# Patient Record
Sex: Male | Born: 1955 | Race: White | Hispanic: No | Smoking: Current every day smoker
Health system: Southern US, Community
[De-identification: ages and names within clinical notes are randomized; demographics above are authoritative.]

## PROBLEM LIST (undated history)

## (undated) DIAGNOSIS — R918 Other nonspecific abnormal finding of lung field: Secondary | ICD-10-CM

## (undated) DIAGNOSIS — Z720 Tobacco use: Secondary | ICD-10-CM

## (undated) HISTORY — PX: TONSILLECTOMY: SUR1361

---

## 2006-06-07 ENCOUNTER — Emergency Department (HOSPITAL_COMMUNITY): Admission: EM | Admit: 2006-06-07 | Discharge: 2006-06-07 | Payer: Self-pay | Admitting: Emergency Medicine

## 2018-03-19 ENCOUNTER — Encounter (HOSPITAL_COMMUNITY): Payer: Self-pay

## 2018-03-19 ENCOUNTER — Inpatient Hospital Stay (HOSPITAL_COMMUNITY)
Admission: EM | Admit: 2018-03-19 | Discharge: 2018-03-20 | DRG: 640 | Disposition: A | Discharge status: Home / Self Care | Payer: Self-pay | Attending: Family Medicine | Admitting: Family Medicine

## 2018-03-19 ENCOUNTER — Emergency Department (HOSPITAL_COMMUNITY): Payer: Self-pay

## 2018-03-19 DIAGNOSIS — R64 Cachexia: Secondary | ICD-10-CM | POA: Diagnosis present

## 2018-03-19 DIAGNOSIS — E872 Acidosis, unspecified: Secondary | ICD-10-CM | POA: Diagnosis present

## 2018-03-19 DIAGNOSIS — N179 Acute kidney failure, unspecified: Secondary | ICD-10-CM | POA: Diagnosis present

## 2018-03-19 DIAGNOSIS — F17210 Nicotine dependence, cigarettes, uncomplicated: Secondary | ICD-10-CM | POA: Diagnosis present

## 2018-03-19 DIAGNOSIS — R002 Palpitations: Secondary | ICD-10-CM | POA: Diagnosis present

## 2018-03-19 DIAGNOSIS — Z681 Body mass index (BMI) 19 or less, adult: Secondary | ICD-10-CM

## 2018-03-19 DIAGNOSIS — E860 Dehydration: Principal | ICD-10-CM | POA: Diagnosis present

## 2018-03-19 DIAGNOSIS — E43 Unspecified severe protein-calorie malnutrition: Secondary | ICD-10-CM | POA: Diagnosis present

## 2018-03-19 DIAGNOSIS — R627 Adult failure to thrive: Secondary | ICD-10-CM | POA: Diagnosis present

## 2018-03-19 DIAGNOSIS — D539 Nutritional anemia, unspecified: Secondary | ICD-10-CM | POA: Diagnosis present

## 2018-03-19 DIAGNOSIS — E875 Hyperkalemia: Secondary | ICD-10-CM | POA: Diagnosis present

## 2018-03-19 LAB — ETHANOL

## 2018-03-19 LAB — I-STAT CG4 LACTIC ACID, ED: LACTIC ACID, VENOUS: 3.72 mmol/L — AB (ref 0.5–1.9)

## 2018-03-19 LAB — CBC WITH DIFFERENTIAL/PLATELET
Basophils Absolute: 0 10*3/uL (ref 0.0–0.1)
Basophils Relative: 0 %
EOS PCT: 0 %
Eosinophils Absolute: 0 10*3/uL (ref 0.0–0.7)
HCT: 35.1 % — ABNORMAL LOW (ref 39.0–52.0)
Hemoglobin: 11 g/dL — ABNORMAL LOW (ref 13.0–17.0)
LYMPHS ABS: 0.7 10*3/uL (ref 0.7–4.0)
LYMPHS PCT: 12 %
MCH: 26.4 pg (ref 26.0–34.0)
MCHC: 31.3 g/dL (ref 30.0–36.0)
MCV: 84.2 fL (ref 78.0–100.0)
MONO ABS: 0.7 10*3/uL (ref 0.1–1.0)
Monocytes Relative: 10 %
Neutro Abs: 5 10*3/uL (ref 1.7–7.7)
Neutrophils Relative %: 78 %
PLATELETS: 288 10*3/uL (ref 150–400)
RBC: 4.17 MIL/uL — ABNORMAL LOW (ref 4.22–5.81)
RDW: 19.4 % — AB (ref 11.5–15.5)
WBC: 6.4 10*3/uL (ref 4.0–10.5)

## 2018-03-19 LAB — COMPREHENSIVE METABOLIC PANEL
ALT: 9 U/L (ref 0–44)
ANION GAP: 25 — AB (ref 5–15)
AST: 18 U/L (ref 15–41)
Albumin: 2.4 g/dL — ABNORMAL LOW (ref 3.5–5.0)
Alkaline Phosphatase: 91 U/L (ref 38–126)
BUN: 5 mg/dL — ABNORMAL LOW (ref 8–23)
CHLORIDE: 92 mmol/L — AB (ref 98–111)
CO2: 18 mmol/L — ABNORMAL LOW (ref 22–32)
Calcium: 9.3 mg/dL (ref 8.9–10.3)
Creatinine, Ser: 1.43 mg/dL — ABNORMAL HIGH (ref 0.61–1.24)
GFR, EST AFRICAN AMERICAN: 58 mL/min — AB (ref >60)
GFR, EST NON AFRICAN AMERICAN: 50 mL/min — AB (ref >60)
Glucose, Bld: 111 mg/dL — ABNORMAL HIGH (ref 70–99)
POTASSIUM: 5.8 mmol/L — AB (ref 3.5–5.1)
Sodium: 135 mmol/L (ref 135–145)
Total Bilirubin: 3.3 mg/dL — ABNORMAL HIGH (ref 0.3–1.2)
Total Protein: 7.9 g/dL (ref 6.5–8.1)

## 2018-03-19 LAB — TROPONIN I: Troponin I: <0.03 ng/mL (ref <0.03)

## 2018-03-19 MED ORDER — ONDANSETRON HCL 4 MG PO TABS: 4.0000 mg | ORAL_TABLET | Freq: Four times a day (QID) | ORAL | Status: DC | PRN

## 2018-03-19 MED ORDER — SODIUM CHLORIDE 0.9 % IV BOLUS
500.0000 mL | Freq: Once | INTRAVENOUS | Status: AC
  Administered 2018-03-19: 500 mL via INTRAVENOUS

## 2018-03-19 MED ORDER — DOCUSATE SODIUM 100 MG PO CAPS
100.0000 mg | ORAL_CAPSULE | Freq: Two times a day (BID) | ORAL | Status: DC
  Filled 2018-03-19: qty 1

## 2018-03-19 MED ORDER — SODIUM CHLORIDE 0.9 % IV SOLN
INTRAVENOUS | Status: DC
  Administered 2018-03-20 (×2): via INTRAVENOUS

## 2018-03-19 MED ORDER — THIAMINE HCL 100 MG/ML IJ SOLN
Freq: Once | INTRAVENOUS | Status: AC
  Administered 2018-03-20: via INTRAVENOUS
  Filled 2018-03-19: qty 1000

## 2018-03-19 MED ORDER — ONDANSETRON HCL 4 MG/2ML IJ SOLN: 4.0000 mg | Freq: Four times a day (QID) | INTRAMUSCULAR | Status: DC | PRN

## 2018-03-19 MED ORDER — SODIUM CHLORIDE 0.9 % IV BOLUS
1000.0000 mL | Freq: Once | INTRAVENOUS | Status: AC
  Administered 2018-03-19: 1000 mL via INTRAVENOUS

## 2018-03-19 MED ORDER — ENOXAPARIN SODIUM 30 MG/0.3ML ~~LOC~~ SOLN
30.0000 mg | SUBCUTANEOUS | Status: DC
  Administered 2018-03-20: 30 mg via SUBCUTANEOUS
  Filled 2018-03-19: qty 0.3

## 2018-03-19 NOTE — ED Triage Notes (Signed)
Pt arrived by EMS from home. Pt reports that for the past couple months he has lost a lot of weight and has been having weakness. Pt also reports loss of appetite. Pt alert and oriented on arrival. Pt denies having any pain at this time.

## 2018-03-19 NOTE — ED Provider Notes (Signed)
[facility] DEPT Provider Note   CSN: [id number] Arrival date & time: 03/19/18  1933     History   Chief Complaint Chief Complaint  Patient presents with  . Failure To Thrive    HPI Warren Blankenship is a 62 y.o. male.  HPI Patient presents with generalized weakness and 20 pound weight loss over the past few months.  States he has a cough which occasionally produces blood.  Denies any chest pain.  No definite shortness of breath.  Patient lives alone and does not have prior medical history.  Does report 1 pack/day smoking history. History reviewed. No pertinent past medical history.  Patient Active Problem List   Diagnosis Date Noted  . ARF (acute renal failure) ([facility]) 03/19/2018  . FTT (failure to thrive) in adult 03/19/2018  . Hyperkalemia 03/19/2018  . Cachexia ([facility]) 03/19/2018  . Lactic acidosis 03/19/2018  . Palpitation 03/19/2018    History reviewed. No pertinent surgical history.      Home Medications    Prior to Admission medications   Medication Sig Start Date End Date Taking? Authorizing Provider  naproxen sodium (ALEVE) 220 MG tablet Take 440 mg by mouth daily as needed (pain).   Yes [provider]    Family History History reviewed. No pertinent family history.  Social History Social History   Tobacco Use  . Smoking status: Current Every Day Smoker    Packs/day: 1.00  . Smokeless tobacco: Never Used  Substance Use Topics  . Alcohol use: Not Currently  . Drug use: Not Currently     Allergies   Patient has no known allergies.   Review of Systems Review of Systems  Constitutional: Positive for activity change, appetite change and fatigue. Negative for chills and fever.  HENT: Negative for sore throat and trouble swallowing.   Eyes: Negative for photophobia and visual disturbance.  Respiratory: Positive for cough. Negative for shortness of breath.   Cardiovascular: Negative for chest pain, palpitations  and leg swelling.  Gastrointestinal: Negative for abdominal pain, constipation, diarrhea, nausea and vomiting.  Genitourinary: Negative for dysuria, flank pain and frequency.  Musculoskeletal: Negative for back pain, myalgias, neck pain and neck stiffness.  Skin: Negative for rash and wound.  Neurological: Positive for weakness. Negative for dizziness, light-headedness, numbness and headaches.  All other systems reviewed and are negative.    Physical Exam Updated Vital Signs BP 92/73 (BP Location: Right Arm)   Pulse (!) 117   Temp 97.9 F (36.6 C) (Oral)   Resp 16   Ht 5\' 6"  (1.676 m)   Wt 45.4 kg   SpO2 100%   BMI 16.14 kg/m   Physical Exam  Constitutional: He is oriented to person, place, and time. He appears well-developed. No distress.  Cachectic and pale  HENT:  Head: Normocephalic and atraumatic.  Mouth/Throat: Oropharynx is clear and moist. No oropharyngeal exudate.  Eyes: Pupils are equal, round, and reactive to light. EOM are normal.  Neck: Normal range of motion. Neck supple. No JVD present.  Cardiovascular: Regular rhythm.  Tachycardia  Pulmonary/Chest: Effort normal.  Diminished breath sounds in the lung field with scattered rhonchi.  Abdominal: Soft. Bowel sounds are normal. There is no tenderness. There is no rebound and no guarding.  Musculoskeletal: Normal range of motion. He exhibits no edema or tenderness.  Lymphadenopathy:    He has no cervical adenopathy.  Neurological: He is alert and oriented to person, place, and time.  Moves all extremities without focal deficit.  Sensation intact.  Skin: Skin is warm and dry. Capillary refill takes less than 2 seconds. No rash noted. He is not diaphoretic. No erythema.  Psychiatric: He has a normal mood and affect. His behavior is normal.  Nursing note and vitals reviewed.    ED Treatments / Results  Labs (all labs ordered are listed, but only abnormal results are displayed) Labs Reviewed  CBC WITH  DIFFERENTIAL/PLATELET - Abnormal; Notable for the following components:      Result Value   RBC 4.17 (*)    Hemoglobin 11.0 (*)    HCT 35.1 (*)    RDW 19.4 (*)    All other components within normal limits  COMPREHENSIVE METABOLIC PANEL - Abnormal; Notable for the following components:   Potassium 5.8 (*)    Chloride 92 (*)    CO2 18 (*)    Glucose, Bld 111 (*)    BUN 5 (*)    Creatinine, Ser 1.43 (*)    Albumin 2.4 (*)    Total Bilirubin 3.3 (*)    GFR calc non Af Amer 50 (*)    GFR calc Af Amer 58 (*)    Anion gap 25 (*)    All other components within normal limits  I-STAT CG4 LACTIC ACID, ED - Abnormal; Notable for the following components:   Lactic Acid, Venous 3.72 (*)    All other components within normal limits  CULTURE, BLOOD (ROUTINE X 2)  CULTURE, BLOOD (ROUTINE X 2)  TROPONIN I  ETHANOL  RAPID URINE DRUG SCREEN, HOSP PERFORMED    EKG None  Radiology Dg Chest 2 View  Result Date: 03/19/2018 CLINICAL DATA:  Cough EXAM: CHEST - 2 VIEW COMPARISON:  None. FINDINGS: Hyperinflation with emphysematous disease. No focal airspace disease or effusion. Normal heart size. Aortic atherosclerosis. No pneumothorax IMPRESSION: Hyperinflation with emphysema.  No focal pulmonary infiltrate. Electronically Signed   By: Donavan Foil M.D.   On: 03/19/2018 20:55    Procedures Procedures (including critical care time)  Medications Ordered in ED Medications  sodium chloride 0.9 % bolus 1,000 mL (0 mLs Intravenous Stopped 03/19/18 2111)  sodium chloride 0.9 % bolus 500 mL (0 mLs Intravenous Stopped 03/19/18 2147)   CRITICAL CARE Performed by: Julianne Rice Total critical care time: 25 minutes Critical care time was exclusive of separately billable procedures and treating other patients. Critical care was necessary to treat or prevent imminent or life-threatening deterioration. Critical care was time spent personally by me on the following activities: development of treatment plan  with patient and/or surrogate as well as nursing, discussions with consultants, evaluation of patient's response to treatment, examination of patient, obtaining history from patient or surrogate, ordering and performing treatments and interventions, ordering and review of laboratory studies, ordering and review of radiographic studies, pulse oximetry and re-evaluation of patient's condition.  Initial Impression / Assessment and Plan / ED Course  I have reviewed the triage vital signs and the nursing notes.  Pertinent labs & imaging results that were available during my care of the patient were reviewed by me and considered in my medical decision making (see chart for details).     Patient appears severely malnourished.  Wasted appearing.  Elevated lactic acid.  Given 30 cc/kg of IV fluids.  Improvement of tachycardia and blood pressure.  Has normal white blood cell count.  No evidence of infectious process.  Think lactic acidosis most likely due to dehydration.  Suspicion for underlying cancer.  Discussed with hospitalist will see patient in  the emergency department and admit.  Final Clinical Impressions(s) / ED Diagnoses   Final diagnoses:  Dehydration  Lactic acidosis  Failure to thrive in adult    ED Discharge Orders    None       Julianne Rice, MD 03/19/18 2214

## 2018-03-19 NOTE — ED Notes (Signed)
ED Provider at bedside. 

## 2018-03-19 NOTE — ED Notes (Signed)
Bed: [facility] Expected date:  Expected time:  Means of arrival:  Comments: 

## 2018-03-19 NOTE — H&P (Signed)
History and Physical    Warren Blankenship ZDG:[id number] DOB: [birth year] DOA: 03/19/2018  Referring MD/NP/PA: Dr Lita Mains  PCP: Patient, No Pcp Per   Outpatient Specialists: None   Patient coming from: Home  Chief Complaint: Weakness and weight loss  HPI: Warren Blankenship is a 62 y.o. male with medical history significant of no known medical problems who presented with a 20 pound weight loss, significant weakness, progressive cough with shortness of breath, hemoptysis in the setting of chronic tobacco abuse.  Patient also has had poor oral intake and appears extremely dehydrated, malnourished and has lactic acidosis probably due to dehydration.  He has acute kidney injury as well as hyperkalemia with potassium of 5.8.  Patient is a poor historian and denied any past medical history.  Patient is dischargeable and appears to be chronically ill.  Patient's chest x-ray showed no evidence of any mass.  He is not hypoxic.  ED Course: Temperature is 98 with blood pressure 92/73 pulse 117 respirate of 16 oxygen sat 100% room air.  Chest x-ray is showing emphysema with no obvious infiltrate or mass.  White count is 6.4 hemoglobin 11.0 and platelet 288.  Sodium 135 potassium 5.8 chloride 92 CO2 of 18 with a BUN of 5 and creatinine 1.43 calcium is 9.3 and lactic acid of 3.72.  Patient has glucose of 111.  Review of Systems: As per HPI otherwise 10 point review of systems negative.   History reviewed. No pertinent past medical history.  History reviewed. No pertinent surgical history.   reports that he has been smoking. He has been smoking about 1.00 pack per day. He has never used smokeless tobacco. He reports that he drank alcohol. He reports that he has current or past drug history.  No Known Allergies  History reviewed. No pertinent family history.  Prior to Admission medications   Medication Sig Start Date End Date Taking? Authorizing Provider  naproxen sodium (ALEVE) 220 MG tablet Take 440 mg  by mouth daily as needed (pain).   Yes [provider]    Physical Exam: Vitals:   03/19/18 1957  BP: 92/73  Pulse: (!) 117  Resp: 16  Temp: 97.9 F (36.6 C)  TempSrc: Oral  SpO2: 100%  Weight: 45.4 kg  Height: 5\' 6"  (1.676 m)      Constitutional: NAD, calm, comfortable Vitals:   03/19/18 1957  BP: 92/73  Pulse: (!) 117  Resp: 16  Temp: 97.9 F (36.6 C)  TempSrc: Oral  SpO2: 100%  Weight: 45.4 kg  Height: 5\' 6"  (1.676 m)    Chronically ill looking, cachectic, very dry looking Eyes: PERRL, lids and conjunctivae normal, dry mucous membrane ENMT: Mucous membranes are moist. Posterior pharynx clear of any exudate or lesions.Normal dentition.  Neck: normal, supple, no masses, no thyromegaly Respiratory: clear to auscultation bilaterally, no wheezing, no crackles. Normal respiratory effort. No accessory muscle use.  Cardiovascular: Regular rate and rhythm, no murmurs / rubs / gallops. No extremity edema. 2+ pedal pulses. No carotid bruits.  Abdomen: no tenderness, no masses palpated. No hepatosplenomegaly. Bowel sounds positive.  Musculoskeletal: no clubbing / cyanosis. No joint deformity upper and lower extremities. Good ROM, no contractures. Normal muscle tone.  Skin: no rashes, lesions, ulcers. No induration Neurologic: CN 2-12 grossly intact. Sensation intact, DTR normal. Strength 5/5 in all 4.  Psychiatric: Normal judgment and insight. Alert and oriented x 3. Normal mood.   Labs on Admission: I have personally reviewed following labs and imaging studies  CBC:  Recent Labs  Lab 03/19/18 2000  WBC 6.4  NEUTROABS 5.0  HGB 11.0*  HCT 35.1*  MCV 84.2  PLT 762   Basic Metabolic Panel: Recent Labs  Lab 03/19/18 2000  NA 135  K 5.8*  CL 92*  CO2 18*  GLUCOSE 111*  BUN 5*  CREATININE 1.43*  CALCIUM 9.3   GFR: Estimated Creatinine Clearance: 33.5 mL/min (A) (by C-G formula based on SCr of 1.43 mg/dL (H)). Liver Function Tests: Recent Labs  Lab  03/19/18 2000  AST 18  ALT 9  ALKPHOS 91  BILITOT 3.3*  PROT 7.9  ALBUMIN 2.4*   No results for input(s): LIPASE, AMYLASE in the last 168 hours. No results for input(s): AMMONIA in the last 168 hours. Coagulation Profile: No results for input(s): INR, PROTIME in the last 168 hours. Cardiac Enzymes: Recent Labs  Lab 03/19/18 2000  TROPONINI <0.03   BNP (last 3 results) No results for input(s): PROBNP in the last 8760 hours. HbA1C: No results for input(s): HGBA1C in the last 72 hours. CBG: No results for input(s): GLUCAP in the last 168 hours. Lipid Profile: No results for input(s): CHOL, HDL, LDLCALC, TRIG, CHOLHDL, LDLDIRECT in the last 72 hours. Thyroid Function Tests: No results for input(s): TSH, T4TOTAL, FREET4, T3FREE, THYROIDAB in the last 72 hours. Anemia Panel: No results for input(s): VITAMINB12, FOLATE, FERRITIN, TIBC, IRON, RETICCTPCT in the last 72 hours. Urine analysis: No results found for: COLORURINE, APPEARANCEUR, LABSPEC, PHURINE, GLUCOSEU, HGBUR, BILIRUBINUR, KETONESUR, PROTEINUR, UROBILINOGEN, NITRITE, LEUKOCYTESUR Sepsis Labs: @LABRCNTIP (procalcitonin:4,lacticidven:4) )No results found for this or any previous visit (from the past 240 hour(s)).   Radiological Exams on Admission: Dg Chest 2 View  Result Date: 03/19/2018 CLINICAL DATA:  Cough EXAM: CHEST - 2 VIEW COMPARISON:  None. FINDINGS: Hyperinflation with emphysematous disease. No focal airspace disease or effusion. Normal heart size. Aortic atherosclerosis. No pneumothorax IMPRESSION: Hyperinflation with emphysema.  No focal pulmonary infiltrate. Electronically Signed   By: Donavan Foil M.D.   On: 03/19/2018 20:55    EKG: Independently reviewed.  It shows normal sinus rhythm with lateral ST flattening and some mild depressions but no old EKG to compare.  Possible LVH and RVH  Assessment/Plan Active Problems:   ARF (acute renal failure) ([facility])   FTT (failure to thrive) in adult   Hyperkalemia    Cachexia ([facility])   Lactic acidosis   Palpitation    #1 acute kidney injury: Most likely due to dehydration.  Patient appears very dry and so prerenal as a team is suspected.  We will aggressively hydrate the patient.  Monitor closely.  Patient has been taking nonsteroidal anti-inflammatory agents in the form of naproxen.  This could have contributed as well.  We will discontinue any nephrotoxic medications.  #2 hyperkalemia: potassium is 5.8 probably secondary to acute kidney injury.  Hydrate the patient recheck potassium level especially in the morning.  If is still elevated will give Kayexalate.  #3 profound cachexia: Patient reported a 20 pound weight loss in the last few months.  He could have some malignancy somewhere or poor oral intake could be contributing.  We will consider CT chest and abdomen to evaluate for this weight loss.  #4 lactic acidosis: Probably due to severe dehydration.  Continue to monitor  #5 normocytic anemia: Probably nutritional in nature.  Patient could still have malignancy somewhere.  We will monitor H&H especially after hydration.  #6 palpitations: Most likely due to dehydration.  Monitor heart rate was aggressive hydration.   DVT  prophylaxis: Lovenox Code Status: Full Code  Family Communication: No family available  Disposition Plan: To be determined  Consults called: None  Admission status: inpatient  Severity of Illness: The appropriate patient status for this patient is INPATIENT. Inpatient status is judged to be reasonable and necessary in order to provide the required intensity of service to ensure the patient's safety. The patient's presenting symptoms, physical exam findings, and initial radiographic and laboratory data in the context of their chronic comorbidities is felt to place them at high risk for further clinical deterioration. Furthermore, it is not anticipated that the patient will be medically stable for discharge from the hospital within 2  midnights of admission. The following factors support the patient status of inpatient.   " The patient's presenting symptoms include weakness and SOB. " The worrisome physical exam findings include cachexia, . " The initial radiographic and laboratory data are worrisome because of Lactic acid of 3.72. " The chronic co-morbidities include No known medical probl;ems.   * I certify that at the point of admission it is my clinical judgment that the patient will require inpatient hospital care spanning beyond 2 midnights from the point of admission due to high intensity of service, high risk for further deterioration and high frequency of surveillance required.Barbette Merino MD [facility] Pager [phone]  If 7PM-7AM, please contact night-coverage [email or web address] Password [email or web address]  03/19/2018, 10:09 PM

## 2018-03-20 DIAGNOSIS — E875 Hyperkalemia: Secondary | ICD-10-CM

## 2018-03-20 DIAGNOSIS — E872 Acidosis: Secondary | ICD-10-CM

## 2018-03-20 DIAGNOSIS — N179 Acute kidney failure, unspecified: Secondary | ICD-10-CM

## 2018-03-20 DIAGNOSIS — R634 Abnormal weight loss: Secondary | ICD-10-CM

## 2018-03-20 LAB — HIV ANTIBODY (ROUTINE TESTING W REFLEX): HIV Screen 4th Generation wRfx: NONREACTIVE

## 2018-03-20 LAB — TSH: TSH: 1.573 u[IU]/mL (ref 0.350–4.500)

## 2018-03-20 LAB — CBC
HEMATOCRIT: 29.1 % — AB (ref 39.0–52.0)
Hemoglobin: 9 g/dL — ABNORMAL LOW (ref 13.0–17.0)
MCH: 25.9 pg — ABNORMAL LOW (ref 26.0–34.0)
MCHC: 30.9 g/dL (ref 30.0–36.0)
MCV: 83.6 fL (ref 78.0–100.0)
Platelets: 259 10*3/uL (ref 150–400)
RBC: 3.48 MIL/uL — ABNORMAL LOW (ref 4.22–5.81)
RDW: 19.2 % — AB (ref 11.5–15.5)
WBC: 4.5 10*3/uL (ref 4.0–10.5)

## 2018-03-20 LAB — COMPREHENSIVE METABOLIC PANEL
ALBUMIN: 2 g/dL — AB (ref 3.5–5.0)
ALK PHOS: 77 U/L (ref 38–126)
ALT: 6 U/L (ref 0–44)
AST: 13 U/L — AB (ref 15–41)
Anion gap: 17 — ABNORMAL HIGH (ref 5–15)
BILIRUBIN TOTAL: 1.3 mg/dL — AB (ref 0.3–1.2)
BUN: 5 mg/dL — AB (ref 8–23)
CO2: 21 mmol/L — ABNORMAL LOW (ref 22–32)
Calcium: 8.5 mg/dL — ABNORMAL LOW (ref 8.9–10.3)
Chloride: 97 mmol/L — ABNORMAL LOW (ref 98–111)
Creatinine, Ser: 0.94 mg/dL (ref 0.61–1.24)
GFR calc Af Amer: >60 mL/min (ref >60)
GFR calc non Af Amer: >60 mL/min (ref >60)
GLUCOSE: 71 mg/dL (ref 70–99)
POTASSIUM: 2.9 mmol/L — AB (ref 3.5–5.1)
Sodium: 135 mmol/L (ref 135–145)
TOTAL PROTEIN: 7.1 g/dL (ref 6.5–8.1)

## 2018-03-20 MED ORDER — POTASSIUM CHLORIDE CRYS ER 20 MEQ PO TBCR
40.0000 meq | EXTENDED_RELEASE_TABLET | ORAL | Status: AC
  Administered 2018-03-20 (×2): 40 meq via ORAL
  Filled 2018-03-20 (×2): qty 2

## 2018-03-20 NOTE — Discharge Summary (Signed)
Physician Discharge Summary  Warren Blankenship  TWS:[id number]  DOB: 25-Apr-1956  DOA: 03/19/2018 PCP: Patient, No Pcp Per  Admit date: 03/19/2018 Discharge date: 03/20/2018  Admitted From: Home  Disposition:  Home   Recommendations for Outpatient Follow-up:  1. Follow up with PCP in 1 week to establish care and weight loss work up  2. Please obtain BMP/CBC in one week to monitor renal function and Hgb  3. Please follow up on the following pending results: Final blood cultures  4. Please arrange for outpatient colonoscopy and low-dose chest CT.  Discharge Condition: Stable   CODE STATUS: Full Code  Diet recommendation: Regular   Brief/Interim Summary: For full details see H&P/Progress note, but in brief, Warren Blankenship is a 62 year old male with no significant medical history presented to the emergency department complaining of weakness and weight loss.  Patient reported having 20 pound weight loss over the past 3-1/2 months and progressive shortness of breath with cough.  Patient report for oral intake and not hydrating himself properly.  Upon ED evaluation he appears to be extremely dehydrated, malnourished, lab work-up revealed lactic acidosis, hyperkalemia, elevated creatinine at 1.43, checks x-ray shows emphysema with no acute changes.  She was admitted with working diagnosis of AKI due to dehydration.  Subjective: Patient seen and examined, has no complaints, feeling much better. No acute events overnight. Cr normalized. K down and repleted.   Discharge Diagnoses/Hospital Course:  AKI with hyperkalemia In setting of poor oral intake Creatinine improved after IV hydration, potassium was low today after IV fluids, repleted with p.o. K Patient clinically improving. Avoid nephrotoxic agent, encourage oral hydration. Check BMP in 1 week to monitor renal function  Weight loss Unclear etiology at this time.  Patient has not seen a physician in many years.  Never had had an colonoscopy.   Patient is a smoker, however chest x-ray with no masses or visible nodules.  Patient will need outpatient work-up for weight loss including colonoscopy, low-dose chest CT.  Normocytic anemia Felt to be nutritional in nature, however will need further work-up as outpatient.  No signs of overt bleeding. Hemoglobin 9 upon discharge, hemodilution from aggressive IV fluid  Lactic acidosis Felt to be due to severe dehydration, resolved after IV fluids.  All other chronic medical condition were stable during the hospitalization.  On the day of the discharge the patient's vitals were stable, and no other acute medical condition were reported by patient. the patient was felt safe to be discharge to home.   Discharge Instructions  You were cared for by a hospitalist during your hospital stay. If you have any questions about your discharge medications or the care you received while you were in the hospital after you are discharged, you can call the unit and asked to speak with the hospitalist on call if the hospitalist that took care of you is not available. Once you are discharged, your primary care physician will handle any further medical issues. Please note that NO REFILLS for any discharge medications will be authorized once you are discharged, as it is imperative that you return to your primary care physician (or establish a relationship with a primary care physician if you do not have one) for your aftercare needs so that they can reassess your need for medications and monitor your lab values.  Discharge Instructions    Call MD for:  difficulty breathing, headache or visual disturbances   Complete by:  As directed    Call MD for:  extreme fatigue   Complete by:  As directed    Call MD for:  hives   Complete by:  As directed    Call MD for:  persistant dizziness or light-headedness   Complete by:  As directed    Call MD for:  persistant nausea and vomiting   Complete by:  As directed    Call MD  for:  redness, tenderness, or signs of infection (pain, swelling, redness, odor or green/yellow discharge around incision site)   Complete by:  As directed    Call MD for:  severe uncontrolled pain   Complete by:  As directed    Call MD for:  temperature >100.4   Complete by:  As directed    Diet - low sodium heart healthy   Complete by:  As directed    Increase activity slowly   Complete by:  As directed      Allergies as of 03/20/2018   No Known Allergies     Medication List    STOP taking these medications   naproxen sodium 220 MG tablet Commonly known as:  [place]. Schedule an appointment as soon as possible for a visit in 1 week(s).   Why:  Call to make an appointment as soon as possible  Contact information: [address] [place] [address] [phone]         No Known Allergies  Consultations:   Procedures/Studies: Dg Chest 2 View  Result Date: 03/19/2018 CLINICAL DATA:  Cough EXAM: CHEST - 2 VIEW COMPARISON:  None. FINDINGS: Hyperinflation with emphysematous disease. No focal airspace disease or effusion. Normal heart size. Aortic atherosclerosis. No pneumothorax IMPRESSION: Hyperinflation with emphysema.  No focal pulmonary infiltrate. Electronically Signed   By: Donavan Foil M.D.   On: 03/19/2018 20:55     Discharge Exam: Vitals:   03/19/18 2320 03/20/18 0509  BP: 110/75 90/60  Pulse: 82 82  Resp: 17 16  Temp: 98 F (36.7 C) 98.1 F (36.7 C)  SpO2: 100% 100%   Vitals:   03/19/18 2145 03/19/18 2215 03/19/18 2320 03/20/18 0509  BP: 121/82 115/76 110/75 90/60  Pulse:   82 82  Resp: 18 18 17 16   Temp:   98 F (36.7 C) 98.1 F (36.7 C)  TempSrc:   Oral Oral  SpO2:   100% 100%  Weight:      Height:        General: Cachetic, NAD   Cardiovascular: RRR, S1/S2 +, no rubs, no gallops Respiratory: CTA bilaterally, no wheezing, no rhonchi Abdominal:  Soft, NT, ND, + bowel sounds Extremities: no edema  The results of significant diagnostics from this hospitalization (including imaging, microbiology, ancillary and laboratory) are listed below for reference.     Microbiology: Recent Results (from the past 240 hour(s))  Culture, blood (Routine X 2) w Reflex to ID Panel     Status: None (Preliminary result)   Collection Time: 03/19/18  8:10 PM  Result Value Ref Range Status   Specimen Description   Final    BLOOD LEFT WRIST Performed at [facility] [address], [place], [place] [address]    Special Requests   Final    BOTTLES DRAWN AEROBIC AND ANAEROBIC Blood Culture results may not be optimal due to an inadequate volume of blood received in culture bottles Performed at [facility] [address] [place], [place]  [address]    Culture   Final    NO GROWTH < 24 HOURS Performed at [facility], [place] [address], [place], [place] [address]    Report Status PENDING  Incomplete  Culture, blood (Routine X 2) w Reflex to ID Panel     Status: None (Preliminary result)   Collection Time: 03/19/18  8:15 PM  Result Value Ref Range Status   Specimen Description   Final    BLOOD LEFT ANTECUBITAL Performed at [facility] [address] [place], [place] [address]    Special Requests   Final    BOTTLES DRAWN AEROBIC AND ANAEROBIC Blood Culture results may not be optimal due to an inadequate volume of blood received in culture bottles Performed at [facility] [address], [place], [place] [address]    Culture   Final    NO GROWTH < 24 HOURS Performed at [facility] [address], [place], [place] [address]    Report Status PENDING  Incomplete     Labs: BNP (last 3 results) No results for input(s): BNP in the last 8760 hours. Basic Metabolic Panel: Recent Labs  Lab 03/19/18 2000 03/20/18 0610  NA 135 135  K 5.8* 2.9*  CL 92* 97*   CO2 18* 21*  GLUCOSE 111* 71  BUN 5* 5*  CREATININE 1.43* 0.94  CALCIUM 9.3 8.5*   Liver Function Tests: Recent Labs  Lab 03/19/18 2000 03/20/18 0610  AST 18 13*  ALT 9 6  ALKPHOS 91 77  BILITOT 3.3* 1.3*  PROT 7.9 7.1  ALBUMIN 2.4* 2.0*   No results for input(s): LIPASE, AMYLASE in the last 168 hours. No results for input(s): AMMONIA in the last 168 hours. CBC: Recent Labs  Lab 03/19/18 2000 03/20/18 0610  WBC 6.4 4.5  NEUTROABS 5.0  --   HGB 11.0* 9.0*  HCT 35.1* 29.1*  MCV 84.2 83.6  PLT 288 259   Cardiac Enzymes: Recent Labs  Lab 03/19/18 2000  TROPONINI <0.03   BNP: Invalid input(s): POCBNP CBG: No results for input(s): GLUCAP in the last 168 hours. D-Dimer No results for input(s): DDIMER in the last 72 hours. Hgb A1c No results for input(s): HGBA1C in the last 72 hours. Lipid Profile No results for input(s): CHOL, HDL, LDLCALC, TRIG, CHOLHDL, LDLDIRECT in the last 72 hours. Thyroid function studies Recent Labs    03/20/18 0610  TSH 1.573   Anemia work up No results for input(s): VITAMINB12, FOLATE, FERRITIN, TIBC, IRON, RETICCTPCT in the last 72 hours. Urinalysis No results found for: COLORURINE, APPEARANCEUR, [place], [place], [place], [place], [place], [place], PROTEINUR, UROBILINOGEN, NITRITE, LEUKOCYTESUR Sepsis Labs Invalid input(s): PROCALCITONIN,  WBC,  LACTICIDVEN Microbiology Recent Results (from the past 240 hour(s))  Culture, blood (Routine X 2) w Reflex to ID Panel     Status: None (Preliminary result)   Collection Time: 03/19/18  8:10 PM  Result Value Ref Range Status   Specimen Description   Final    BLOOD LEFT WRIST Performed at [facility] [address] [place], [place] [address]    Special Requests   Final    BOTTLES DRAWN AEROBIC AND ANAEROBIC Blood Culture results may not be optimal due to an inadequate volume of blood received in culture bottles Performed at [facility] [address] [place], [place] [address]    Culture   Final    NO GROWTH < 24 HOURS Performed at [facility], [place] [address] [place], [place] [address]    Report Status PENDING  Incomplete  Culture, blood (Routine X 2) w Reflex to ID Panel     Status: None (Preliminary result)   Collection Time: 03/19/18  8:15 PM  Result Value Ref Range Status   Specimen Description   Final    BLOOD LEFT ANTECUBITAL Performed at [facility] [address] [place], [place] [address]    Special Requests   Final    BOTTLES DRAWN AEROBIC AND ANAEROBIC Blood Culture results may not be optimal due to an inadequate volume of blood received in culture bottles Performed at [facility] [address], [place], [place] [address]    Culture   Final    NO GROWTH < 24 HOURS Performed at [facility] [address], [place], [place] [address]    Report Status PENDING  Incomplete    Time coordinating discharge: 32 minutes  SIGNED:  Chipper Oman, MD  [facility] 03/20/2018, 2:01 PM  Pager please text page via  [email or web address]  Note - This record has been created using [employer]. Chart creation errors have been sought, but may not always have been located. Such creation errors do not reflect on the standard of medical care.

## 2018-03-20 NOTE — Care Management Note (Signed)
Case Management Note  Patient Details  Name: DEREL MCGLASSON MRN: [id number] Date of Birth: 29-Aug-1955  Subjective/Objective: [place] [facility] appt set-provided patient w/pcp listing-patient agreed to [facility]-provided w/community resources-[facility] med list. Reassured that he will get all medical attention @ [facility].Patient has own transp.                  Action/Plan:d/c home.   Expected Discharge Date:  03/20/18               Expected Discharge Plan:  Home/Self Care  In-House Referral:     Discharge planning Services  CM Consult, [facility], Medication Assistance  Post Acute Care Choice:    Choice offered to:     DME Arranged:    DME Agency:     HH Arranged:    HH Agency:     Status of Service:  Completed, signed off  If discussed at [employer], dates discussed:    Additional Comments:  Dessa Phi, RN 03/20/2018, 2:43 PM

## 2018-03-24 LAB — CULTURE, BLOOD (ROUTINE X 2)
CULTURE: NO GROWTH
CULTURE: NO GROWTH

## 2018-04-10 ENCOUNTER — Inpatient Hospital Stay: Payer: Self-pay | Admitting: Family Medicine

## 2018-04-11 ENCOUNTER — Other Ambulatory Visit: Payer: Self-pay

## 2018-04-11 ENCOUNTER — Emergency Department (HOSPITAL_COMMUNITY): Payer: Self-pay

## 2018-04-11 ENCOUNTER — Observation Stay (HOSPITAL_COMMUNITY)
Admission: EM | Admit: 2018-04-11 | Discharge: 2018-04-13 | Disposition: A | Discharge status: Home / Self Care | Payer: Self-pay | Attending: Internal Medicine | Admitting: Internal Medicine

## 2018-04-11 ENCOUNTER — Encounter (HOSPITAL_COMMUNITY): Payer: Self-pay | Admitting: Emergency Medicine

## 2018-04-11 DIAGNOSIS — R05 Cough: Secondary | ICD-10-CM

## 2018-04-11 DIAGNOSIS — F17210 Nicotine dependence, cigarettes, uncomplicated: Secondary | ICD-10-CM | POA: Insufficient documentation

## 2018-04-11 DIAGNOSIS — R634 Abnormal weight loss: Secondary | ICD-10-CM

## 2018-04-11 DIAGNOSIS — I959 Hypotension, unspecified: Secondary | ICD-10-CM | POA: Diagnosis present

## 2018-04-11 DIAGNOSIS — E876 Hypokalemia: Secondary | ICD-10-CM | POA: Insufficient documentation

## 2018-04-11 DIAGNOSIS — E871 Hypo-osmolality and hyponatremia: Secondary | ICD-10-CM | POA: Insufficient documentation

## 2018-04-11 DIAGNOSIS — R918 Other nonspecific abnormal finding of lung field: Principal | ICD-10-CM | POA: Insufficient documentation

## 2018-04-11 DIAGNOSIS — F17200 Nicotine dependence, unspecified, uncomplicated: Secondary | ICD-10-CM | POA: Diagnosis present

## 2018-04-11 DIAGNOSIS — D649 Anemia, unspecified: Secondary | ICD-10-CM | POA: Diagnosis present

## 2018-04-11 DIAGNOSIS — C342 Malignant neoplasm of middle lobe, bronchus or lung: Secondary | ICD-10-CM | POA: Diagnosis present

## 2018-04-11 DIAGNOSIS — J439 Emphysema, unspecified: Secondary | ICD-10-CM | POA: Insufficient documentation

## 2018-04-11 DIAGNOSIS — D75839 Thrombocytosis, unspecified: Secondary | ICD-10-CM | POA: Diagnosis present

## 2018-04-11 DIAGNOSIS — R630 Anorexia: Secondary | ICD-10-CM | POA: Insufficient documentation

## 2018-04-11 DIAGNOSIS — R059 Cough, unspecified: Secondary | ICD-10-CM

## 2018-04-11 DIAGNOSIS — D473 Essential (hemorrhagic) thrombocythemia: Secondary | ICD-10-CM | POA: Diagnosis present

## 2018-04-11 HISTORY — DX: Tobacco use: Z72.0

## 2018-04-11 LAB — BASIC METABOLIC PANEL
ANION GAP: 14 (ref 5–15)
BUN: 10 mg/dL (ref 8–23)
CALCIUM: 8.6 mg/dL — AB (ref 8.9–10.3)
CO2: 23 mmol/L (ref 22–32)
Chloride: 96 mmol/L — ABNORMAL LOW (ref 98–111)
Creatinine, Ser: 1.05 mg/dL (ref 0.61–1.24)
GFR calc Af Amer: >60 mL/min (ref >60)
GLUCOSE: 156 mg/dL — AB (ref 70–99)
POTASSIUM: 4.9 mmol/L (ref 3.5–5.1)
Sodium: 133 mmol/L — ABNORMAL LOW (ref 135–145)

## 2018-04-11 MED ORDER — SODIUM CHLORIDE 0.9 % IV BOLUS
1000.0000 mL | Freq: Once | INTRAVENOUS | Status: AC
  Administered 2018-04-11: 1000 mL via INTRAVENOUS

## 2018-04-11 NOTE — ED Triage Notes (Signed)
Per EMS[place] [facility] where staff reports near syncopal episode. Recent admission for dehydration. Hypoglycemic and hypotensive upon EMS arrival. Denies pain, N/V.  CBG 106 BP 85/51 95% RA

## 2018-04-11 NOTE — ED Notes (Signed)
Bed: FI43 Expected date:  Expected time:  Means of arrival:  Comments: EMS male hypoglycemic,hypotensive,tachycardia-no previous hx

## 2018-04-11 NOTE — ED Notes (Signed)
ED Provider at bedside. 

## 2018-04-11 NOTE — ED Provider Notes (Signed)
[facility] DEPT Provider Note   CSN: [id number] Arrival date & time: 04/11/18  2143     History   Chief Complaint Chief Complaint  Patient presents with  . Hypotension    HPI Warren Blankenship is a 62 y.o. male.  HPI   He states he was walking to a store, from his apartment when his legs felt weak so he sat down.  Able to get up.  A bystander called EMS who brought him here.  He was discharged from the hospital about 3 weeks ago after admission for dehydration.  He has not followed up with a doctor since that discharge.  He was not prescribed any medications.  He states he is not currently drinking alcohol but continues to smoke cigarettes.  He denies use of illegal drugs.  There is been no fever, chills, chest pain, nausea or vomiting.  He occasionally coughs and produces sputum which is clear.  He denies focal weakness or paresthesia.  There are no other known modifying factors.  History reviewed. No pertinent past medical history.  Patient Active Problem List   Diagnosis Date Noted  . ARF (acute renal failure) ([place]) 03/19/2018  . FTT (failure to thrive) in adult 03/19/2018  . Hyperkalemia 03/19/2018  . 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 Lactic acidosis 03/19/2018  . Palpitation 03/19/2018    History reviewed. No pertinent surgical history.      Home Medications    Prior to Admission medications   Not on File    Family History No family history on file.  Social History Social History   Tobacco Use  . Smoking status: Current Every Day Smoker    Packs/day: 1.00  . Smokeless tobacco: Never Used  Substance Use Topics  . Alcohol use: Not Currently  . Drug use: Not Currently     Allergies   Patient has no known allergies.   Review of Systems Review of Systems  All other systems reviewed and are negative.    Physical Exam Updated Vital Signs BP 101/63   Pulse 95   Temp 97.9 F (36.6 C) (Oral)   Resp (!) 27   SpO2 100%    Physical Exam  Constitutional: He is oriented to person, place, and time. He appears well-developed.  He appears under nourished.  HENT:  Head: Normocephalic and atraumatic.  Right Ear: External ear normal.  Left Ear: External ear normal.  Eyes: Pupils are equal, round, and reactive to light. Conjunctivae and EOM are normal.  Neck: Normal range of motion and phonation normal. Neck supple.  Cardiovascular: Normal rate, regular rhythm and normal heart sounds.  Pulmonary/Chest: Effort normal and breath sounds normal. He exhibits no bony tenderness.  Abdominal: Soft. There is no tenderness.  Musculoskeletal: Normal range of motion.  Neurological: He is alert and oriented to person, place, and time. No cranial nerve deficit or sensory deficit. He exhibits normal muscle tone. Coordination normal.  No dysarthria or aphasia.  Skin: Skin is warm, dry and intact.  Psychiatric: He has a normal mood and affect. His behavior is normal. Judgment and thought content normal.  Nursing note and vitals reviewed.    ED Treatments / Results  Labs (all labs ordered are listed, but only abnormal results are displayed) Labs Reviewed  BASIC METABOLIC PANEL - Abnormal; Notable for the following components:      Result Value   Sodium 133 (*)    Chloride 96 (*)    Glucose, Bld 156 (*)  Calcium 8.6 (*)    All other components within normal limits  CBC WITH DIFFERENTIAL/PLATELET    EKG None  Radiology Dg Chest 2 View  Result Date: 04/11/2018 CLINICAL DATA:  Cough EXAM: CHEST - 2 VIEW COMPARISON:  03/19/2018 FINDINGS: Hyperinflated lungs. No focal airspace disease or effusion. Stable cardiomediastinal silhouette with aortic atherosclerosis. Enlarged right hilar vessels. No pneumothorax. IMPRESSION: Hyperinflation. Asymmetric right hilar fullness could be due to enlarged hilar vessels. Chest CT could obtain to exclude hilar mass. No acute pulmonary infiltrate. Electronically Signed   By: Donavan Foil  M.D.   On: 04/11/2018 23:10    Procedures Procedures (including critical care time)  Medications Ordered in ED Medications  sodium chloride 0.9 % bolus 1,000 mL (1,000 mLs Intravenous New Bag/Given 04/11/18 2302)     Initial Impression / Assessment and Plan / ED Course  I have reviewed the triage vital signs and the nursing notes.  Pertinent labs & imaging results that were available during my care of the patient were reviewed by me and considered in my medical decision making (see chart for details).  Clinical Course as of Apr 11 2358  Thu Apr 11, 2018  2352 No infiltrate or CHF, nonspecific right hilar enlargement  DG Chest 2 View [EW]    Clinical Course User Index [EW] Daleen Bo, MD     Patient Vitals for the past 24 hrs:  BP Temp Temp src Pulse Resp SpO2  04/11/18 2329 101/63 - - 95 (!) 27 100 %  04/11/18 2230 (!) 100/57 - - 99 (!) 27 97 %  04/11/18 2202 (!) 77/56 97.9 F (36.6 C) Oral (!) 106 15 98 %    11:57 PM Reevaluation with update and discussion. After initial assessment and treatment, an updated evaluation reveals he has been able to tolerate some oral fluids and now wants to try some crackers.  He was updated on findings and plan. Daleen Bo   Medical Decision Making: Weakness with hypotension, nonspecific likely related to gait decreased oral intake.  Labs ordered to evaluate for recurrent hypokalemia.  Chest x-ray did not show pneumonia, but did show right hilar mass requiring CT imaging to evaluate for cancer.  CRITICAL CARE-no Performed by: Daleen Bo   Nursing Notes Reviewed/ Care Coordinated Applicable Imaging Reviewed Interpretation of Laboratory Data incorporated into ED treatment   Plan-as per oncoming provider team, to evaluate after return of imaging and labs    Final Clinical Impressions(s) / ED Diagnoses   Final diagnoses:  Hypotension, unspecified hypotension type  Anorexia  Cough    ED Discharge Orders    None         Daleen Bo, MD 04/11/18 2359

## 2018-04-12 ENCOUNTER — Observation Stay (HOSPITAL_COMMUNITY): Payer: Self-pay

## 2018-04-12 ENCOUNTER — Emergency Department (HOSPITAL_COMMUNITY): Payer: Self-pay

## 2018-04-12 ENCOUNTER — Other Ambulatory Visit: Payer: Self-pay

## 2018-04-12 ENCOUNTER — Encounter (HOSPITAL_COMMUNITY): Payer: Self-pay | Admitting: Internal Medicine

## 2018-04-12 DIAGNOSIS — D473 Essential (hemorrhagic) thrombocythemia: Secondary | ICD-10-CM

## 2018-04-12 DIAGNOSIS — Z720 Tobacco use: Secondary | ICD-10-CM

## 2018-04-12 DIAGNOSIS — R918 Other nonspecific abnormal finding of lung field: Secondary | ICD-10-CM

## 2018-04-12 DIAGNOSIS — F17200 Nicotine dependence, unspecified, uncomplicated: Secondary | ICD-10-CM | POA: Diagnosis present

## 2018-04-12 DIAGNOSIS — D75839 Thrombocytosis, unspecified: Secondary | ICD-10-CM | POA: Diagnosis present

## 2018-04-12 DIAGNOSIS — I959 Hypotension, unspecified: Secondary | ICD-10-CM | POA: Diagnosis present

## 2018-04-12 DIAGNOSIS — C342 Malignant neoplasm of middle lobe, bronchus or lung: Secondary | ICD-10-CM | POA: Diagnosis present

## 2018-04-12 DIAGNOSIS — D649 Anemia, unspecified: Secondary | ICD-10-CM | POA: Diagnosis present

## 2018-04-12 LAB — CBC WITH DIFFERENTIAL/PLATELET
BASOS ABS: 0 10*3/uL (ref 0.0–0.1)
BASOS PCT: 0 %
EOS ABS: 0 10*3/uL (ref 0.0–0.7)
Eosinophils Relative: 0 %
HCT: 27.6 % — ABNORMAL LOW (ref 39.0–52.0)
HEMOGLOBIN: 8.4 g/dL — AB (ref 13.0–17.0)
Lymphocytes Relative: 12 %
Lymphs Abs: 0.8 10*3/uL (ref 0.7–4.0)
MCH: 26.2 pg (ref 26.0–34.0)
MCHC: 30.4 g/dL (ref 30.0–36.0)
MCV: 86 fL (ref 78.0–100.0)
MONOS PCT: 8 %
Monocytes Absolute: 0.5 10*3/uL (ref 0.1–1.0)
NEUTROS PCT: 80 %
Neutro Abs: 5.7 10*3/uL (ref 1.7–7.7)
Platelets: 475 10*3/uL — ABNORMAL HIGH (ref 150–400)
RBC: 3.21 MIL/uL — AB (ref 4.22–5.81)
RDW: 17.5 % — ABNORMAL HIGH (ref 11.5–15.5)
WBC: 7.1 10*3/uL (ref 4.0–10.5)

## 2018-04-12 LAB — PHOSPHORUS: PHOSPHORUS: 3 mg/dL (ref 2.5–4.6)

## 2018-04-12 LAB — CBC
HEMATOCRIT: 26 % — AB (ref 39.0–52.0)
Hemoglobin: 8.3 g/dL — ABNORMAL LOW (ref 13.0–17.0)
MCH: 26.6 pg (ref 26.0–34.0)
MCHC: 31.9 g/dL (ref 30.0–36.0)
MCV: 83.3 fL (ref 78.0–100.0)
Platelets: 422 10*3/uL — ABNORMAL HIGH (ref 150–400)
RBC: 3.12 MIL/uL — ABNORMAL LOW (ref 4.22–5.81)
RDW: 17.1 % — AB (ref 11.5–15.5)
WBC: 6 10*3/uL (ref 4.0–10.5)

## 2018-04-12 LAB — BLOOD GAS, ARTERIAL
Acid-Base Excess: 3.2 mmol/L — ABNORMAL HIGH (ref 0.0–2.0)
Bicarbonate: 26.3 mmol/L (ref 20.0–28.0)
Drawn by: 441261
FIO2: 21
O2 Saturation: 95.1 %
PH ART: 7.478 — AB (ref 7.350–7.450)
Patient temperature: 98.6
pCO2 arterial: 36 mmHg (ref 32.0–48.0)
pO2, Arterial: 76.8 mmHg — ABNORMAL LOW (ref 83.0–108.0)

## 2018-04-12 LAB — TROPONIN I

## 2018-04-12 LAB — LACTIC ACID, PLASMA: Lactic Acid, Venous: 0.8 mmol/L (ref 0.5–1.9)

## 2018-04-12 LAB — MAGNESIUM: Magnesium: 1.6 mg/dL — ABNORMAL LOW (ref 1.7–2.4)

## 2018-04-12 LAB — BASIC METABOLIC PANEL
ANION GAP: 11 (ref 5–15)
BUN: 8 mg/dL (ref 8–23)
CALCIUM: 8.2 mg/dL — AB (ref 8.9–10.3)
CO2: 25 mmol/L (ref 22–32)
Chloride: 96 mmol/L — ABNORMAL LOW (ref 98–111)
Creatinine, Ser: 0.74 mg/dL (ref 0.61–1.24)
Glucose, Bld: 98 mg/dL (ref 70–99)
Potassium: 3.8 mmol/L (ref 3.5–5.1)
SODIUM: 132 mmol/L — AB (ref 135–145)

## 2018-04-12 LAB — PROTIME-INR
INR: 1.24
PROTHROMBIN TIME: 15.5 s — AB (ref 11.4–15.2)

## 2018-04-12 MED ORDER — IOPAMIDOL (ISOVUE-300) INJECTION 61%
100.0000 mL | Freq: Once | INTRAVENOUS | Status: AC | PRN
  Administered 2018-04-12: 100 mL via INTRAVENOUS

## 2018-04-12 MED ORDER — SODIUM CHLORIDE 0.9 % IV SOLN
INTRAVENOUS | Status: DC
  Administered 2018-04-12 (×3): via INTRAVENOUS

## 2018-04-12 MED ORDER — ENOXAPARIN SODIUM 30 MG/0.3ML ~~LOC~~ SOLN
30.0000 mg | SUBCUTANEOUS | Status: DC
  Administered 2018-04-13: 30 mg via SUBCUTANEOUS
  Filled 2018-04-12: qty 0.3

## 2018-04-12 MED ORDER — SODIUM CHLORIDE 0.9 % IV BOLUS
1000.0000 mL | INTRAVENOUS | Status: AC
  Administered 2018-04-12: 1000 mL via INTRAVENOUS

## 2018-04-12 MED ORDER — MAGNESIUM SULFATE 2 GM/50ML IV SOLN
2.0000 g | Freq: Once | INTRAVENOUS | Status: AC
  Administered 2018-04-12: 2 g via INTRAVENOUS
  Filled 2018-04-12: qty 50

## 2018-04-12 MED ORDER — ONDANSETRON HCL 4 MG/2ML IJ SOLN: 4.0000 mg | Freq: Four times a day (QID) | INTRAMUSCULAR | Status: DC | PRN

## 2018-04-12 MED ORDER — GUAIFENESIN ER 600 MG PO TB12
600.0000 mg | ORAL_TABLET | Freq: Two times a day (BID) | ORAL | Status: DC
  Administered 2018-04-12 – 2018-04-13 (×2): 600 mg via ORAL
  Filled 2018-04-12 (×3): qty 1

## 2018-04-12 MED ORDER — ACETAMINOPHEN 650 MG RE SUPP: 650.0000 mg | Freq: Four times a day (QID) | RECTAL | Status: DC | PRN

## 2018-04-12 MED ORDER — NICOTINE 21 MG/24HR TD PT24
21.0000 mg | MEDICATED_PATCH | Freq: Every day | TRANSDERMAL | Status: DC
  Administered 2018-04-13: 21 mg via TRANSDERMAL
  Filled 2018-04-12 (×2): qty 1

## 2018-04-12 MED ORDER — ACETAMINOPHEN 325 MG PO TABS: 650.0000 mg | ORAL_TABLET | Freq: Four times a day (QID) | ORAL | Status: DC | PRN

## 2018-04-12 MED ORDER — SODIUM CHLORIDE 0.9% FLUSH
3.0000 mL | Freq: Two times a day (BID) | INTRAVENOUS | Status: DC
  Administered 2018-04-12 (×2): 3 mL via INTRAVENOUS

## 2018-04-12 MED ORDER — ENOXAPARIN SODIUM 40 MG/0.4ML ~~LOC~~ SOLN
40.0000 mg | SUBCUTANEOUS | Status: DC
  Filled 2018-04-12: qty 0.4

## 2018-04-12 MED ORDER — ADULT MULTIVITAMIN W/MINERALS CH
1.0000 | ORAL_TABLET | Freq: Every day | ORAL | Status: DC
  Administered 2018-04-13: 1 via ORAL
  Filled 2018-04-12: qty 1

## 2018-04-12 MED ORDER — IPRATROPIUM-ALBUTEROL 0.5-2.5 (3) MG/3ML IN SOLN: 3.0000 mL | RESPIRATORY_TRACT | Status: DC | PRN

## 2018-04-12 MED ORDER — ONDANSETRON HCL 4 MG PO TABS: 4.0000 mg | ORAL_TABLET | Freq: Four times a day (QID) | ORAL | Status: DC | PRN

## 2018-04-12 MED ORDER — POTASSIUM CHLORIDE CRYS ER 20 MEQ PO TBCR
40.0000 meq | EXTENDED_RELEASE_TABLET | Freq: Once | ORAL | Status: AC
  Administered 2018-04-12: 40 meq via ORAL
  Filled 2018-04-12: qty 2

## 2018-04-12 MED ORDER — ENSURE ENLIVE PO LIQD
237.0000 mL | Freq: Two times a day (BID) | ORAL | Status: DC
  Administered 2018-04-13: 237 mL via ORAL

## 2018-04-12 MED ORDER — GADOBUTROL 1 MMOL/ML IV SOLN
7.5000 mL | Freq: Once | INTRAVENOUS | Status: AC | PRN
  Administered 2018-04-12: 4.5 mL via INTRAVENOUS

## 2018-04-12 NOTE — Progress Notes (Signed)
Initial Nutrition Assessment  DOCUMENTATION CODES:   Underweight(Will assess for malnutrition at follow-up. )  INTERVENTION:  - Will order Ensure Enlive po BID, each supplement provides 350 kcal and 20 grams of protein - Will order daily multivitamin with minerals.  - Continue to encourage PO intakes.   NUTRITION DIAGNOSIS:   Inadequate oral intake related to acute illness as evidenced by per patient/family report, other (comment)(RN report.).  GOAL:   Patient will meet greater than or equal to 90% of their needs  MONITOR:   PO intake, Supplement acceptance, Weight trends, Labs, I & O's  REASON FOR ASSESSMENT:   Other (Comment)(underweight BMI)  ASSESSMENT:   62 y.o. male with medical history significant of tobacco and alcohol abuse; who presents with complaints of weakness. He had just recently been hospitalized from 8/20-8/21 for dehydration with reports of at least a 20 pound weight loss.  During hospitalization patient was found to have acute kidney injury, hyperkalemia, and lactic acidosis. Since being home patient reports still having poor appetite because food has a bad taste.  Reports eating very small amount every other day. Associated symptoms include productive cough.  He reports smoking 1 pack of cigarettes on average per day since about the age of 62.  He previously reported history of alcohol abuse, but has not drank in several months.  No intakes documented since admission. Patient sleeping and did not arouse to name call x5. No family/visitors present. Spoke with RN who reports that patient has refused food each time offered and has not had anything to eat today. Unable to perform NFPE, but patient is very cachectic in appearance. Per chart review, current weight is 98 lb and patient weighed 100 lb on 8/20. This indicates 2 lb weight loss (2% body weight) in 1 month. No weight hx available prior to 8/20.   Medications reviewed. Labs reviewed; Na: 132 mmol/L, Cl: 96  mmol/L, Ca: 8.2 mg/dL. IVF; NS @ 75 mL/hr.     NUTRITION - FOCUSED PHYSICAL EXAM:  Unable to perform d/t patient did not arouse and no family/visitors present.  Diet Order:   Diet Order            Diet regular Room service appropriate? Yes; Fluid consistency: Thin  Diet effective now              EDUCATION NEEDS:   Not appropriate for education at this time  Skin:  Skin Assessment: Reviewed RN Assessment  Last BM:  9/9  Height:   Ht Readings from Last 1 Encounters:  04/12/18 5\' 7"  (1.702 m)    Weight:   Wt Readings from Last 1 Encounters:  04/12/18 44.6 kg    Ideal Body Weight:  67.27 kg  BMI:  Body mass index is 15.4 kg/m.  Estimated Nutritional Needs:   Kcal:  1560-1785 (35-40 kcal/kg)  Protein:  70-80 grams  Fluid:  >/= 1.6 L/day     Jarome Matin, MS, RD, LDN, [facility] Inpatient Clinical Dietitian Pager # [phone] After hours/weekend pager # [phone]

## 2018-04-12 NOTE — H&P (Addendum)
History and Physical    Warren Blankenship:[id number] DOB: [birth year] DOA: 04/11/2018  Referring MD/NP/PA: Shanon Rosser PCP: Patient, No Pcp Per  Patient coming from: Via EMS  Chief Complaint: Weakness  I have personally briefly reviewed patient's old medical records in [place]   HPI: Warren Blankenship is a 62 y.o. male with medical history significant of tobacco and alcohol abuse; who presents with complaints of weakness.  He had just recently been hospitalized from 8/20-8/21 for dehydration with reports of at least a 20 pound weight loss.  During hospitalization patient was found to have acute kidney injury, hyperkalemia, and lactic acidosis which she was given IV fluids with improvement of symptoms.  A CT scan of the chest was considered, but not performed given clear chest x-ray.  Since being home patient reports still having poor appetite because food has a bad taste.  Reports eating very small amount every other day. Associated symptoms include productive cough.  Cough worsens when he lays down.  He reports smoking 1 pack of cigarettes on average per day since about the age of 66.  Denies having any shortness of breath, chest pain, hemoptysis, abdominal pain, nausea, vomiting, diarrhea, blood in stool/urine.  While he was walking to the store tyesterday he lost his balance, but was caught by someone.  Denies hitting his head or loss of consciousness.  He previously reported history of alcohol abuse, but has not drank in several months.  EMS was called by the bystander due to his condition.  ED Course: Upon admission to the emergency department patient was noted to be afebrile, pulse 88-106, respiration 1531, blood pressure 77/56 improving to 112/71 on after 1 L of fluid.  Chest x-ray revealed hyperinflation with asymmetric right hilar fullness.  CT scan of the chest revealed large centralized mass concerning for malignancy.  TRH called to admit.  Review of Systems  Constitutional:  Positive for malaise/fatigue and weight loss. Negative for diaphoresis and fever.  HENT: Negative for ear discharge and nosebleeds.   Eyes: Negative for photophobia and pain.  Respiratory: Positive for cough. Negative for hemoptysis, shortness of breath and wheezing.   Cardiovascular: Negative for chest pain and leg swelling.  Gastrointestinal: Negative for abdominal pain, blood in stool, diarrhea, nausea and vomiting.  Genitourinary: Negative for dysuria and hematuria.  Musculoskeletal: Negative for joint pain and myalgias.  Neurological: Positive for weakness. Negative for focal weakness and loss of consciousness.  Psychiatric/Behavioral: Negative for memory loss. The patient is not nervous/anxious.     History reviewed. No pertinent past medical history.  History reviewed. No pertinent surgical history.   reports that he has been smoking. He has been smoking about 1.00 pack per day. He has never used smokeless tobacco. He reports that he drank alcohol. He reports that he has current or past drug history.  No Known Allergies  History reviewed. No pertinent family history.  Prior to Admission medications   Not on File    Physical Exam:  Constitutional: Cachectic elderly male in no acute distress at this time. Vitals:   04/12/18 0315 04/12/18 0330 04/12/18 0400 04/12/18 0430  BP: 101/69 94/67 100/70 100/64  Pulse: 90 89 93 91  Resp: (!) 31 (!) 27 (!) 24 (!) 23  Temp:      TempSrc:      SpO2: 95% 95% 94% 97%   Eyes: PERRL, lids and conjunctivae normal ENMT: Mucous membranes are dry. Posterior pharynx clear of any exudate or lesions. Poor dentition.  Neck: normal, supple, no masses, no thyromegaly Respiratory:  Decreased overall aeration with no significant wheezes appreciated at this time.  Patient able to talk in complete sentences.  O2 saturation maintained on room air.   Cardiovascular: Regular rate and rhythm, no murmurs / rubs / gallops. No extremity edema. 2+ pedal  pulses. No carotid bruits.  Abdomen: no tenderness, no masses palpated. No hepatosplenomegaly. Bowel sounds positive.  Musculoskeletal: no clubbing / cyanosis. No joint deformity upper and lower extremities. Good ROM, no contractures. Normal muscle tone.  Skin: no rashes, lesions, ulcers. No induration Neurologic: CN 2-12 grossly intact. Sensation intact, DTR normal. Strength 5/5 in all 4.  Psychiatric: Normal judgment and insight. Alert and oriented x 3. Normal mood.     Labs on Admission: I have personally reviewed following labs and imaging studies  CBC: Recent Labs  Lab 04/11/18 2300  WBC 7.1  NEUTROABS 5.7  HGB 8.4*  HCT 27.6*  MCV 86.0  PLT 580*   Basic Metabolic Panel: Recent Labs  Lab 04/11/18 2300  NA 133*  K 4.9  CL 96*  CO2 23  GLUCOSE 156*  BUN 10  CREATININE 1.05  CALCIUM 8.6*   GFR: CrCl cannot be calculated (Unknown ideal weight.). Liver Function Tests: No results for input(s): AST, ALT, ALKPHOS, BILITOT, PROT, ALBUMIN in the last 168 hours. No results for input(s): LIPASE, AMYLASE in the last 168 hours. No results for input(s): AMMONIA in the last 168 hours. Coagulation Profile: No results for input(s): INR, PROTIME in the last 168 hours. Cardiac Enzymes: No results for input(s): CKTOTAL, CKMB, CKMBINDEX, TROPONINI in the last 168 hours. BNP (last 3 results) No results for input(s): PROBNP in the last 8760 hours. HbA1C: No results for input(s): HGBA1C in the last 72 hours. CBG: No results for input(s): GLUCAP in the last 168 hours. Lipid Profile: No results for input(s): CHOL, HDL, LDLCALC, TRIG, CHOLHDL, LDLDIRECT in the last 72 hours. Thyroid Function Tests: No results for input(s): TSH, T4TOTAL, FREET4, T3FREE, THYROIDAB in the last 72 hours. Anemia Panel: No results for input(s): VITAMINB12, FOLATE, FERRITIN, TIBC, IRON, RETICCTPCT in the last 72 hours. Urine analysis: No results found for: COLORURINE, APPEARANCEUR, LABSPEC, PHURINE,  GLUCOSEU, HGBUR, BILIRUBINUR, KETONESUR, PROTEINUR, UROBILINOGEN, NITRITE, LEUKOCYTESUR Sepsis Labs: No results found for this or any previous visit (from the past 240 hour(s)).   Radiological Exams on Admission: Dg Chest 2 View  Result Date: 04/11/2018 CLINICAL DATA:  Cough EXAM: CHEST - 2 VIEW COMPARISON:  03/19/2018 FINDINGS: Hyperinflated lungs. No focal airspace disease or effusion. Stable cardiomediastinal silhouette with aortic atherosclerosis. Enlarged right hilar vessels. No pneumothorax. IMPRESSION: Hyperinflation. Asymmetric right hilar fullness could be due to enlarged hilar vessels. Chest CT could obtain to exclude hilar mass. No acute pulmonary infiltrate. Electronically Signed   By: Donavan Foil M.D.   On: 04/11/2018 23:10   Ct Chest W Contrast  Result Date: 04/12/2018 CLINICAL DATA:  Right hilar fullness on x-ray. Cough. Current smoker. EXAM: CT CHEST WITH CONTRAST TECHNIQUE: Multidetector CT imaging of the chest was performed during intravenous contrast administration. CONTRAST:  165mL ISOVUE-300 IOPAMIDOL (ISOVUE-300) INJECTION 61% COMPARISON:  Radiograph earlier this day. FINDINGS: Cardiovascular: Aortic atherosclerosis without aneurysm. The heart is normal in size. There are coronary artery calcifications. 14 mm soft tissue density anterior to the right ventricle may be loculated pericardial fluid versus adenopathy. Mediastinum/Nodes: Contiguous right hilar soft tissue density likely combination of central mass and hilar adenopathy is contiguous with anterior paratracheal adenopathy, with soft tissue conglomerate measuring  8.1 x 4.0 x 6.6 cm. This causes mass effect on the right mainstem and right middle lobe bronchus. Additional right inferior hilar adenopathy with node measuring 15 mm in the infrahilar region. 14 mm soft tissue density anterior to the right ventricle may be epicardial adenopathy or loculated pericardial fluid. No left hilar adenopathy. No supraclavicular adenopathy.  Thyroid gland is diminutive. The esophagus is nondistended. Lungs/Pleura: Right hilar soft tissue density likely combination of central pulmonary lesion and enlarged lymph nodes. Adjacent micronodularity extends into the right upper and right middle lobes with associated septal thickening. There tree in bud opacities extending into the right upper lobe. 4 mm right apical nodule image 39 series 7. Moderate emphysema and central bronchial thickening. Dependent opacity in the right lower lobe likely atelectasis. No pleural fluid. Upper Abdomen: No adrenal mass. 12 mm soft tissue nodule in the left upper quadrant adjacent to the splenic flexure of the colon has similar density to the adjacent spleen and is likely a splenule but nonspecific. No focal hepatic mass. Musculoskeletal: No blastic or destructive lytic osseous lesions. Remote left lateral rib fractures. Prominent Schmorl's node in the superior endplate of T7. paucity of subcutaneous fat suggesting cachexia. IMPRESSION: 1. Abnormal right hilar soft tissue density likely combination of central lung mass and adenopathy, appears contiguous with anterior paratracheal adenopathy. This conglomerate soft tissue measures 8.1 x 4.0 x 6.6 cm, unable to differentiate primary malignancy from adenopathy. There is adjacent septal thickening and micronodularity which may be lymphangitic spread of tumor. This causes mass effect on the right mainstem and right middle lobe bronchi. 2. Soft tissue density anterior to the right ventricle may be loculated pericardial fluid or epicardial adenopathy. 3. Tree in bud opacity in the right upper and to lesser extent right middle lobe may be infectious, inflammatory, postobstructive or malignant. Nonspecific 4 mm right apical pulmonary nodule. 4. Moderate emphysema and central bronchial thickening. Aortic Atherosclerosis (ICD10-I70.0) and Emphysema (ICD10-J43.9). Electronically Signed   By: Keith Rake M.D.   On: 04/12/2018 01:18       Assessment/Plan Lung mass: Acute.  Patient reports having a chronic cough with clear sputum production. - Admit to a telemetry bed - Consider consults to interventional radiology/pulmonology to obtain possible biopsy - Social work consult  Transient hypotension: Resolved.  Initial blood pressure noted to be as low as 77/56, but improved after 1 L of normal saline IV fluids.  - Normal saline IV fluids at 75 mL/h  Normocytic normochromic anemia: Hemoglobin 8.4 on admission.  Previously hemoglobin 9 at discharge on 8/20.  Patient reports no complaints of bleeding or dark stools. - Recheck CBC  Thrombocytosis: Acute.  Initial platelet count 475.   Tobacco abuse: Patient admits to 50+ smoking pack-year history. - Counseled on need of cessation of tobacco use - Nicotine patch offered  DVT prophylaxis: Lovenox Code Status: Full Family Communication: No family present bedside Disposition Plan: TBD Consults called: none  Admission status: observation  Norval Morton MD [facility] Pager [phone]   If 7PM-7AM, please contact night-coverage [email or web address] Password [facility]  04/12/2018, 4:47 AM

## 2018-04-12 NOTE — Plan of Care (Signed)
62 year old male mid to this morning with generalized weakness found to have a lung mass.  He was hypotensive on admission which has been resolved with IV fluids.  Patient seen by PCCM.  Patient to be seen by Dr. Sharma Covert today.  Endobronchial ultrasound has been scheduled for next Tuesday.  His appetite remains poor with decreased p.o. intake continue IV fluids recheck labs tomorrow.  His magnesium level was low potassium low repleted.  We will plan discharge tomorrow to follow-up with [facility] next Tuesday.

## 2018-04-12 NOTE — Assessment & Plan Note (Signed)
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 However, PET can only be done in opd and there are delays getting it - recommend EBUS under anesthesia - he is on schedule for tuesday9/17/19 at approx 13.30h byt Dr Carney Corners  - who will independently assess patient today  - MEanwile, check ABG, LFT, lactate, trop, mag, phos and INR - continue copd nebs - informed PFT lab that this procedure will likely be outpateint  PCCM will sign off

## 2018-04-12 NOTE — H&P (View-Only) (Signed)
Warren Blankenship  ZGY:[id number] DOB: [birth year] DOA: 04/11/2018 PCP: Patient, No Pcp Per    LOS: 0 days   Reason for Consult / Chief Complaint:  Lung mass  Consulting MD and date:  Dr Narda Bonds 04/12/2018   HPI/Summary of hospital stay:  Warren Blankenship , 62 y.o. , with dob 1956/07/01 and male ,Not Hispanic or Latino from [address] [place] [address] - presents to hosoptal for issues of weakness, dyspnea, cough, and occ/rare scanty hemop[tysis and 20 poind weight loss. All ongoig for few to sevral months and progressive. Severe in itnensity. CT revealed Rt large lung mass with extensive RB 4 and Station 7 subcarinal enlargement. His BMI is 16. Pulmonary called for consult     has a past medical history of Tobacco abuse.   reports that he has been smoking. He has been smoking about 1.00 pack per day. He has never used smokeless tobacco.  History reviewed. No pertinent surgical history.  No Known Allergies   There is no immunization history on file for this patient.  History reviewed. No pertinent family history.   Current Facility-Administered Medications:  .  0.9 %  sodium chloride infusion, , Intravenous, Continuous, Smith, Rondell A, MD, Last Rate: 75 mL/hr at 04/12/18 1000 .  acetaminophen (TYLENOL) tablet 650 mg, 650 mg, Oral, Q6H PRN **OR** acetaminophen (TYLENOL) suppository 650 mg, 650 mg, Rectal, Q6H PRN, Norval Morton, MD .  Derrill Memo ON 04/13/2018] enoxaparin (LOVENOX) injection 30 mg, 30 mg, Subcutaneous, Q24H, Georgette Shell, MD .  feeding supplement (ENSURE ENLIVE) (ENSURE ENLIVE) liquid 237 mL, 237 mL, Oral, BID BM, Georgette Shell, MD .  guaiFENesin (MUCINEX) 12 hr tablet 600 mg, 600 mg, Oral, BID, Smith, Rondell A, MD .  ipratropium-albuterol (DUONEB) 0.5-2.5 (3) MG/3ML nebulizer solution 3 mL, 3 mL, Nebulization, Q4H PRN, Tamala Julian, Rondell A, MD .  multivitamin with minerals tablet 1 tablet, 1 tablet, Oral, Daily, Georgette Shell, MD .  nicotine (NICODERM CQ - dosed in mg/24 hours) patch 21 mg, 21 mg, Transdermal, Daily, Smith, Rondell A, MD .  ondansetron (ZOFRAN) tablet 4 mg, 4 mg, Oral, Q6H PRN **OR** ondansetron (ZOFRAN) injection 4 mg, 4 mg, Intravenous, Q6H PRN, Smith, Rondell A, MD .  sodium chloride flush (NS) 0.9 % injection 3 mL, 3 mL, Intravenous, Q12H, Smith, Rondell A, MD, 3 mL at 04/12/18 1051      Subjective:  04/12/2018 - resting  Objective   Blood pressure 104/77, pulse 90, temperature 99 F (37.2 C), temperature source Oral, resp. rate 16, height 5\' 7"  (1.702 m), weight 44.6 kg, SpO2 99 %.        Intake/Output Summary (Last 24 hours) at 04/12/2018 1314 Last data filed at 04/12/2018 1000 Gross per 24 hour  Intake 1276.45 ml  Output -  Net 1276.45 ml   Filed Weights   04/12/18 0627  Weight: 44.6 kg    Examination: General Appearance:  Frail, disheveled, cachectic, low BMI Head:  Normocephalic, without obvious abnormality, atraumatic Eyes:  PERRL - yes, conjunctiva/corneas - clear     Ears:  Normal external ear canals, both ears Nose:  G tube - no Throat:  ETT TUBE - no , OG tube - no Neck:  Supple,  No enlargement/tenderness/nodules. Unable to feel supraclav nodes Lungs: Clear to auscultation bilaterally,  Heart:  S1 and S2 normal, no murmur, CVP - no.  Pressors - no Abdomen:  Soft, no masses, no organomegaly Genitalia /  Rectal:  Not done Extremities:  Extremities- intact Skin:  ntact in exposed areas . Sacral area - intact Neurologic:  Sedation - none -> RASS - +1 . Moves all 4s - yes. CAM-ICU - neg . Orientation - x3 +     Labs    PULMONARY No results for input(s): PHART, PCO2ART, PO2ART, HCO3, TCO2, O2SAT in the last 168 hours.  Invalid input(s): PCO2, PO2  CBC Recent Labs  Lab 04/11/18 2300 04/12/18 0636  HGB 8.4* 8.3*  HCT 27.6* 26.0*  WBC 7.1 6.0  PLT 475* 422*    COAGULATION No results for input(s): INR in the last 168 hours.  CARDIAC  No results for  input(s): TROPONINI in the last 168 hours. No results for input(s): PROBNP in the last 168 hours.   CHEMISTRY Recent Labs  Lab 04/11/18 2300 04/12/18 0636  NA 133* 132*  K 4.9 3.8  CL 96* 96*  CO2 23 25  GLUCOSE 156* 98  BUN 10 8  CREATININE 1.05 0.74  CALCIUM 8.6* 8.2*   Estimated Creatinine Clearance: 58.8 mL/min (by C-G formula based on SCr of 0.74 mg/dL).   LIVER No results for input(s): AST, ALT, ALKPHOS, BILITOT, PROT, ALBUMIN, INR in the last 168 hours.   INFECTIOUS No results for input(s): LATICACIDVEN, PROCALCITON in the last 168 hours.   ENDOCRINE CBG (last 3)  No results for input(s): GLUCAP in the last 72 hours.       IMAGING x48h  - image(s) personally visualized  -   highlighted in bold Dg Chest 2 View  Result Date: 04/11/2018 CLINICAL DATA:  Cough EXAM: CHEST - 2 VIEW COMPARISON:  03/19/2018 FINDINGS: Hyperinflated lungs. No focal airspace disease or effusion. Stable cardiomediastinal silhouette with aortic atherosclerosis. Enlarged right hilar vessels. No pneumothorax. IMPRESSION: Hyperinflation. Asymmetric right hilar fullness could be due to enlarged hilar vessels. Chest CT could obtain to exclude hilar mass. No acute pulmonary infiltrate. Electronically Signed   By: Donavan Foil M.D.   On: 04/11/2018 23:10   Ct Chest W Contrast  Result Date: 04/12/2018 CLINICAL DATA:  Right hilar fullness on x-ray. Cough. Current smoker. EXAM: CT CHEST WITH CONTRAST TECHNIQUE: Multidetector CT imaging of the chest was performed during intravenous contrast administration. CONTRAST:  169mL ISOVUE-300 IOPAMIDOL (ISOVUE-300) INJECTION 61% COMPARISON:  Radiograph earlier this day. FINDINGS: Cardiovascular: Aortic atherosclerosis without aneurysm. The heart is normal in size. There are coronary artery calcifications. 14 mm soft tissue density anterior to the right ventricle may be loculated pericardial fluid versus adenopathy. Mediastinum/Nodes: Contiguous right hilar soft  tissue density likely combination of central mass and hilar adenopathy is contiguous with anterior paratracheal adenopathy, with soft tissue conglomerate measuring 8.1 x 4.0 x 6.6 cm. This causes mass effect on the right mainstem and right middle lobe bronchus. Additional right inferior hilar adenopathy with node measuring 15 mm in the infrahilar region. 14 mm soft tissue density anterior to the right ventricle may be epicardial adenopathy or loculated pericardial fluid. No left hilar adenopathy. No supraclavicular adenopathy. Thyroid gland is diminutive. The esophagus is nondistended. Lungs/Pleura: Right hilar soft tissue density likely combination of central pulmonary lesion and enlarged lymph nodes. Adjacent micronodularity extends into the right upper and right middle lobes with associated septal thickening. There tree in bud opacities extending into the right upper lobe. 4 mm right apical nodule image 39 series 7. Moderate emphysema and central bronchial thickening. Dependent opacity in the right lower lobe likely atelectasis. No pleural fluid. Upper Abdomen: No adrenal mass. 12 mm  soft tissue nodule in the left upper quadrant adjacent to the splenic flexure of the colon has similar density to the adjacent spleen and is likely a splenule but nonspecific. No focal hepatic mass. Musculoskeletal: No blastic or destructive lytic osseous lesions. Remote left lateral rib fractures. Prominent Schmorl's node in the superior endplate of T7. paucity of subcutaneous fat suggesting cachexia. IMPRESSION: 1. Abnormal right hilar soft tissue density likely combination of central lung mass and adenopathy, appears contiguous with anterior paratracheal adenopathy. This conglomerate soft tissue measures 8.1 x 4.0 x 6.6 cm, unable to differentiate primary malignancy from adenopathy. There is adjacent septal thickening and micronodularity which may be lymphangitic spread of tumor. This causes mass effect on the right mainstem and  right middle lobe bronchi. 2. Soft tissue density anterior to the right ventricle may be loculated pericardial fluid or epicardial adenopathy. 3. Tree in bud opacity in the right upper and to lesser extent right middle lobe may be infectious, inflammatory, postobstructive or malignant. Nonspecific 4 mm right apical pulmonary nodule. 4. Moderate emphysema and central bronchial thickening. Aortic Atherosclerosis (ICD10-I70.0) and Emphysema (ICD10-J43.9). Electronically Signed   By: Keith Rake M.D.   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 However, PET can only be done in opd and there are delays getting it - recommend EBUS under anesthesia - he is on schedule for tuesday9/17/19 at approx 13.30h byt Dr Carney Corners  - who will independently assess patient today  - MEanwile, check ABG, LFT, lactate, trop, mag, phos and INR - continue copd nebs - informed PFT lab that this procedure will likely be outpateint  PCCM will sign off    Disposition / Summary of Today's Plan 04/12/18   Per triad.   BEST PRACTICE  DVT prophylaxis: per triad GI prophylaxis: per triad Diet: per triad Mobility:per triad Code Status: full Family Communication: patient updatd. He lives alone . Rest of famly in [place]. He has transport to come back    SIGNATURE    Dr. Brand Males, M.D., F.C.C.P,  Pulmonary and Critical Care Medicine Staff Physician, [place] Director - Interstitial Lung Disease  Program  P[facility] [place] [place], [place], [address]  Pager: [phone], If no answer or between  15:00h - 7:00h: call [phone] Telephone: [phone]  1:15 PM 04/12/2018

## 2018-04-12 NOTE — Progress Notes (Addendum)
   Subjective: This is a 62 y.o., male admitted on 04/11/2018 for weightloss, weakness, CT imaging found to have large central lung mass with mild compression of SVC with associated lymphadenopathy.   Objective: BP (!) 94/58 (BP Location: Right Arm)   Pulse 91   Temp 99.1 F (37.3 C) (Oral)   Resp 18   Ht 5\' 7"  (1.702 m)   Wt 44.6 kg   SpO2 96%   BMI 15.40 kg/m    Intake/Output Summary (Last 24 hours) at 04/12/2018 1816 Last data filed at 04/12/2018 1717 Gross per 24 hour  Intake 1276.45 ml  Output 300 ml  Net 976.45 ml     Physical Examination: Gen: Thin, cachetic male, NAD  Neck: distended neck veins Ext: Distend, right cephalic vein and veins of the BL UE Chest: Diminished bilaterally, no wheeze Abd: soft, NT, flat  Neuro: alert, oriented, following commands   Recent Labs  Lab 04/11/18 2300 04/12/18 0636  NA 133* 132*  K 4.9 3.8  CL 96* 96*  CO2 23 25  BUN 10 8  CREATININE 1.05 0.74  GLUCOSE 156* 98   Recent Labs  Lab 04/11/18 2300 04/12/18 0636  WBC 7.1 6.0  HGB 8.4* 8.3*  HCT 27.6* 26.0*  PLT 475* 422*   Recent Labs  Lab 04/12/18 1345  INR 1.24   Recent Labs  Lab 04/12/18 1345  LATICACIDVEN 0.8   Recent Labs  Lab 04/12/18 1402  PHART 7.478*  PCO2ART 36.0  PO2ART 76.8*   Recent Labs  Lab 04/12/18 1345  TROPONINI <0.03    Chest Imaging:  CT Imaging:  Large obstructing central mass. 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  Would prefer to do this as soon as possible however is scheduled on Tuesday at 1:30pm which is our first available endoscopy slot.  He will need PET imaging (outpatient) as well as MRI Head w/wo contrast (inpatient?) Fluid status remains important due to SVC compression (euvolemia preferred)  He will need to be seen by radiation oncology If possible could obtain MRI while in hospital to facilitate staging.  It might be nice if he could meet them while in the hospital.  I will rounding here at [facility] on Monday.    Please page on-call pulmonary for any questions this weekend.   Will discuss with Dr. Zigmund Daniel.   Garner Nash, DO [facility] 04/12/2018 6:16 PM  Personal pager: [phone] If unanswered, please page CCM On-call: [phone]   I spoke with Dr. Zigmund Daniel. I placed the order for the MRI.  If there is evidence of brain involvement would definitely keep in-hospital until biopsy. Would discuss with radiation oncology to review the images prior to discharge   Garner Nash, [facility] 04/12/2018 6:57 PM

## 2018-04-12 NOTE — Plan of Care (Signed)
All of pt's questions being answered

## 2018-04-12 NOTE — ED Provider Notes (Signed)
Nursing notes and vitals signs, including pulse oximetry, reviewed.  Summary of this visit's results, reviewed by myself:  EKG:  EKG Interpretation  Date/Time:    Ventricular Rate:    PR Interval:    QRS Duration:   QT Interval:    QTC Calculation:   R Axis:     Text Interpretation:         Labs:  Results for orders placed or performed during the hospital encounter of 04/11/18 (from the past 24 hour(s))  Basic metabolic panel     Status: Abnormal   Collection Time: 04/11/18 11:00 PM  Result Value Ref Range   Sodium 133 (L) 135 - 145 mmol/L   Potassium 4.9 3.5 - 5.1 mmol/L   Chloride 96 (L) 98 - 111 mmol/L   CO2 23 22 - 32 mmol/L   Glucose, Bld 156 (H) 70 - 99 mg/dL   BUN 10 8 - 23 mg/dL   Creatinine, Ser 1.05 0.61 - 1.24 mg/dL   Calcium 8.6 (L) 8.9 - 10.3 mg/dL   GFR calc non Af Amer >60 >60 mL/min   GFR calc Af Amer >60 >60 mL/min   Anion gap 14 5 - 15  CBC with Differential     Status: Abnormal   Collection Time: 04/11/18 11:00 PM  Result Value Ref Range   WBC 7.1 4.0 - 10.5 K/uL   RBC 3.21 (L) 4.22 - 5.81 MIL/uL   Hemoglobin 8.4 (L) 13.0 - 17.0 g/dL   HCT 27.6 (L) 39.0 - 52.0 %   MCV 86.0 78.0 - 100.0 fL   MCH 26.2 26.0 - 34.0 pg   MCHC 30.4 30.0 - 36.0 g/dL   RDW 17.5 (H) 11.5 - 15.5 %   Platelets 475 (H) 150 - 400 K/uL   Neutrophils Relative % 80 %   Neutro Abs 5.7 1.7 - 7.7 K/uL   Lymphocytes Relative 12 %   Lymphs Abs 0.8 0.7 - 4.0 K/uL   Monocytes Relative 8 %   Monocytes Absolute 0.5 0.1 - 1.0 K/uL   Eosinophils Relative 0 %   Eosinophils Absolute 0.0 0.0 - 0.7 K/uL   Basophils Relative 0 %   Basophils Absolute 0.0 0.0 - 0.1 K/uL    Imaging Studies: Dg Chest 2 View  Result Date: 04/11/2018 CLINICAL DATA:  Cough EXAM: CHEST - 2 VIEW COMPARISON:  03/19/2018 FINDINGS: Hyperinflated lungs. No focal airspace disease or effusion. Stable cardiomediastinal silhouette with aortic atherosclerosis. Enlarged right hilar vessels. No pneumothorax. IMPRESSION:  Hyperinflation. Asymmetric right hilar fullness could be due to enlarged hilar vessels. Chest CT could obtain to exclude hilar mass. No acute pulmonary infiltrate. Electronically Signed   By: Donavan Foil M.D.   On: 04/11/2018 23:10   Ct Chest W Contrast  Result Date: 04/12/2018 CLINICAL DATA:  Right hilar fullness on x-ray. Cough. Current smoker. EXAM: CT CHEST WITH CONTRAST TECHNIQUE: Multidetector CT imaging of the chest was performed during intravenous contrast administration. CONTRAST:  144mL ISOVUE-300 IOPAMIDOL (ISOVUE-300) INJECTION 61% COMPARISON:  Radiograph earlier this day. FINDINGS: Cardiovascular: Aortic atherosclerosis without aneurysm. The heart is normal in size. There are coronary artery calcifications. 14 mm soft tissue density anterior to the right ventricle may be loculated pericardial fluid versus adenopathy. Mediastinum/Nodes: Contiguous right hilar soft tissue density likely combination of central mass and hilar adenopathy is contiguous with anterior paratracheal adenopathy, with soft tissue conglomerate measuring 8.1 x 4.0 x 6.6 cm. This causes mass effect on the right mainstem and right middle lobe bronchus. Additional right  inferior hilar adenopathy with node measuring 15 mm in the infrahilar region. 14 mm soft tissue density anterior to the right ventricle may be epicardial adenopathy or loculated pericardial fluid. No left hilar adenopathy. No supraclavicular adenopathy. Thyroid gland is diminutive. The esophagus is nondistended. Lungs/Pleura: Right hilar soft tissue density likely combination of central pulmonary lesion and enlarged lymph nodes. Adjacent micronodularity extends into the right upper and right middle lobes with associated septal thickening. There tree in bud opacities extending into the right upper lobe. 4 mm right apical nodule image 39 series 7. Moderate emphysema and central bronchial thickening. Dependent opacity in the right lower lobe likely atelectasis. No  pleural fluid. Upper Abdomen: No adrenal mass. 12 mm soft tissue nodule in the left upper quadrant adjacent to the splenic flexure of the colon has similar density to the adjacent spleen and is likely a splenule but nonspecific. No focal hepatic mass. Musculoskeletal: No blastic or destructive lytic osseous lesions. Remote left lateral rib fractures. Prominent Schmorl's node in the superior endplate of T7. paucity of subcutaneous fat suggesting cachexia. IMPRESSION: 1. Abnormal right hilar soft tissue density likely combination of central lung mass and adenopathy, appears contiguous with anterior paratracheal adenopathy. This conglomerate soft tissue measures 8.1 x 4.0 x 6.6 cm, unable to differentiate primary malignancy from adenopathy. There is adjacent septal thickening and micronodularity which may be lymphangitic spread of tumor. This causes mass effect on the right mainstem and right middle lobe bronchi. 2. Soft tissue density anterior to the right ventricle may be loculated pericardial fluid or epicardial adenopathy. 3. Tree in bud opacity in the right upper and to lesser extent right middle lobe may be infectious, inflammatory, postobstructive or malignant. Nonspecific 4 mm right apical pulmonary nodule. 4. Moderate emphysema and central bronchial thickening. Aortic Atherosclerosis (ICD10-I70.0) and Emphysema (ICD10-J43.9). Electronically Signed   By: Keith Rake M.D.   On: 04/12/2018 01:18   2:28 AM Patient advised of CT findings suspicious for malignancy.  We will have him admitted to the hospitalist service.    Shanon Rosser, MD 04/12/18 [id number]

## 2018-04-12 NOTE — Consult Note (Signed)
Warren Blankenship  [name] DOB: 1956/07/04 DOA: 04/11/2018 PCP: Patient, No Pcp Per    LOS: 0 days   Reason for Consult / Chief Complaint:  Lung mass  Consulting MD and date:  Dr Narda Bonds 04/12/2018   HPI/Summary of hospital stay:  Warren Blankenship , 62 y.o. , with dob 1955/11/19 and male ,Not Hispanic or Latino from [address] [place] [address] - presents to hosoptal for issues of weakness, dyspnea, cough, and occ/rare scanty hemop[tysis and 20 poind weight loss. All ongoig for few to sevral months and progressive. Severe in itnensity. CT revealed Rt large lung mass with extensive RB 4 and Station 7 subcarinal enlargement. His BMI is 16. Pulmonary called for consult     has a past medical history of Tobacco abuse.   reports that he has been smoking. He has been smoking about 1.00 pack per day. He has never used smokeless tobacco.  History reviewed. No pertinent surgical history.  No Known Allergies   There is no immunization history on file for this patient.  History reviewed. No pertinent family history.   Current Facility-Administered Medications:  .  0.9 %  sodium chloride infusion, , Intravenous, Continuous, Smith, Rondell A, MD, Last Rate: 75 mL/hr at 04/12/18 1000 .  acetaminophen (TYLENOL) tablet 650 mg, 650 mg, Oral, Q6H PRN **OR** acetaminophen (TYLENOL) suppository 650 mg, 650 mg, Rectal, Q6H PRN, Norval Morton, MD .  Derrill Memo ON 04/13/2018] enoxaparin (LOVENOX) injection 30 mg, 30 mg, Subcutaneous, Q24H, Georgette Shell, MD .  feeding supplement (ENSURE ENLIVE) (ENSURE ENLIVE) liquid 237 mL, 237 mL, Oral, BID BM, Georgette Shell, MD .  guaiFENesin (MUCINEX) 12 hr tablet 600 mg, 600 mg, Oral, BID, Smith, Rondell A, MD .  ipratropium-albuterol (DUONEB) 0.5-2.5 (3) MG/3ML nebulizer solution 3 mL, 3 mL, Nebulization, Q4H PRN, Tamala Julian, Rondell A, MD .  multivitamin with minerals tablet 1 tablet, 1 tablet, Oral, Daily, Georgette Shell, MD .  nicotine (NICODERM CQ - dosed in mg/24 hours) patch 21 mg, 21 mg, Transdermal, Daily, Smith, Rondell A, MD .  ondansetron (ZOFRAN) tablet 4 mg, 4 mg, Oral, Q6H PRN **OR** ondansetron (ZOFRAN) injection 4 mg, 4 mg, Intravenous, Q6H PRN, Smith, Rondell A, MD .  sodium chloride flush (NS) 0.9 % injection 3 mL, 3 mL, Intravenous, Q12H, Smith, Rondell A, MD, 3 mL at 04/12/18 1051      Subjective:  04/12/2018 - resting  Objective   Blood pressure 104/77, pulse 90, temperature 99 F (37.2 C), temperature source Oral, resp. rate 16, height 5\' 7"  (1.702 m), weight 44.6 kg, SpO2 99 %.        Intake/Output Summary (Last 24 hours) at 04/12/2018 1314 Last data filed at 04/12/2018 1000 Gross per 24 hour  Intake 1276.45 ml  Output -  Net 1276.45 ml   Filed Weights   04/12/18 0627  Weight: 44.6 kg    Examination: General Appearance:  Frail, disheveled, cachectic, low BMI Head:  Normocephalic, without obvious abnormality, atraumatic Eyes:  PERRL - yes, conjunctiva/corneas - clear     Ears:  Normal external ear canals, both ears Nose:  G tube - no Throat:  ETT TUBE - no , OG tube - no Neck:  Supple,  No enlargement/tenderness/nodules. Unable to feel supraclav nodes Lungs: Clear to auscultation bilaterally,  Heart:  S1 and S2 normal, no murmur, CVP - no.  Pressors - no Abdomen:  Soft, no masses, no organomegaly Genitalia /  Rectal:  Not done Extremities:  Extremities- intact Skin:  ntact in exposed areas . Sacral area - intact Neurologic:  Sedation - none -> RASS - +1 . Moves all 4s - yes. CAM-ICU - neg . Orientation - x3 +     Labs    PULMONARY No results for input(s): PHART, PCO2ART, PO2ART, HCO3, TCO2, O2SAT in the last 168 hours.  Invalid input(s): PCO2, PO2  CBC Recent Labs  Lab 04/11/18 2300 04/12/18 0636  HGB 8.4* 8.3*  HCT 27.6* 26.0*  WBC 7.1 6.0  PLT 475* 422*    COAGULATION No results for input(s): INR in the last 168 hours.  CARDIAC  No results for  input(s): TROPONINI in the last 168 hours. No results for input(s): PROBNP in the last 168 hours.   CHEMISTRY Recent Labs  Lab 04/11/18 2300 04/12/18 0636  NA 133* 132*  K 4.9 3.8  CL 96* 96*  CO2 23 25  GLUCOSE 156* 98  BUN 10 8  CREATININE 1.05 0.74  CALCIUM 8.6* 8.2*   Estimated Creatinine Clearance: 58.8 mL/min (by C-G formula based on SCr of 0.74 mg/dL).   LIVER No results for input(s): AST, ALT, ALKPHOS, BILITOT, PROT, ALBUMIN, INR in the last 168 hours.   INFECTIOUS No results for input(s): LATICACIDVEN, PROCALCITON in the last 168 hours.   ENDOCRINE CBG (last 3)  No results for input(s): GLUCAP in the last 72 hours.       IMAGING x48h  - image(s) personally visualized  -   highlighted in bold Dg Chest 2 View  Result Date: 04/11/2018 CLINICAL DATA:  Cough EXAM: CHEST - 2 VIEW COMPARISON:  03/19/2018 FINDINGS: Hyperinflated lungs. No focal airspace disease or effusion. Stable cardiomediastinal silhouette with aortic atherosclerosis. Enlarged right hilar vessels. No pneumothorax. IMPRESSION: Hyperinflation. Asymmetric right hilar fullness could be due to enlarged hilar vessels. Chest CT could obtain to exclude hilar mass. No acute pulmonary infiltrate. Electronically Signed   By: Donavan Foil M.D.   On: 04/11/2018 23:10   Ct Chest W Contrast  Result Date: 04/12/2018 CLINICAL DATA:  Right hilar fullness on x-ray. Cough. Current smoker. EXAM: CT CHEST WITH CONTRAST TECHNIQUE: Multidetector CT imaging of the chest was performed during intravenous contrast administration. CONTRAST:  150mL ISOVUE-300 IOPAMIDOL (ISOVUE-300) INJECTION 61% COMPARISON:  Radiograph earlier this day. FINDINGS: Cardiovascular: Aortic atherosclerosis without aneurysm. The heart is normal in size. There are coronary artery calcifications. 14 mm soft tissue density anterior to the right ventricle may be loculated pericardial fluid versus adenopathy. Mediastinum/Nodes: Contiguous right hilar soft  tissue density likely combination of central mass and hilar adenopathy is contiguous with anterior paratracheal adenopathy, with soft tissue conglomerate measuring 8.1 x 4.0 x 6.6 cm. This causes mass effect on the right mainstem and right middle lobe bronchus. Additional right inferior hilar adenopathy with node measuring 15 mm in the infrahilar region. 14 mm soft tissue density anterior to the right ventricle may be epicardial adenopathy or loculated pericardial fluid. No left hilar adenopathy. No supraclavicular adenopathy. Thyroid gland is diminutive. The esophagus is nondistended. Lungs/Pleura: Right hilar soft tissue density likely combination of central pulmonary lesion and enlarged lymph nodes. Adjacent micronodularity extends into the right upper and right middle lobes with associated septal thickening. There tree in bud opacities extending into the right upper lobe. 4 mm right apical nodule image 39 series 7. Moderate emphysema and central bronchial thickening. Dependent opacity in the right lower lobe likely atelectasis. No pleural fluid. Upper Abdomen: No adrenal mass. 12 mm  soft tissue nodule in the left upper quadrant adjacent to the splenic flexure of the colon has similar density to the adjacent spleen and is likely a splenule but nonspecific. No focal hepatic mass. Musculoskeletal: No blastic or destructive lytic osseous lesions. Remote left lateral rib fractures. Prominent Schmorl's node in the superior endplate of T7. paucity of subcutaneous fat suggesting cachexia. IMPRESSION: 1. Abnormal right hilar soft tissue density likely combination of central lung mass and adenopathy, appears contiguous with anterior paratracheal adenopathy. This conglomerate soft tissue measures 8.1 x 4.0 x 6.6 cm, unable to differentiate primary malignancy from adenopathy. There is adjacent septal thickening and micronodularity which may be lymphangitic spread of tumor. This causes mass effect on the right mainstem and  right middle lobe bronchi. 2. Soft tissue density anterior to the right ventricle may be loculated pericardial fluid or epicardial adenopathy. 3. Tree in bud opacity in the right upper and to lesser extent right middle lobe may be infectious, inflammatory, postobstructive or malignant. Nonspecific 4 mm right apical pulmonary nodule. 4. Moderate emphysema and central bronchial thickening. Aortic Atherosclerosis (ICD10-I70.0) and Emphysema (ICD10-J43.9). Electronically Signed   By: Keith Rake M.D.   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 However, PET can only be done in opd and there are delays getting it - recommend EBUS under anesthesia - he is on schedule for tuesday9/17/19 at approx 13.30h byt Dr Carney Corners  - who will independently assess patient today  - MEanwile, check ABG, LFT, lactate, trop, mag, phos and INR - continue copd nebs - informed PFT lab that this procedure will likely be outpateint  PCCM will sign off    Disposition / Summary of Today's Plan 04/12/18   Per triad.   BEST PRACTICE  DVT prophylaxis: per triad GI prophylaxis: per triad Diet: per triad Mobility:per triad Code Status: full Family Communication: patient updatd. He lives alone . Rest of famly in [place]. He has transport to come back    SIGNATURE    Dr. Brand Males, M.D., F.C.C.P,  Pulmonary and Critical Care Medicine Staff Physician, [facility] Director - Interstitial Lung Disease  Program  Pulmonary [place] [facility] [place], [place], [address]  Pager: [phone], If no answer or between  15:00h - 7:00h: call [phone] Telephone: [phone]  1:15 PM 04/12/2018

## 2018-04-12 NOTE — Progress Notes (Signed)
   04/12/18 1400  Clinical Encounter Type  Visited With Patient  Visit Type Initial;Psychological support;Spiritual support  Referral From Nurse  Consult/Referral To Chaplain  Spiritual Encounters  Spiritual Needs Brochure;Other (Comment)  Stress Factors  Patient Stress Factors Other (Comment) (Advance Directive )  Advance Directives (For Healthcare)  Does Patient Have a Medical Advance Directive? No  Would patient like information on creating a medical advance directive? Yes (Inpatient - patient requests chaplain consult to create a medical advance directive) (Patient given information )   I visited with the patient per spiritual care consult. I provided the patient with a copy of the Advance Directive paperwork. He was not ready to discuss or complete at this time.  Please, contact Spiritual Care for further assistance.   Chaplain Shanon Ace M.Div., [facility]

## 2018-04-12 NOTE — Progress Notes (Signed)
ED TO INPATIENT HANDOFF REPORT  Name/Age/Gender Warren Blankenship 62 y.o. male  Code Status    Code Status Orders  (From admission, onward)         Start     Ordered   04/12/18 0457  Full code  Continuous     04/12/18 0458        Code Status History    Date Active Date Inactive Code Status Order ID Comments User Context   03/19/2018 2344 03/20/2018 2050 Full Code [id number]  Elwyn Reach, MD Inpatient      Home/SNF/Other Home  Chief Complaint Hypoytension  Level of Care/Admitting Diagnosis ED Disposition    ED Disposition Condition [place]: [facility] [[id number]]  Level of Care: Telemetry [5]  Admit to tele based on following criteria: Complex arrhythmia (Bradycardia/Tachycardia)  Diagnosis: Lung mass [208903]  Admitting Physician: Norval Morton [[id number]]  Attending Physician: Norval Morton [[id number]]  PT Class (Do Not Modify): Observation [104]  PT Acc Code (Do Not Modify): Observation [10022]       Medical History History reviewed. No pertinent past medical history.  Allergies No Known Allergies  IV Location/Drains/Wounds Patient Lines/Drains/Airways Status   Active Line/Drains/Airways    Name:   Placement date:   Placement time:   Site:   Days:   Peripheral IV 04/11/18 Left Antecubital   04/11/18    2232    Antecubital   1          Labs/Imaging Results for orders placed or performed during the hospital encounter of 04/11/18 (from the past 48 hour(s))  Basic metabolic panel     Status: Abnormal   Collection Time: 04/11/18 11:00 PM  Result Value Ref Range   Sodium 133 (L) 135 - 145 mmol/L   Potassium 4.9 3.5 - 5.1 mmol/L    Comment: MODERATE HEMOLYSIS   Chloride 96 (L) 98 - 111 mmol/L   CO2 23 22 - 32 mmol/L   Glucose, Bld 156 (H) 70 - 99 mg/dL   BUN 10 8 - 23 mg/dL   Creatinine, Ser 1.05 0.61 - 1.24 mg/dL   Calcium 8.6 (L) 8.9 - 10.3 mg/dL   GFR calc non Af Amer >60 >60 mL/min   GFR calc Af  Amer >60 >60 mL/min    Comment: (NOTE) The eGFR has been calculated using the CKD EPI equation. This calculation has not been validated in all clinical situations. eGFR's persistently <60 mL/min signify possible Chronic Kidney Disease.    Anion gap 14 5 - 15    Comment: Performed at [facility], [place] [address] [place], [place] [address]  CBC with Differential     Status: Abnormal   Collection Time: 04/11/18 11:00 PM  Result Value Ref Range   WBC 7.1 4.0 - 10.5 K/uL   RBC 3.21 (L) 4.22 - 5.81 MIL/uL   Hemoglobin 8.4 (L) 13.0 - 17.0 g/dL   HCT 27.6 (L) 39.0 - 52.0 %   MCV 86.0 78.0 - 100.0 fL   MCH 26.2 26.0 - 34.0 pg   MCHC 30.4 30.0 - 36.0 g/dL   RDW 17.5 (H) 11.5 - 15.5 %   Platelets 475 (H) 150 - 400 K/uL   Neutrophils Relative % 80 %   Neutro Abs 5.7 1.7 - 7.7 K/uL   Lymphocytes Relative 12 %   Lymphs Abs 0.8 0.7 - 4.0 K/uL   Monocytes Relative 8 %   Monocytes Absolute 0.5 0.1 -  1.0 K/uL   Eosinophils Relative 0 %   Eosinophils Absolute 0.0 0.0 - 0.7 K/uL   Basophils Relative 0 %   Basophils Absolute 0.0 0.0 - 0.1 K/uL    Comment: Performed at [facility], [place] [address], [place], [place] [address]   Dg Chest 2 View  Result Date: 04/11/2018 CLINICAL DATA:  Cough EXAM: CHEST - 2 VIEW COMPARISON:  03/19/2018 FINDINGS: Hyperinflated lungs. No focal airspace disease or effusion. Stable cardiomediastinal silhouette with aortic atherosclerosis. Enlarged right hilar vessels. No pneumothorax. IMPRESSION: Hyperinflation. Asymmetric right hilar fullness could be due to enlarged hilar vessels. Chest CT could obtain to exclude hilar mass. No acute pulmonary infiltrate. Electronically Signed   By: Donavan Foil M.D.   On: 04/11/2018 23:10   Ct Chest W Contrast  Result Date: 04/12/2018 CLINICAL DATA:  Right hilar fullness on x-ray. Cough. Current smoker. EXAM: CT CHEST WITH CONTRAST TECHNIQUE: Multidetector CT imaging of the chest was performed  during intravenous contrast administration. CONTRAST:  186m ISOVUE-300 IOPAMIDOL (ISOVUE-300) INJECTION 61% COMPARISON:  Radiograph earlier this day. FINDINGS: Cardiovascular: Aortic atherosclerosis without aneurysm. The heart is normal in size. There are coronary artery calcifications. 14 mm soft tissue density anterior to the right ventricle may be loculated pericardial fluid versus adenopathy. Mediastinum/Nodes: Contiguous right hilar soft tissue density likely combination of central mass and hilar adenopathy is contiguous with anterior paratracheal adenopathy, with soft tissue conglomerate measuring 8.1 x 4.0 x 6.6 cm. This causes mass effect on the right mainstem and right middle lobe bronchus. Additional right inferior hilar adenopathy with node measuring 15 mm in the infrahilar region. 14 mm soft tissue density anterior to the right ventricle may be epicardial adenopathy or loculated pericardial fluid. No left hilar adenopathy. No supraclavicular adenopathy. Thyroid gland is diminutive. The esophagus is nondistended. Lungs/Pleura: Right hilar soft tissue density likely combination of central pulmonary lesion and enlarged lymph nodes. Adjacent micronodularity extends into the right upper and right middle lobes with associated septal thickening. There tree in bud opacities extending into the right upper lobe. 4 mm right apical nodule image 39 series 7. Moderate emphysema and central bronchial thickening. Dependent opacity in the right lower lobe likely atelectasis. No pleural fluid. Upper Abdomen: No adrenal mass. 12 mm soft tissue nodule in the left upper quadrant adjacent to the splenic flexure of the colon has similar density to the adjacent spleen and is likely a splenule but nonspecific. No focal hepatic mass. Musculoskeletal: No blastic or destructive lytic osseous lesions. Remote left lateral rib fractures. Prominent Schmorl's node in the superior endplate of T7. paucity of subcutaneous fat suggesting  cachexia. IMPRESSION: 1. Abnormal right hilar soft tissue density likely combination of central lung mass and adenopathy, appears contiguous with anterior paratracheal adenopathy. This conglomerate soft tissue measures 8.1 x 4.0 x 6.6 cm, unable to differentiate primary malignancy from adenopathy. There is adjacent septal thickening and micronodularity which may be lymphangitic spread of tumor. This causes mass effect on the right mainstem and right middle lobe bronchi. 2. Soft tissue density anterior to the right ventricle may be loculated pericardial fluid or epicardial adenopathy. 3. Tree in bud opacity in the right upper and to lesser extent right middle lobe may be infectious, inflammatory, postobstructive or malignant. Nonspecific 4 mm right apical pulmonary nodule. 4. Moderate emphysema and central bronchial thickening. Aortic Atherosclerosis (ICD10-I70.0) and Emphysema (ICD10-J43.9). Electronically Signed   By: MKeith RakeM.D.   On: 04/12/2018 01:18    Pending Labs Unresulted Labs (From admission,  onward)    Start     Ordered   04/12/18 0500  CBC  Tomorrow morning,   R     04/12/18 0458   04/12/18 0340  Basic metabolic panel  Tomorrow morning,   R     04/12/18 0458          Vitals/Pain Today's Vitals   04/12/18 0315 04/12/18 0330 04/12/18 0400 04/12/18 0430  BP: 101/69 94/67 100/70 100/64  Pulse: 90 89 93 91  Resp: (!) 31 (!) 27 (!) 24 (!) 23  Temp:      TempSrc:      SpO2: 95% 95% 94% 97%  PainSc:        Isolation Precautions No active isolations  Medications Medications  enoxaparin (LOVENOX) injection 40 mg (has no administration in time range)  sodium chloride flush (NS) 0.9 % injection 3 mL (has no administration in time range)  0.9 %  sodium chloride infusion (has no administration in time range)  acetaminophen (TYLENOL) tablet 650 mg (has no administration in time range)    Or  acetaminophen (TYLENOL) suppository 650 mg (has no administration in time range)   ondansetron (ZOFRAN) tablet 4 mg (has no administration in time range)    Or  ondansetron (ZOFRAN) injection 4 mg (has no administration in time range)  ipratropium-albuterol (DUONEB) 0.5-2.5 (3) MG/3ML nebulizer solution 3 mL (has no administration in time range)  guaiFENesin (MUCINEX) 12 hr tablet 600 mg (has no administration in time range)  sodium chloride 0.9 % bolus 1,000 mL (1,000 mLs Intravenous New Bag/Given 04/11/18 2302)  iopamidol (ISOVUE-300) 61 % injection 100 mL (100 mLs Intravenous Contrast Given 04/12/18 0018)    Mobility walks with device

## 2018-04-12 NOTE — Clinical Social Work Note (Signed)
Clinical Social Work Assessment  Patient Details  Name: Warren Blankenship MRN: [id number] Date of Birth: December 30, 1955  Date of referral:  04/12/18               Reason for consult:  Housing Concerns/Homelessness, Financial Concerns                Permission sought to share information with:  (None at this time.) Permission granted to share information::     Name::        Agency::     Relationship::     Contact Information:     Housing/Transportation Living arrangements for the past 2 months:  Apartment Source of Information:  Patient Patient Interpreter Needed:  None Criminal Activity/Legal Involvement Pertinent to Current Situation/Hospitalization:  No - Comment as needed Significant Relationships:  Friend, Significant Other Lives with:  Self Do you feel safe going back to the place where you live?  Yes Need for family participation in patient care:  No (Coment)  Care giving concerns:  Per consult, "failure to thrive". Patient lives alone and has previous admission. Patient does not have strong support system besides a few friends and a girlfriend. History of ETOH abuse but not current.    Social Worker assessment / plan:  CSW met with patient at bedside to discuss role and potential homeless issues as noted in consult. Patient reports he lives in by himself in apartment. He does not have family nearby or much social support. He does have friends nearby but they do not offer him support or help him much. Patient reports he has a girlfriend who lives nearby and he plans for her to take him home at discharge. Patient has no current housing concerns.  CSW asked patient about history with ETOH abuse. Patient reports he has long history of ETOH abuse but quit drinking cold [place] a few months ago and has been doing well. He reports he had no help but just "knew I had to stop." He did not want any substance abuse resources.  When asked if patient has any concerns after leaving hospital, he  only stated he was concerned about the bill. CSW told patient someone from financial services would speak with him due to his lack of insurance. Patient was already aware and is waiting to speak with them.   No other CSW needs identified. Signing off at this time.   Employment status:  Retired Forensic scientist:  Self Pay (Medicaid Pending) PT Recommendations:  Not assessed at this time Information / Referral to community resources:     Patient/Family's Response to care:  Patient did not want any resources from [place]. Patient reports he is able to care for himself at home, lives in an apartment, and is doing well with his ETOH sobriety. He was thankful for CSW's offers.  Patient/Family's Understanding of and Emotional Response to Diagnosis, Current Treatment, and Prognosis:  Patient understands current admission and discharge plan. He plans to discharge home with his girlfriend picking him up at discharge.   Emotional Assessment Appearance:  Appears stated age Attitude/Demeanor/Rapport:    Affect (typically observed):  Appropriate, Calm, Pleasant Orientation:  Oriented to Self, Oriented to Place, Oriented to  Time, Oriented to Situation Alcohol / Substance use:  Alcohol Use(Long history of ETOH abuse but has not drank for multiple months now.) Psych involvement (Current and /or in the community):  No (Comment)  Discharge Needs  Concerns to be addressed:  No discharge needs identified Readmission within the last  30 days:  Yes Current discharge risk:  Lives alone, Inadequate Financial Supports Barriers to Discharge:  No Barriers Identified, Continued Medical Work up   [employer], New Site 04/12/2018, 11:56 AM

## 2018-04-13 LAB — BASIC METABOLIC PANEL
Anion gap: 7 (ref 5–15)
BUN: <5 mg/dL — ABNORMAL LOW (ref 8–23)
CALCIUM: 7.8 mg/dL — AB (ref 8.9–10.3)
CO2: 27 mmol/L (ref 22–32)
CREATININE: 0.53 mg/dL — AB (ref 0.61–1.24)
Chloride: 96 mmol/L — ABNORMAL LOW (ref 98–111)
GFR calc non Af Amer: >60 mL/min (ref >60)
GLUCOSE: 94 mg/dL (ref 70–99)
Potassium: 3 mmol/L — ABNORMAL LOW (ref 3.5–5.1)
Sodium: 130 mmol/L — ABNORMAL LOW (ref 135–145)

## 2018-04-13 LAB — HEPATIC FUNCTION PANEL
ALK PHOS: 77 U/L (ref 38–126)
ALT: 7 U/L (ref 0–44)
AST: 14 U/L — AB (ref 15–41)
Albumin: 1.7 g/dL — ABNORMAL LOW (ref 3.5–5.0)
BILIRUBIN DIRECT: 0.1 mg/dL (ref 0.0–0.2)
BILIRUBIN TOTAL: 0.5 mg/dL (ref 0.3–1.2)
Indirect Bilirubin: 0.4 mg/dL (ref 0.3–0.9)
Total Protein: 6.1 g/dL — ABNORMAL LOW (ref 6.5–8.1)

## 2018-04-13 LAB — CBC
HCT: 23.9 % — ABNORMAL LOW (ref 39.0–52.0)
Hemoglobin: 7.6 g/dL — ABNORMAL LOW (ref 13.0–17.0)
MCH: 26.3 pg (ref 26.0–34.0)
MCHC: 31.8 g/dL (ref 30.0–36.0)
MCV: 82.7 fL (ref 78.0–100.0)
PLATELETS: 412 10*3/uL — AB (ref 150–400)
RBC: 2.89 MIL/uL — ABNORMAL LOW (ref 4.22–5.81)
RDW: 16.8 % — ABNORMAL HIGH (ref 11.5–15.5)
WBC: 5 10*3/uL (ref 4.0–10.5)

## 2018-04-13 LAB — MAGNESIUM: MAGNESIUM: 1.9 mg/dL (ref 1.7–2.4)

## 2018-04-13 LAB — ABO/RH: ABO/RH(D): B POS

## 2018-04-13 LAB — PREPARE RBC (CROSSMATCH)

## 2018-04-13 MED ORDER — MAGNESIUM SULFATE 2 GM/50ML IV SOLN
2.0000 g | Freq: Once | INTRAVENOUS | Status: AC
  Administered 2018-04-13: 2 g via INTRAVENOUS
  Filled 2018-04-13: qty 50

## 2018-04-13 MED ORDER — POTASSIUM CHLORIDE CRYS ER 20 MEQ PO TBCR
40.0000 meq | EXTENDED_RELEASE_TABLET | ORAL | Status: AC
  Administered 2018-04-13 (×2): 40 meq via ORAL
  Filled 2018-04-13 (×2): qty 2

## 2018-04-13 MED ORDER — POTASSIUM CHLORIDE CRYS ER 20 MEQ PO TBCR: 40.0000 meq | EXTENDED_RELEASE_TABLET | Freq: Once | ORAL | Status: DC

## 2018-04-13 MED ORDER — SODIUM CHLORIDE 0.9% IV SOLUTION
Freq: Once | INTRAVENOUS | Status: AC
  Administered 2018-04-13: 09:00:00 via INTRAVENOUS

## 2018-04-13 MED ORDER — POTASSIUM CHLORIDE 10 MEQ/50ML IV SOLN
10.0000 meq | INTRAVENOUS | Status: AC
  Administered 2018-04-13 (×3): 10 meq via INTRAVENOUS
  Filled 2018-04-13 (×3): qty 50

## 2018-04-13 MED ORDER — NICOTINE 21 MG/24HR TD PT24: 21.0000 mg | MEDICATED_PATCH | Freq: Every day | TRANSDERMAL | 0 refills | Status: DC

## 2018-04-13 MED ORDER — POTASSIUM CHLORIDE CRYS ER 20 MEQ PO TBCR
40.0000 meq | EXTENDED_RELEASE_TABLET | Freq: Once | ORAL | Status: AC
  Administered 2018-04-13: 40 meq via ORAL
  Filled 2018-04-13: qty 2

## 2018-04-13 MED ORDER — ADULT MULTIVITAMIN W/MINERALS CH: 1.0000 | ORAL_TABLET | Freq: Every day | ORAL | Status: AC

## 2018-04-13 NOTE — Progress Notes (Signed)
Contacted [facility] for RW for home. Jonnie Finner RN CCM Case Mgmt phone [phone]

## 2018-04-13 NOTE — Care Management Note (Signed)
Case Management Note  Patient Details  Name: Warren Blankenship MRN: [id number] Date of Birth: 1955/11/30  Subjective/Objective:   Lung mass             Action/Plan: NCM spoke to pt and girlfriend at bedside. Pt states RW needed. [facility] contacted for RW for home. Able to afford meds. Pt will contact [facility] on Monday to arrange appt. Provided pt with [facility] brochure.    Expected Discharge Date:  04/13/18               Expected Discharge Plan:  Home/Self Care  In-House Referral:  Clinical Social Work  Discharge planning Services  CM Consult  Post Acute Care Choice:  NA Choice offered to:  NA  DME Arranged:  [place] rolling DME Agency:  [place]:  NA [employer] Agency:  NA  Status of Service:  Completed, signed off  If discussed at [place] of Stay Meetings, dates discussed:    Additional Comments:  Erenest Rasher, RN 04/13/2018, 4:34 PM

## 2018-04-13 NOTE — Discharge Summary (Addendum)
Physician Discharge Summary  Warren Blankenship ZOX:[id number] DOB: [birth year] DOA: 04/11/2018  PCP: Patient, No Pcp Per  Admit date: 04/11/2018 Discharge date: 04/13/2018  Admitted From: home Disposition home Recommendations for Outpatient Follow-up:  1. Follow up with PCP in 1-2 weeks 2. Please obtain BMP/CBC in one week 3. Follow-up with Dr. Valeta Harms on Tuesday, 04/16/2018  PCCM and follow-up with Dr. Lisbeth Renshaw 04/15/2018 Monday early next week.  Home Health none Equipment/Devices none  Discharge Condition:stable CODE STATUS full Diet recommendation: cardiac  Brief/Interim Summary:62 y.o. male with medical history significant of tobacco and alcohol abuse; who presents with complaints of weakness.  He had just recently been hospitalized from 8/20-8/21 for dehydration with reports of at least a 20 pound weight loss.  During hospitalization patient was found to have acute kidney injury, hyperkalemia, and lactic acidosis which she was given IV fluids with improvement of symptoms.  A CT scan of the chest was considered, but not performed given clear chest x-ray.  Since being home patient reports still having poor appetite because food has a bad taste.  Reports eating very small amount every other day. Associated symptoms include productive cough.  Cough worsens when he lays down.  He reports smoking 1 pack of cigarettes on average per day since about the age of 35.  Denies having any shortness of breath, chest pain, hemoptysis, abdominal pain, nausea, vomiting, diarrhea, blood in stool/urine.  While he was walking to the store tyesterday he lost his balance, but was caught by someone.  Denies hitting his head or loss of consciousness.  He previously reported history of alcohol abuse, but has not drank in several months.  EMS was called by the bystander due to his condition.  ED Course: Upon admission to the emergency department patient was noted to be afebrile, pulse 88-106, respiration 1531, blood pressure  77/56 improving to 112/71 on after 1 L of fluid.  Chest x-ray revealed hyperinflation with asymmetric right hilar fullness.  CT scan of the chest revealed large centralized mass concerning for malignancy.  TRH called to admit.    Discharge Diagnoses:  Principal Problem:   Lung mass Active Problems:   Transient hypotension   Normocytic normochromic anemia   Thrombocytosis (HCC)   Tobacco abuse  1] New Lung mass: CT chest with 8 x 4 x 6.6 cm mass with mass-effect on the right mainstem and right middle lobe bronchi.  Patient is on room air saturating above 95% denies any shortness of breath chest pain facial pain nausea vomiting.  Patient was seen by PCCM and has scheduled an appointment to do a endobronchial ultrasound biopsy on Tuesday, 04/16/2018.  This will be done by Dr. Valeta Harms.  Discussed with him.  Patient given instructions to come to endoscopy unit at [facility] on Tuesday morning at 11:00.  He will also follow-up with radiation oncology Dr. Lisbeth Renshaw on Monday, 04/15/2018.  Patient anxious to go home.  Transient hypotension: Resolved.  Initial blood pressure noted to be as low as 77/56, but improved after 1 L of normal saline IV fluids.    Normocytic normochromic anemia: Hemoglobin 8.4 on admission.  Previously hemoglobin 9 at discharge on 8/20.  Hemoglobin 7.4 today with no evidence of active bleeding some of it is secondary to hemodilution.  He was given 1 unit of blood transfusion.  Thrombocytosis:  Initial platelet count 475.   Tobacco abuse: Patient admits to 50+ smoking pack-year history. - Counseled on need of cessation of tobacco use - Nicotine patch  Hypokalemia  K of 3.0 was repleted with 80 of p.o. potassium and 30 of IV potassium.  Discharge Instructions  Discharge Instructions    Call MD for:  difficulty breathing, headache or visual disturbances   Complete by:  As directed    Call MD for:  persistant dizziness or light-headedness   Complete by:  As directed     Call MD for:  persistant nausea and vomiting   Complete by:  As directed    Call MD for:  severe uncontrolled pain   Complete by:  As directed    Diet - low sodium heart healthy   Complete by:  As directed    Increase activity slowly   Complete by:  As directed      Allergies as of 04/13/2018   No Known Allergies     Medication List    TAKE these medications   multivitamin with minerals Tabs tablet Take 1 tablet by mouth daily. Start taking on:  04/14/2018   nicotine 21 mg/24hr patch Commonly known as:  NICODERM CQ - dosed in mg/24 hours Place 1 patch (21 mg total) onto the skin daily. Start taking on:  04/14/2018      Follow-up Information    Icard, Bradley L, DO Follow up.   Specialty:  Pulmonary Disease Why:  COME TO [facility] TUESDAY AT 11 AM. Contact information: [place] [address] [phone]        Kyung Rudd, MD. Call.   Specialty:  Radiation Oncology Why:  Follow-up with Dr. Lisbeth Renshaw on Monday, 04/15/2018.  Please call before going for the appointment. Contact information: [address] [place] [address] [phone]          No Known Allergies  Consultations: PCCM  Procedures/Studies: Dg Chest 2 View  Result Date: 04/11/2018 CLINICAL DATA:  Cough EXAM: CHEST - 2 VIEW COMPARISON:  03/19/2018 FINDINGS: Hyperinflated lungs. No focal airspace disease or effusion. Stable cardiomediastinal silhouette with aortic atherosclerosis. Enlarged right hilar vessels. No pneumothorax. IMPRESSION: Hyperinflation. Asymmetric right hilar fullness could be due to enlarged hilar vessels. Chest CT could obtain to exclude hilar mass. No acute pulmonary infiltrate. Electronically Signed   By: Donavan Foil M.D.   On: 04/11/2018 23:10   Dg Chest 2 View  Result Date: 03/19/2018 CLINICAL DATA:  Cough EXAM: CHEST - 2 VIEW COMPARISON:  None. FINDINGS: Hyperinflation with emphysematous disease. No focal airspace disease or effusion. Normal  heart size. Aortic atherosclerosis. No pneumothorax IMPRESSION: Hyperinflation with emphysema.  No focal pulmonary infiltrate. Electronically Signed   By: Donavan Foil M.D.   On: 03/19/2018 20:55   Ct Chest W Contrast  Result Date: 04/12/2018 CLINICAL DATA:  Right hilar fullness on x-ray. Cough. Current smoker. EXAM: CT CHEST WITH CONTRAST TECHNIQUE: Multidetector CT imaging of the chest was performed during intravenous contrast administration. CONTRAST:  153mL ISOVUE-300 IOPAMIDOL (ISOVUE-300) INJECTION 61% COMPARISON:  Radiograph earlier this day. FINDINGS: Cardiovascular: Aortic atherosclerosis without aneurysm. The heart is normal in size. There are coronary artery calcifications. 14 mm soft tissue density anterior to the right ventricle may be loculated pericardial fluid versus adenopathy. Mediastinum/Nodes: Contiguous right hilar soft tissue density likely combination of central mass and hilar adenopathy is contiguous with anterior paratracheal adenopathy, with soft tissue conglomerate measuring 8.1 x 4.0 x 6.6 cm. This causes mass effect on the right mainstem and right middle lobe bronchus. Additional right inferior hilar adenopathy with node measuring 15 mm in the infrahilar region. 14 mm soft tissue density anterior  to the right ventricle may be epicardial adenopathy or loculated pericardial fluid. No left hilar adenopathy. No supraclavicular adenopathy. Thyroid gland is diminutive. The esophagus is nondistended. Lungs/Pleura: Right hilar soft tissue density likely combination of central pulmonary lesion and enlarged lymph nodes. Adjacent micronodularity extends into the right upper and right middle lobes with associated septal thickening. There tree in bud opacities extending into the right upper lobe. 4 mm right apical nodule image 39 series 7. Moderate emphysema and central bronchial thickening. Dependent opacity in the right lower lobe likely atelectasis. No pleural fluid. Upper Abdomen: No adrenal  mass. 12 mm soft tissue nodule in the left upper quadrant adjacent to the splenic flexure of the colon has similar density to the adjacent spleen and is likely a splenule but nonspecific. No focal hepatic mass. Musculoskeletal: No blastic or destructive lytic osseous lesions. Remote left lateral rib fractures. Prominent Schmorl's node in the superior endplate of T7. paucity of subcutaneous fat suggesting cachexia. IMPRESSION: 1. Abnormal right hilar soft tissue density likely combination of central lung mass and adenopathy, appears contiguous with anterior paratracheal adenopathy. This conglomerate soft tissue measures 8.1 x 4.0 x 6.6 cm, unable to differentiate primary malignancy from adenopathy. There is adjacent septal thickening and micronodularity which may be lymphangitic spread of tumor. This causes mass effect on the right mainstem and right middle lobe bronchi. 2. Soft tissue density anterior to the right ventricle may be loculated pericardial fluid or epicardial adenopathy. 3. Tree in bud opacity in the right upper and to lesser extent right middle lobe may be infectious, inflammatory, postobstructive or malignant. Nonspecific 4 mm right apical pulmonary nodule. 4. Moderate emphysema and central bronchial thickening. Aortic Atherosclerosis (ICD10-I70.0) and Emphysema (ICD10-J43.9). Electronically Signed   By: Keith Rake M.D.   On: 04/12/2018 01:18   Mr Jeri Cos TM Contrast  Result Date: 04/12/2018 CLINICAL DATA:  Small-cell lung cancer.  Staging. EXAM: MRI HEAD WITHOUT AND WITH CONTRAST TECHNIQUE: Multiplanar, multiecho pulse sequences of the brain and surrounding structures were obtained without and with intravenous contrast. CONTRAST:  4.5 cc Gadavist COMPARISON:  None. FINDINGS: Sequences vary from mild to moderately motion degraded. INTRACRANIAL CONTENTS: No reduced diffusion to suggest acute ischemia or hypercellular tumor. No susceptibility artifact to suggest hemorrhage. Moderate  ventriculomegaly in the base of global parenchymal brain volume loss. Patchy supratentorial white matter FLAIR T2 hyperintensities. No suspicious parenchymal signal, masses, mass effect. No abnormal intraparenchymal or extra-axial enhancement. No abnormal extra-axial fluid collections. No extra-axial masses. VASCULAR: Normal major intracranial vascular flow voids present at skull base. SKULL AND UPPER CERVICAL SPINE: No abnormal sellar expansion. No suspicious calvarial bone marrow signal. Craniocervical junction maintained. SINUSES/ORBITS: RIGHT mastoid effusion. Mild paranasal sinus mucosal thickening without air-fluid levels. Included ocular globes and orbital contents are non-suspicious. OTHER: Patient is edentulous. IMPRESSION: 1. Motion degraded examination.  No intracranial metastasis. 2. Moderate parenchymal brain volume loss, advanced for age. 3. Mild chronic small vessel ischemic changes. Electronically Signed   By: Elon Alas M.D.   On: 04/12/2018 22:06    (Echo, Carotid, EGD, Colonoscopy, ERCP)    Subjective:   Discharge Exam: Vitals:   04/13/18 1130 04/13/18 1404  BP: (!) 88/51 111/66  Pulse: 92 89  Resp: 16 20  Temp: 99.5 F (37.5 C) 99.1 F (37.3 C)  SpO2: 98% 100%   Vitals:   04/13/18 0458 04/13/18 1115 04/13/18 1130 04/13/18 1404  BP: 114/69 96/65 (!) 88/51 111/66  Pulse: 91 90 92 89  Resp: 14 16 16  20  Temp: 99.4 F (37.4 C) 99.5 F (37.5 C) 99.5 F (37.5 C) 99.1 F (37.3 C)  TempSrc: Oral Oral  Oral  SpO2: 95% 100% 98% 100%  Weight:      Height:        General: Pt is alert, awake, not in acute distress Cardiovascular: RRR, S1/S2 +, no rubs, no gallops Respiratory: CTA bilaterally, no wheezing, no rhonchi Abdominal: Soft, NT, ND, bowel sounds + Extremities: no edema, no cyanosis    The results of significant diagnostics from this hospitalization (including imaging, microbiology, ancillary and laboratory) are listed below for reference.      Microbiology: No results found for this or any previous visit (from the past 240 hour(s)).   Labs: BNP (last 3 results) No results for input(s): BNP in the last 8760 hours. Basic Metabolic Panel: Recent Labs  Lab 04/11/18 2300 04/12/18 0636 04/12/18 1345 04/13/18 0501  NA 133* 132*  --  130*  K 4.9 3.8  --  3.0*  CL 96* 96*  --  96*  CO2 23 25  --  27  GLUCOSE 156* 98  --  94  BUN 10 8  --  <5*  CREATININE 1.05 0.74  --  0.53*  CALCIUM 8.6* 8.2*  --  7.8*  MG  --   --  1.6* 1.9  PHOS  --   --  3.0  --    Liver Function Tests: Recent Labs  Lab 04/13/18 0501  AST 14*  ALT 7  ALKPHOS 77  BILITOT 0.5  PROT 6.1*  ALBUMIN 1.7*   No results for input(s): LIPASE, AMYLASE in the last 168 hours. No results for input(s): AMMONIA in the last 168 hours. CBC: Recent Labs  Lab 04/11/18 2300 04/12/18 0636 04/13/18 0501  WBC 7.1 6.0 5.0  NEUTROABS 5.7  --   --   HGB 8.4* 8.3* 7.6*  HCT 27.6* 26.0* 23.9*  MCV 86.0 83.3 82.7  PLT 475* 422* 412*   Cardiac Enzymes: Recent Labs  Lab 04/12/18 1345  TROPONINI <0.03   BNP: Invalid input(s): POCBNP CBG: No results for input(s): GLUCAP in the last 168 hours. D-Dimer No results for input(s): DDIMER in the last 72 hours. Hgb A1c No results for input(s): HGBA1C in the last 72 hours. Lipid Profile No results for input(s): CHOL, HDL, LDLCALC, TRIG, CHOLHDL, LDLDIRECT in the last 72 hours. Thyroid function studies No results for input(s): TSH, T4TOTAL, T3FREE, THYROIDAB in the last 72 hours.  Invalid input(s): FREET3 Anemia work up No results for input(s): VITAMINB12, FOLATE, FERRITIN, TIBC, IRON, RETICCTPCT in the last 72 hours. Urinalysis No results found for: COLORURINE, APPEARANCEUR, LABSPEC, [place], GLUCOSEU, HGBUR, BILIRUBINUR, KETONESUR, PROTEINUR, UROBILINOGEN, NITRITE, LEUKOCYTESUR Sepsis Labs Invalid input(s): PROCALCITONIN,  WBC,  LACTICIDVEN Microbiology No results found for this or any previous visit  (from the past 240 hour(s)).   Time coordinating discharge: 35 minutes  SIGNED:   Georgette Shell, MD  [facility] 04/13/2018, 3:11 PM Pager   If 7PM-7AM, please contact night-coverage [email or web address] Password [email or web address]

## 2018-04-13 NOTE — Progress Notes (Signed)
Patient ambulated 120 ft in hallway with front wheel walker. Also ambulated in room to bathroom. Tolerated well, denies dizziness or feeling light headed. Steady gate. No SOB, BP after walking 112/67.  Margurite Auerbach, RN

## 2018-04-14 LAB — TYPE AND SCREEN
ABO/RH(D): B POS
Antibody Screen: NEGATIVE
UNIT DIVISION: 0

## 2018-04-14 LAB — BPAM RBC
Blood Product Expiration Date: 201909282359
ISSUE DATE / TIME: 201909141123
UNIT TYPE AND RH: 7300

## 2018-04-15 ENCOUNTER — Telehealth: Payer: Self-pay | Admitting: *Deleted

## 2018-04-15 ENCOUNTER — Ambulatory Visit
Admission: RE | Admit: 2018-04-15 | Discharge: 2018-04-15 | Disposition: A | Discharge status: Home / Self Care | Payer: MEDICAID | Source: Ambulatory Visit | Attending: Radiation Oncology | Admitting: Radiation Oncology

## 2018-04-15 ENCOUNTER — Encounter: Payer: Self-pay | Admitting: *Deleted

## 2018-04-15 ENCOUNTER — Other Ambulatory Visit: Payer: Self-pay | Admitting: Radiation Oncology

## 2018-04-15 DIAGNOSIS — C3490 Malignant neoplasm of unspecified part of unspecified bronchus or lung: Secondary | ICD-10-CM

## 2018-04-15 NOTE — Telephone Encounter (Signed)
Oncology Nurse Navigator Documentation  Oncology Nurse Navigator Flowsheets 04/15/2018  Navigator Location [facility]  Referral date to RadOnc/MedOnc 04/15/2018  Navigator Encounter Type Telephone/I updated Dr. Julien Nordmann on referral.  He can see him after his biopsy at thoracic clinic. I called to schedule him but was unable to reach.  I did leave a vm message for him to call me with my name and phone number.   Telephone Outgoing Call  Treatment Phase Abnormal Scans  Barriers/Navigation Needs Coordination of Care  Interventions Coordination of Care  Coordination of Care Other  Acuity Level 2  Time Spent with Patient 30

## 2018-04-15 NOTE — Progress Notes (Signed)
Oncology Nurse Navigator Documentation  Oncology Nurse Navigator Flowsheets 04/15/2018  Navigator Location [facility]  Navigator Encounter Type Other/rad on updated me that phone number in EMR is not correct.  I was given another number and tried to call to schedule patient.  I was unable to reach or leave a vm message.  I did place the new phone number in the appt tab to call for appts.   Treatment Phase Abnormal Scans  Barriers/Navigation Needs Coordination of Care  Interventions Coordination of Care  Coordination of Care Other  Acuity Level 1  Time Spent with Patient 15

## 2018-04-15 NOTE — Telephone Encounter (Signed)
Oncology Nurse Navigator Documentation  Oncology Nurse Navigator Flowsheets 04/15/2018  Navigator Location [facility]  Navigator Encounter Type Telephone/I called Warren Blankenship to schedule him for clinic. I was unable to reach him or leave a vm message.    Telephone Outgoing Call  Treatment Phase Abnormal Scans  Barriers/Navigation Needs Coordination of Care  Interventions Coordination of Care  Coordination of Care Other  Acuity Level 1  Time Spent with Patient 15

## 2018-04-16 ENCOUNTER — Telehealth: Payer: Self-pay | Admitting: Pulmonary Disease

## 2018-04-16 ENCOUNTER — Ambulatory Visit (HOSPITAL_COMMUNITY): Admit: 2018-04-16 | Payer: MEDICAID | Admitting: Pulmonary Disease

## 2018-04-16 ENCOUNTER — Encounter (HOSPITAL_COMMUNITY): Payer: Self-pay

## 2018-04-16 ENCOUNTER — Ambulatory Visit (HOSPITAL_COMMUNITY): Payer: MEDICAID

## 2018-04-16 ENCOUNTER — Encounter (HOSPITAL_COMMUNITY): Payer: Self-pay | Admitting: Anesthesiology

## 2018-04-16 SURGERY — ENDOBRONCHIAL ULTRASOUND (EBUS)
Anesthesia: General | Laterality: Bilateral

## 2018-04-16 NOTE — Telephone Encounter (Signed)
Thanks so much for your efforts.

## 2018-04-16 NOTE — Telephone Encounter (Signed)
PCCM:   Patient did not show for his outpatient schedule bronchoscopy.  He was discharged from the hospital on Saturday.  Per the discharging physician patient was given time, date, location and recommendations for follow-up.  Of note he did not follow-up with his oncology appointment that was scheduled yesterday.  I attempted to call the patient at the telephone numbers available within the chart.  The number was listed for his daughter Janett Billow.  The number rang for a few times and there was no available voicemail and stated that the number could not be completed as dialed.  After multiple attempts I also called the listed "office number" in the chart.  This was for a company called [employer].  I was transferred to the human resources department where I was unable to get a hold of anyone as I attempted to find out if he still worked there or if they would be willing to share additional contact information.  Decision was made due to the patient's known medical condition and potential severity of illness I placed a phone call to [place] nonemergency line for [employer].  The patient's address within the medical record was also deemed not a verified address for [facility] through the emergency services department.  I left my cell phone number as well as contact information with the Public Safety dispatcher.  She is going to work with the police department to attempt a well check for the patient.  CC: Venida Jarvis, DO [facility] 04/16/2018 2:14 PM  Personal pager: [phone] If unanswered, please page CCM On-call: [phone]

## 2018-04-16 NOTE — Telephone Encounter (Signed)
Forwarding to [place] -per [employer]

## 2018-04-17 NOTE — Telephone Encounter (Signed)
Scheduled ebus 03/24/18@10am @[facility]  pt's friend can not do this she will be out of town will speak to dr icard to see what else we can do Warren Blankenship

## 2018-04-18 ENCOUNTER — Inpatient Hospital Stay: Payer: MEDICAID | Attending: Internal Medicine | Admitting: Internal Medicine

## 2018-04-18 ENCOUNTER — Other Ambulatory Visit: Payer: Self-pay | Admitting: *Deleted

## 2018-04-19 ENCOUNTER — Telehealth: Payer: Self-pay | Admitting: *Deleted

## 2018-04-19 NOTE — Telephone Encounter (Signed)
Patient missed first Bronchoscopy appointment due to transportation. We have tried to be flexible with scheduling as much as possible. The bronch is being pushed out further than we would like due to transportation.

## 2018-04-19 NOTE — Telephone Encounter (Signed)
Oncology Nurse Navigator Documentation  Oncology Nurse Navigator Flowsheets 04/19/2018  Navigator Location [facility]  Navigator Encounter Type Telephone/I called patient's daughter to follow up on him.  He was to be seen by medical oncology yesterday but was a no show.  I was unable to reach her or leave a vm message.  I will update referring doctor.   Telephone Outgoing Call  Abnormal Finding Date 04/11/2018  Treatment Phase Abnormal Scans  Barriers/Navigation Needs Coordination of Care  Interventions Coordination of Care  Coordination of Care Other  Acuity Level 2  Time Spent with Patient 30

## 2018-04-19 NOTE — Telephone Encounter (Signed)
Any suggestions on the rescheduling of this [place]

## 2018-04-22 NOTE — Telephone Encounter (Signed)
I spoke with [facility] this morning. We will try to setup for Thursday early morning or mid afternoon, 04/25/2018 or Monday 04/29/2018 at [facility].  Thanks,  Garner Nash, DO [facility] 04/22/2018 9:07 AM

## 2018-04-24 ENCOUNTER — Ambulatory Visit: Admit: 2018-04-24 | Payer: MEDICAID | Admitting: Pulmonary Disease

## 2018-04-24 ENCOUNTER — Ambulatory Visit (HOSPITAL_COMMUNITY): Payer: Self-pay

## 2018-04-24 SURGERY — VIDEO BRONCHOSCOPY WITHOUT FLUORO
Anesthesia: General | Laterality: Right

## 2018-04-28 NOTE — Anesthesia Preprocedure Evaluation (Addendum)
Anesthesia Evaluation  Patient identified by MRN, date of birth, ID band Patient awake    Reviewed: Allergy & Precautions, NPO status , Patient's Chart, lab work & pertinent test results  Airway Mallampati: II  TM Distance: >3 FB Neck ROM: Full    Dental  (+) Lower Dentures, Upper Dentures   Pulmonary Current Smoker,    Pulmonary exam normal breath sounds clear to auscultation       Cardiovascular negative cardio ROS Normal cardiovascular exam Rhythm:Regular Rate:Normal     Neuro/Psych negative neurological ROS  negative psych ROS   GI/Hepatic negative GI ROS, Neg liver ROS,   Endo/Other  negative endocrine ROS  Renal/GU negative Renal ROS     Musculoskeletal negative musculoskeletal ROS (+)   Abdominal   Peds  Hematology negative hematology ROS (+)   Anesthesia Other Findings lung mass  Reproductive/Obstetrics                            Anesthesia Physical Anesthesia Plan  ASA: III  Anesthesia Plan: General   Post-op Pain Management:    Induction: Intravenous  PONV Risk Score and Plan: 1 and Ondansetron, Dexamethasone and Treatment may vary due to age or medical condition  Airway Management Planned: Oral ETT  Additional Equipment:   Intra-op Plan:   Post-operative Plan: Extubation in OR  Informed Consent: I have reviewed the patients History and Physical, chart, labs and discussed the procedure including the risks, benefits and alternatives for the proposed anesthesia with the patient or authorized representative who has indicated his/her understanding and acceptance.   Dental advisory given  Plan Discussed with: CRNA  Anesthesia Plan Comments:         Anesthesia Quick Evaluation

## 2018-04-29 ENCOUNTER — Ambulatory Visit (HOSPITAL_COMMUNITY): Payer: Self-pay | Admitting: Anesthesiology

## 2018-04-29 ENCOUNTER — Inpatient Hospital Stay (HOSPITAL_COMMUNITY)
Admission: RE | Admit: 2018-04-29 | Discharge: 2018-05-03 | DRG: 180 | Disposition: A | Discharge status: Home Health | Payer: Self-pay | Source: Ambulatory Visit | Attending: Internal Medicine | Admitting: Internal Medicine

## 2018-04-29 ENCOUNTER — Encounter (HOSPITAL_COMMUNITY): Payer: Self-pay | Admitting: *Deleted

## 2018-04-29 ENCOUNTER — Ambulatory Visit
Admission: RE | Admit: 2018-04-29 | Discharge: 2018-04-29 | Disposition: A | Discharge status: Home / Self Care | Payer: Self-pay | Source: Ambulatory Visit | Attending: Radiation Oncology | Admitting: Radiation Oncology

## 2018-04-29 ENCOUNTER — Encounter (HOSPITAL_COMMUNITY)
Admission: RE | Disposition: A | Discharge status: Home Health | Payer: Self-pay | Source: Ambulatory Visit | Attending: Internal Medicine

## 2018-04-29 ENCOUNTER — Encounter: Payer: Self-pay | Admitting: *Deleted

## 2018-04-29 ENCOUNTER — Other Ambulatory Visit: Payer: Self-pay

## 2018-04-29 ENCOUNTER — Ambulatory Visit (HOSPITAL_COMMUNITY)
Admission: RE | Admit: 2018-04-29 | Discharge: 2018-04-29 | Disposition: A | Discharge status: Home / Self Care | Payer: Self-pay | Source: Ambulatory Visit | Attending: Pulmonary Disease | Admitting: Pulmonary Disease

## 2018-04-29 DIAGNOSIS — C342 Malignant neoplasm of middle lobe, bronchus or lung: Secondary | ICD-10-CM | POA: Diagnosis present

## 2018-04-29 DIAGNOSIS — R590 Localized enlarged lymph nodes: Secondary | ICD-10-CM | POA: Diagnosis present

## 2018-04-29 DIAGNOSIS — R64 Cachexia: Secondary | ICD-10-CM | POA: Diagnosis present

## 2018-04-29 DIAGNOSIS — D638 Anemia in other chronic diseases classified elsewhere: Secondary | ICD-10-CM

## 2018-04-29 DIAGNOSIS — E876 Hypokalemia: Secondary | ICD-10-CM | POA: Diagnosis present

## 2018-04-29 DIAGNOSIS — D649 Anemia, unspecified: Secondary | ICD-10-CM | POA: Diagnosis present

## 2018-04-29 DIAGNOSIS — Z659 Problem related to unspecified psychosocial circumstances: Secondary | ICD-10-CM

## 2018-04-29 DIAGNOSIS — E43 Unspecified severe protein-calorie malnutrition: Secondary | ICD-10-CM | POA: Diagnosis present

## 2018-04-29 DIAGNOSIS — J9601 Acute respiratory failure with hypoxia: Secondary | ICD-10-CM | POA: Diagnosis present

## 2018-04-29 DIAGNOSIS — F1010 Alcohol abuse, uncomplicated: Secondary | ICD-10-CM | POA: Diagnosis present

## 2018-04-29 DIAGNOSIS — R0902 Hypoxemia: Secondary | ICD-10-CM | POA: Diagnosis not present

## 2018-04-29 DIAGNOSIS — R627 Adult failure to thrive: Secondary | ICD-10-CM | POA: Diagnosis present

## 2018-04-29 DIAGNOSIS — F17210 Nicotine dependence, cigarettes, uncomplicated: Secondary | ICD-10-CM | POA: Diagnosis present

## 2018-04-29 DIAGNOSIS — C3401 Malignant neoplasm of right main bronchus: Secondary | ICD-10-CM

## 2018-04-29 DIAGNOSIS — R918 Other nonspecific abnormal finding of lung field: Secondary | ICD-10-CM

## 2018-04-29 DIAGNOSIS — F17200 Nicotine dependence, unspecified, uncomplicated: Secondary | ICD-10-CM | POA: Diagnosis present

## 2018-04-29 DIAGNOSIS — I871 Compression of vein: Secondary | ICD-10-CM | POA: Diagnosis present

## 2018-04-29 DIAGNOSIS — J9621 Acute and chronic respiratory failure with hypoxia: Secondary | ICD-10-CM

## 2018-04-29 DIAGNOSIS — J449 Chronic obstructive pulmonary disease, unspecified: Secondary | ICD-10-CM | POA: Diagnosis present

## 2018-04-29 DIAGNOSIS — E871 Hypo-osmolality and hyponatremia: Secondary | ICD-10-CM | POA: Diagnosis present

## 2018-04-29 DIAGNOSIS — Z609 Problem related to social environment, unspecified: Secondary | ICD-10-CM

## 2018-04-29 DIAGNOSIS — R54 Age-related physical debility: Secondary | ICD-10-CM | POA: Diagnosis present

## 2018-04-29 DIAGNOSIS — Z681 Body mass index (BMI) 19 or less, adult: Secondary | ICD-10-CM

## 2018-04-29 DIAGNOSIS — I959 Hypotension, unspecified: Secondary | ICD-10-CM | POA: Diagnosis present

## 2018-04-29 DIAGNOSIS — C3490 Malignant neoplasm of unspecified part of unspecified bronchus or lung: Principal | ICD-10-CM | POA: Diagnosis present

## 2018-04-29 HISTORY — PX: BIOPSY: SHX5522

## 2018-04-29 HISTORY — DX: Other nonspecific abnormal finding of lung field: R91.8

## 2018-04-29 HISTORY — PX: BRONCHIAL WASHINGS: SHX5105

## 2018-04-29 HISTORY — PX: OTHER SURGICAL HISTORY: SHX169

## 2018-04-29 HISTORY — PX: FLEXIBLE BRONCHOSCOPY: SHX5094

## 2018-04-29 HISTORY — PX: ENDOBRONCHIAL ULTRASOUND: SHX5096

## 2018-04-29 HISTORY — PX: FINE NEEDLE ASPIRATION BIOPSY: CATH118315

## 2018-04-29 LAB — IRON AND TIBC
IRON: 23 ug/dL — AB (ref 45–182)
Saturation Ratios: 18 % (ref 17.9–39.5)
TIBC: 128 ug/dL — AB (ref 250–450)
UIBC: 105 ug/dL

## 2018-04-29 LAB — CBC WITH DIFFERENTIAL/PLATELET
Basophils Absolute: 0 10*3/uL (ref 0.0–0.1)
Basophils Relative: 0 %
EOS PCT: 0 %
Eosinophils Absolute: 0 10*3/uL (ref 0.0–0.7)
HEMATOCRIT: 28.8 % — AB (ref 39.0–52.0)
Hemoglobin: 8.9 g/dL — ABNORMAL LOW (ref 13.0–17.0)
LYMPHS PCT: 9 %
Lymphs Abs: 0.5 10*3/uL — ABNORMAL LOW (ref 0.7–4.0)
MCH: 27 pg (ref 26.0–34.0)
MCHC: 30.9 g/dL (ref 30.0–36.0)
MCV: 87.3 fL (ref 78.0–100.0)
Monocytes Absolute: 0.2 10*3/uL (ref 0.1–1.0)
Monocytes Relative: 3 %
NEUTROS ABS: 4.8 10*3/uL (ref 1.7–7.7)
Neutrophils Relative %: 88 %
Platelets: 380 10*3/uL (ref 150–400)
RBC: 3.3 MIL/uL — AB (ref 4.22–5.81)
RDW: 16.6 % — ABNORMAL HIGH (ref 11.5–15.5)
WBC: 5.5 10*3/uL (ref 4.0–10.5)

## 2018-04-29 LAB — COMPREHENSIVE METABOLIC PANEL
ALK PHOS: 81 U/L (ref 38–126)
ALT: 15 U/L (ref 0–44)
AST: 38 U/L (ref 15–41)
Albumin: 2 g/dL — ABNORMAL LOW (ref 3.5–5.0)
Anion gap: 10 (ref 5–15)
BUN: 13 mg/dL (ref 8–23)
CHLORIDE: 95 mmol/L — AB (ref 98–111)
CO2: 28 mmol/L (ref 22–32)
CREATININE: 0.58 mg/dL — AB (ref 0.61–1.24)
Calcium: 8.5 mg/dL — ABNORMAL LOW (ref 8.9–10.3)
GFR calc Af Amer: >60 mL/min (ref >60)
Glucose, Bld: 173 mg/dL — ABNORMAL HIGH (ref 70–99)
Potassium: 3.9 mmol/L (ref 3.5–5.1)
Sodium: 133 mmol/L — ABNORMAL LOW (ref 135–145)
Total Bilirubin: 0.6 mg/dL (ref 0.3–1.2)
Total Protein: 6.9 g/dL (ref 6.5–8.1)

## 2018-04-29 LAB — MAGNESIUM
MAGNESIUM: 1.5 mg/dL — AB (ref 1.7–2.4)
Magnesium: 1.4 mg/dL — ABNORMAL LOW (ref 1.7–2.4)

## 2018-04-29 LAB — PHOSPHORUS: Phosphorus: 2.9 mg/dL (ref 2.5–4.6)

## 2018-04-29 LAB — RETICULOCYTES
RBC.: 3.3 MIL/uL — ABNORMAL LOW (ref 4.22–5.81)
Retic Count, Absolute: 26.4 10*3/uL (ref 19.0–186.0)
Retic Ct Pct: 0.8 % (ref 0.4–3.1)

## 2018-04-29 LAB — MRSA PCR SCREENING: MRSA BY PCR: NEGATIVE

## 2018-04-29 LAB — FOLATE: FOLATE: 8.2 ng/mL (ref >5.9)

## 2018-04-29 LAB — PROTIME-INR
INR: 1.15
Prothrombin Time: 14.6 seconds (ref 11.4–15.2)

## 2018-04-29 LAB — VITAMIN B12: VITAMIN B 12: 748 pg/mL (ref 180–914)

## 2018-04-29 LAB — FERRITIN: Ferritin: 356 ng/mL — ABNORMAL HIGH (ref 24–336)

## 2018-04-29 SURGERY — ENDOBRONCHIAL ULTRASOUND (EBUS)
Anesthesia: General | Laterality: Bilateral

## 2018-04-29 MED ORDER — ONDANSETRON HCL 4 MG/2ML IJ SOLN
INTRAMUSCULAR | Status: DC | PRN
  Administered 2018-04-29: 4 mg via INTRAVENOUS

## 2018-04-29 MED ORDER — TRAZODONE HCL 50 MG PO TABS: 25.0000 mg | ORAL_TABLET | Freq: Every evening | ORAL | Status: DC | PRN

## 2018-04-29 MED ORDER — POTASSIUM CHLORIDE CRYS ER 20 MEQ PO TBCR: 40.0000 meq | EXTENDED_RELEASE_TABLET | ORAL | Status: DC

## 2018-04-29 MED ORDER — PROPOFOL 10 MG/ML IV BOLUS
INTRAVENOUS | Status: DC | PRN
  Administered 2018-04-29: 30 mg via INTRAVENOUS
  Administered 2018-04-29: 70 mg via INTRAVENOUS

## 2018-04-29 MED ORDER — ONDANSETRON HCL 4 MG PO TABS: 4.0000 mg | ORAL_TABLET | Freq: Four times a day (QID) | ORAL | Status: DC | PRN

## 2018-04-29 MED ORDER — PROPOFOL 10 MG/ML IV BOLUS
INTRAVENOUS | Status: AC
  Filled 2018-04-29: qty 20

## 2018-04-29 MED ORDER — ONDANSETRON HCL 4 MG/2ML IJ SOLN: 4.0000 mg | Freq: Four times a day (QID) | INTRAMUSCULAR | Status: DC | PRN

## 2018-04-29 MED ORDER — QUETIAPINE FUMARATE 25 MG PO TABS
25.0000 mg | ORAL_TABLET | Freq: Every day | ORAL | Status: DC
  Administered 2018-04-29 – 2018-05-02 (×4): 25 mg via ORAL
  Filled 2018-04-29 (×4): qty 1

## 2018-04-29 MED ORDER — SODIUM CHLORIDE 0.9 % IV SOLN
INTRAVENOUS | Status: DC
  Administered 2018-04-29 – 2018-04-30 (×3): via INTRAVENOUS

## 2018-04-29 MED ORDER — NICOTINE 21 MG/24HR TD PT24: 21.0000 mg | MEDICATED_PATCH | Freq: Every day | TRANSDERMAL | Status: DC

## 2018-04-29 MED ORDER — ROCURONIUM BROMIDE 10 MG/ML (PF) SYRINGE
PREFILLED_SYRINGE | INTRAVENOUS | Status: DC | PRN
  Administered 2018-04-29: 40 mg via INTRAVENOUS

## 2018-04-29 MED ORDER — MIDAZOLAM HCL 2 MG/2ML IJ SOLN
INTRAMUSCULAR | Status: AC
  Filled 2018-04-29: qty 2

## 2018-04-29 MED ORDER — ACETAMINOPHEN 650 MG RE SUPP: 650.0000 mg | Freq: Four times a day (QID) | RECTAL | Status: DC | PRN

## 2018-04-29 MED ORDER — LACTATED RINGERS IV SOLN
INTRAVENOUS | Status: DC
  Administered 2018-04-29 – 2018-04-30 (×3): via INTRAVENOUS

## 2018-04-29 MED ORDER — PHENYLEPHRINE 40 MCG/ML (10ML) SYRINGE FOR IV PUSH (FOR BLOOD PRESSURE SUPPORT)
PREFILLED_SYRINGE | INTRAVENOUS | Status: DC | PRN
  Administered 2018-04-29 (×6): 80 ug via INTRAVENOUS
  Administered 2018-04-29: 160 ug via INTRAVENOUS

## 2018-04-29 MED ORDER — FENTANYL CITRATE (PF) 250 MCG/5ML IJ SOLN
INTRAMUSCULAR | Status: DC | PRN
  Administered 2018-04-29: 100 ug via INTRAVENOUS

## 2018-04-29 MED ORDER — EPHEDRINE SULFATE-NACL 50-0.9 MG/10ML-% IV SOSY
PREFILLED_SYRINGE | INTRAVENOUS | Status: DC | PRN
  Administered 2018-04-29: 10 mg via INTRAVENOUS

## 2018-04-29 MED ORDER — IPRATROPIUM-ALBUTEROL 0.5-2.5 (3) MG/3ML IN SOLN: 3.0000 mL | RESPIRATORY_TRACT | Status: DC | PRN

## 2018-04-29 MED ORDER — LIDOCAINE 2% (20 MG/ML) 5 ML SYRINGE
INTRAMUSCULAR | Status: DC | PRN
  Administered 2018-04-29: 60 mg via INTRAVENOUS

## 2018-04-29 MED ORDER — ACETAMINOPHEN 325 MG PO TABS: 650.0000 mg | ORAL_TABLET | Freq: Four times a day (QID) | ORAL | Status: DC | PRN

## 2018-04-29 MED ORDER — FENTANYL CITRATE (PF) 100 MCG/2ML IJ SOLN
INTRAMUSCULAR | Status: AC
  Filled 2018-04-29: qty 2

## 2018-04-29 MED ORDER — DEXAMETHASONE SODIUM PHOSPHATE 10 MG/ML IJ SOLN
INTRAMUSCULAR | Status: DC | PRN
  Administered 2018-04-29: 5 mg via INTRAVENOUS

## 2018-04-29 MED ORDER — MIDAZOLAM HCL 5 MG/5ML IJ SOLN
INTRAMUSCULAR | Status: DC | PRN
  Administered 2018-04-29 (×2): 1 mg via INTRAVENOUS

## 2018-04-29 MED ORDER — OXYCODONE HCL 5 MG PO TABS: 5.0000 mg | ORAL_TABLET | ORAL | Status: DC | PRN

## 2018-04-29 MED ORDER — MAGNESIUM SULFATE 2 GM/50ML IV SOLN
2.0000 g | Freq: Once | INTRAVENOUS | Status: AC
  Administered 2018-04-29: 2 g via INTRAVENOUS
  Filled 2018-04-29: qty 50

## 2018-04-29 MED ORDER — NICOTINE 21 MG/24HR TD PT24
21.0000 mg | MEDICATED_PATCH | Freq: Every day | TRANSDERMAL | Status: DC
  Administered 2018-04-29 – 2018-05-01 (×3): 21 mg via TRANSDERMAL
  Filled 2018-04-29 (×3): qty 1

## 2018-04-29 MED ORDER — POLYETHYLENE GLYCOL 3350 17 G PO PACK: 17.0000 g | PACK | Freq: Every day | ORAL | Status: DC | PRN

## 2018-04-29 MED ORDER — SUGAMMADEX SODIUM 200 MG/2ML IV SOLN
INTRAVENOUS | Status: DC | PRN
  Administered 2018-04-29: 100 mg via INTRAVENOUS

## 2018-04-29 NOTE — Transfer of Care (Signed)
Immediate Anesthesia Transfer of Care Note  Patient: Warren Blankenship  Procedure(s) Performed: ENDOBRONCHIAL ULTRASOUND (Bilateral )  Patient Location: PACU  Anesthesia Type:General  Level of Consciousness: sedated  Airway & Oxygen Therapy: Patient Spontanous Breathing and Patient connected to face mask oxygen  Post-op Assessment: Report given to RN and Post -op Vital signs reviewed and stable  Post vital signs: Reviewed and stable  Last Vitals:  Vitals Value Taken Time  BP 90/45 04/29/2018  9:45 AM  Temp    Pulse 86 04/29/2018  9:46 AM  Resp 28 04/29/2018  9:46 AM  SpO2 100 % 04/29/2018  9:46 AM  Vitals shown include unvalidated device data.  Last Pain:  Vitals:   04/29/18 0640  TempSrc: Oral  PainSc: 0-No pain         Complications: No apparent anesthesia complications

## 2018-04-29 NOTE — Progress Notes (Signed)
Oncology Nurse Navigator Documentation  Oncology Nurse Navigator Flowsheets 04/29/2018  Navigator Location [facility]  Navigator Encounter Type Other/I received a call from Dr. Stark Jock regarding Warren Blankenship.  Patient had biopsy today and needed assistance coordinating care with Rad and Med onc.  I updated Rad Onc and Med Onc.   Confirmed Diagnosis Date 04/29/2018  Treatment Phase Abnormal Scans  Barriers/Navigation Needs Coordination of Care  Interventions Coordination of Care  Coordination of Care Other  Acuity Level 2  Time Spent with Patient 30

## 2018-04-29 NOTE — H&P (Addendum)
History and Physical    CULVER FEIGHNER HCW:[id number] DOB: 1956-05-09 DOA: 04/29/2018  PCP: Patient, No Pcp Per Patient coming from: home  Chief Complaint: Lung mass  HPI: Warren Blankenship is a 62 y.o. male with medical history significant for lung mass with hilar and mediastinal adenopathy, severe PCM, hyponatremia, 52-pack-year smoking history and poor social condition who was brought for endobronchial ultrasound and biopsies of the lung mass.  Patient was recently hospitalized from 9/12 to 9/14 for weakness, AKI, hyperkalemia and lactic acidosis.  At that time he had a CT chest that showed large centralized mass with mass-effect on the right mainstem and right middle lobe bronchi.  He was discharged with a plan to have endobronchial ultrasound biopsy outpatient.  However, patient was lost to follow-up until today when he was brought to the hospital by police for endoscopy. MRI brain on 04/12/2018 without brain mets.  Patient lives alone.  Per patient's friend at bedside, patient not able to care for himself and lives in a very poor condition.  Patient had EBUS done by Dr. Valeta Harms this morning who called me to admit patient out of concern for safe disposition.  Per Dr. Valeta Harms, oncology team aware and will start treatment (irradiation) while patient is here.  On admission, patient hypotensive to 82/49.  Satting lower 90s on 5 L by nasal cannula but does not seem to be in acute distress. He was typed and crossed.  One  unit of blood on hold.   ROS Review of Systems  Constitutional: Positive for malaise/fatigue and weight loss. Negative for fever.  HENT: Negative for congestion and sore throat.   Eyes: Negative for blurred vision, photophobia and pain.  Respiratory: Positive for cough. Negative for hemoptysis, shortness of breath and wheezing.   Cardiovascular: Negative for chest pain, palpitations, orthopnea and leg swelling.  Gastrointestinal: Negative for abdominal pain, blood in stool,  diarrhea, melena and vomiting.  Genitourinary: Negative for dysuria, frequency and hematuria.  Musculoskeletal: Negative for myalgias.  Skin: Negative for rash.  Neurological: Negative for speech change, focal weakness, weakness and headaches.  Endo/Heme/Allergies: Does not bruise/bleed easily.  Psychiatric/Behavioral: Negative for depression and substance abuse. The patient is not nervous/anxious.    PMH Past Medical History:  Diagnosis Date  . Mass of right lung   . Tobacco abuse    PSH Past Surgical History:  Procedure Laterality Date  . Endobronchial Ultrasound (EBUS), Mediastinum  Bilateral 04/29/2018   Dr. Alvy Beal Family History  Problem Relation Age of Onset  . Lung disease Father    Social Hx  reports that he has been smoking. He has been smoking about 1.00 pack per day. He has never used smokeless tobacco. He reports that he drinks alcohol. He reports that he has current or past drug history. 52-pack-year history.  Still smokes.  Occasional marijuana.  Denies other recreational drugs or alcohol. Allergy No Known Allergies Home Meds Prior to Admission medications   Medication Sig Start Date End Date Taking? Authorizing Provider  Multiple Vitamin (MULTIVITAMIN WITH MINERALS) TABS tablet Take 1 tablet by mouth daily. 04/14/18  Yes Georgette Shell, MD  nicotine (NICODERM CQ - DOSED IN MG/24 HOURS) 21 mg/24hr patch Place 1 patch (21 mg total) onto the skin daily. 04/14/18   Georgette Shell, MD    Physical Exam: Vitals:   04/29/18 1010 04/29/18 1021 04/29/18 1030 04/29/18 1100  BP: (!) 85/48 (!) 81/54 (!) 82/49 (!) 80/51  Pulse: 86  86  86  Resp: 20 13  18   Temp:      TempSrc:      SpO2: 91% 95% 92% 95%  Weight:      Height:        Constitutional: Frail looking.  No apparent distress. Eyes: PERRLA.  Conjunctival pallor HENMT: atraumatic. Normocephalic. Hearing grossly intact. No rhinorrhea. MMM.  Neck: normal. Supple. No mass.  Resp: normal  effort.  Poor aeration bilaterally.  No wheeze or crackles. CVS: RRR. S1 & S2 heard. No murmurs. No edema. GI: normal bowel sound. No tenderness. No distention. MSK: Significant muscle wasting Skin: no apparent lesion. Normal warmth.  Neuro: Awake. Alert. Oriented appropriately. CN 2-12 grossly intact. Light sensation intact. Normal strength. Psych: Calm. Normal judgment and insight.  Labs on Admission: I have personally reviewed following labs and imaging studies  CBC: No results for input(s): WBC, NEUTROABS, HGB, HCT, MCV, PLT in the last 168 hours. Basic Metabolic Panel: No results for input(s): NA, K, CL, CO2, GLUCOSE, BUN, CREATININE, CALCIUM, MG, PHOS in the last 168 hours. GFR: Estimated Creatinine Clearance: 58.8 mL/min (A) (by C-G formula based on SCr of 0.53 mg/dL (L)). Liver Function Tests: No results for input(s): AST, ALT, ALKPHOS, BILITOT, PROT, ALBUMIN in the last 168 hours. No results for input(s): LIPASE, AMYLASE in the last 168 hours. No results for input(s): AMMONIA in the last 168 hours. Coagulation Profile: No results for input(s): INR, PROTIME in the last 168 hours. Cardiac Enzymes: No results for input(s): CKTOTAL, CKMB, CKMBINDEX, TROPONINI in the last 168 hours. BNP (last 3 results) No results for input(s): PROBNP in the last 8760 hours. HbA1C: No results for input(s): HGBA1C in the last 72 hours. CBG: No results for input(s): GLUCAP in the last 168 hours. Lipid Profile: No results for input(s): CHOL, HDL, LDLCALC, TRIG, CHOLHDL, LDLDIRECT in the last 72 hours. Thyroid Function Tests: No results for input(s): TSH, T4TOTAL, FREET4, T3FREE, THYROIDAB in the last 72 hours. Anemia Panel: No results for input(s): VITAMINB12, FOLATE, FERRITIN, TIBC, IRON, RETICCTPCT in the last 72 hours. Urine analysis: No results found for: COLORURINE, APPEARANCEUR, LABSPEC, PHURINE, GLUCOSEU, HGBUR, BILIRUBINUR, KETONESUR, PROTEINUR, UROBILINOGEN, NITRITE,  LEUKOCYTESUR  Sepsis Labs: none )No results found for this or any previous visit (from the past 240 hour(s)).   Radiological Exams on Admission:  All images have been reviewed by me personally.  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 Has no formal diagnosis of COPD but 52-pack-year smoking history. Also with extrinsic compression of the right main stem and distal RLL bronchus with splaying of the right main carina on EBUS.   Poor aeration bilaterally but no increased work of breathing or wheeze. -DuoNeb -Incentive spirometry -Flutter valve  Lung mass with healer and mediastinal adenopathy: seen on CT chest on 04/12/2018.  Status post EBUS 04/29/18 by Dr. Valeta Harms.  MRI brain 04/12/2018 without brain mets. -Follow-up oncology recommendation. -May start treatment here per Dr. Valeta Harms  Hypotension: concern for low preload in the setting of lung mass/risk for superior vena cava syndrome -IV NS @50  cc/hr with  caution  Severe protein calorie malnutrition: BMI 15 -Nutrition consulted -PT/OT consulted  Hyponatremia: Appears chronic.  Possible SIADH in the setting of malignancy -CMP -Fluid as above  Hypokalemia:  -Check CMP and phosphorus -Replete as needed  Normocytic anemia: last Hgb 7.6 -CBC -Anemia panel -Typed and crossed. -One unit on hold if needed -Daily CBC  Poor social situation: Lives alone.  No family members.  Per friend at bedside, lives in an unclean environment, trash, dark, bugs, writes my CDC. -Social work consult  Tobacco use disorder: 52-pack-year smoking  history -Nicotine patch  DVT prophylaxis: SCD for now Code Status: Full code.  Patient stated that his friend, Donny Pique [phone]) at bedside can make medical decision if he is not able. Family Communication: Friend at bedside Disposition Plan: Admit to stepdown Consults called: Oncology Admission status: Inpatient   Mercy Riding MD [facility] Pager [phone]  If 7PM-7AM, please contact night-coverage [email or web address] Password [email or web address]  04/29/2018, 11:44 AM

## 2018-04-29 NOTE — Anesthesia Postprocedure Evaluation (Signed)
Anesthesia Post Note  Patient: BRAIAN TIJERINA  Procedure(s) Performed: ENDOBRONCHIAL ULTRASOUND (Bilateral ) FLEXIBLE BRONCHOSCOPY BRONCHIAL WASHINGS BIOPSY FINE NEEDLE ASPIRATION BIOPSY     Patient location during evaluation: PACU Anesthesia Type: General Level of consciousness: awake Pain management: pain level controlled Vital Signs Assessment: post-procedure vital signs reviewed and stable Respiratory status: spontaneous breathing, nonlabored ventilation, respiratory function stable and patient connected to nasal cannula oxygen Cardiovascular status: blood pressure returned to baseline and stable Postop Assessment: no apparent nausea or vomiting Anesthetic complications: no    Last Vitals:  Vitals:   04/29/18 1400 04/29/18 1437  BP: (!) 70/43   Pulse: 84 86  Resp: (!) 24 18  Temp:    SpO2: 100% 96%    Last Pain:  Vitals:   04/29/18 1200  TempSrc:   PainSc: 0-No pain                 Ryan P Ellender

## 2018-04-29 NOTE — Anesthesia Procedure Notes (Signed)
Procedure Name: Intubation Date/Time: 04/29/2018 8:08 AM Performed by: Talbot Grumbling, CRNA Pre-anesthesia Checklist: Patient identified, Emergency Drugs available, Suction available and Patient being monitored Patient Re-evaluated:Patient Re-evaluated prior to induction Oxygen Delivery Method: Circle system utilized Preoxygenation: Pre-oxygenation with 100% oxygen Induction Type: IV induction Ventilation: Mask ventilation without difficulty Laryngoscope Size: 2 and Miller Grade View: Grade I Tube type: Oral Tube size: 9.0 mm Number of attempts: 1 Airway Equipment and Method: Stylet Placement Confirmation: ETT inserted through vocal cords under direct vision,  positive ETCO2 and breath sounds checked- equal and bilateral Secured at: 22 cm Tube secured with: Tape Dental Injury: Teeth and Oropharynx as per pre-operative assessment

## 2018-04-29 NOTE — Interval H&P Note (Signed)
History and Physical Interval Note:  04/29/2018 7:33 AM  Warren Blankenship  has presented today for surgery, with the diagnosis of lung mass  The various methods of treatment have been discussed with the patient and family. After consideration of risks, benefits and other options for treatment, the patient has consented to  Procedure(s): ENDOBRONCHIAL ULTRASOUND (Bilateral) as a surgical intervention .  The patient's history has been reviewed, patient examined, no change in status, stable for surgery.  I have reviewed the patient's chart and labs.  Questions were answered to the patient's satisfaction.    This is a 62 yo gentleman, recent imaging diagnosis of a large mediastinal, paratracheal and right hilar mass with areas of necrosis seen on CT. MRI brain was negative for metastasis. The patient currently lives alone and his friend who brings him food does not believe he is able to care for himself. She states he lives in an unclean environment, trash, dirt, bugs, rats/mice... Etc.   The patient was originally scheduled a several days ago for this procedure but failed to show up for the case. He states this was because he forgot and didn't have a ride. He also failed to show to his radiation oncology appointment.   After discussion with the patient as well as his friend, [name], he understands the risks, benefits and alternatives for having the case. We will proceed with EBUS bronchoscopy. No barriers to proceed.    Garner Nash, DO [facility] 04/29/2018 7:42 AM  Personal pager: [phone] If unanswered, please page CCM On-call: [phone]

## 2018-04-29 NOTE — Progress Notes (Signed)
NAME:  Warren Blankenship, MRN:  [id number], DOB:  [birth year], LOS: 0 ADMISSION DATE:  04/29/2018, CONSULTATION DATE:  04/29/2018  REFERRING MD:  Dr. Cyndia Skeeters, CHIEF COMPLAINT:  Lung mass, failure to thrive   Brief History   This is a 62 year old gentleman recently diagnosed via CT imaging with a large mediastinal paratracheal mass with associated hilar and mediastinal adenopathy.  The patient was originally seen in the hospital here at [facility] approximately 2 weeks ago and discharged over the weekend with planned outpatient follow-up.  Patient follow-up bronchoscopy did not show.  We reached out to every number that was available in the chart and eventually able to track down a friend that was willing to go check on him.  We even had [employer] his home to find him.  Ultimately the patient did show for his bronchoscopy today.  He is joined by his friend daily.  After speaking with [name] she states that the patient was so disheveled that she did not feel safe to return home.  She stated that she had to leave the door open because there was bugs and cockroaches and animals inside the home.  [name] said that she removed 8 of decaying trash from patient's bedroom.  After discussion with the patient in endoscopy and the fact that he is requiring 4 L of oxygen and moderate low blood pressures.  The decision was made to contact hospitalist for admission.  I have updated Norton Blizzard our thoracic oncology coordinator.   Past Medical History  Tobacco abuse, non-smoker, significant unintentional weight loss  Significant Hospital Events   04/29/2018: Endobronchial ultrasound-guided transbronchial needle aspiration  Consults: date of consult/date signed off & final recs:  04/29/2018: Pulmonary  Procedures (surgical and bedside):  04/29/2018: Endobronchial ultrasound-guided transbronchial needle aspiration  Significant Diagnostic Tests:  04/29/2018: Endobronchial ultrasound-guided  transbronchial needle aspiration, preliminary pathology read consistent with non-small cell lung cancer  Micro Data:  None  Antimicrobials:  None  Subjective:  Post procedure patient states that he is feeling okay.  He denies chest pain, shortness of breath, nausea vomiting.  Objective   Blood pressure (!) 80/51, pulse 86, temperature 97.6 F (36.4 C), temperature source Oral, resp. rate 18, height 5\' 7"  (1.702 m), weight 44.6 kg, SpO2 95 %.        Intake/Output Summary (Last 24 hours) at 04/29/2018 1134 Last data filed at 04/29/2018 0947 Gross per 24 hour  Intake 1410 ml  Output 5 ml  Net 1405 ml   Filed Weights   04/29/18 0640  Weight: 44.6 kg    Examination: General appearance: 62 y.o., male, NAD, chronically ill-appearing, cachectic Eyes: anicteric sclerae, moist conjunctivae; no lid-lag HENT: NCAT; oropharynx, mucous membranes dry Neck: Trachea midline; visible jugular venous pulsation to the right lower anterior neck Lungs: Diminished right-sided breath sounds, anterior rhonchi bilaterally, no wheeze CV: RRR, S1, S2, no MRGs  Abdomen: Soft, non-tender; non-distended, BS present  Extremities: No peripheral edema or radial and DP pulses present bilaterally, and, significant bilateral muscle wasting Skin: Normal temperature, turgor and texture; no rash, scattered bruising Psych: Flat affect,  Neuro: Alert and oriented to person and place, no focal deficit    Resolved Hospital Problem list   N/a  Assessment & Plan:   Large mediastinal mass encompassing the right hilum, right and left anterior paratracheal, subcarinal locations with associated hilar and mediastinal adenopathy.  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  The patient does not have any arm swelling, chest pain dilatation or facial swelling. - I have spoke with our oncology coordinator who will reach out to  medical oncology as well as radiation oncology. - Radiation oncology may make some decisions for radiation therapy/planning while he is inpatient to help facilitate his care  Failure to thrive, unable to care for himself at home Unsafe living environment - Patient would benefit from case management and social work.  He is likely from placement in a facility as he is unable to care for himself - likely nutritional deficiencies   Severe protein caloric malnutrition - likely has nutritional deficiencies  Hypotension, this is likely postprocedural - We need to be careful to not aggressively fluid resuscitate due to the compression of his SVC.  Aggressive fluid resuscitation could lead to facial and arm swelling.  Acute hypoxemic respiratory failure requiring nasal cannula O2 supplementation Probable COPD - Patient's O2 requirements are likely related to his bronchoscopy.  Not to mention the irritation of the airway and creation of bronchospasm patient with underlying probable COPD. - He may benefit from a short course of steroids as well as scheduled bronchodilators - likely need walked for O2 needs prior to discharge planning   Disposition / Summary of Today's Plan 04/29/18   Admission to the hospital under the hospitalist service  I have spoke with Dr. Cyndia Skeeters who has agreed to admit the patient for further evaluation until we are able to obtain clear and safe discharge planning    Diet: Advance as tolerated Pain/Anxiety/Delirium protocol (if indicated): Not applicable VAP protocol (if indicated): Applicable DVT prophylaxis: Okay for chemical GI prophylaxis: Not applicable Hyperglycemia protocol: Not applicable Mobility: advance as tolerated  Code Status: FULL  Family Communication: Percell Boston, up dated   Labs   CBC: No results for input(s): WBC, NEUTROABS, HGB, HCT, MCV, PLT in the last 168 hours.  Basic Metabolic Panel: No results for input(s): NA, K, CL, CO2, GLUCOSE, BUN,  CREATININE, CALCIUM, MG, PHOS in the last 168 hours. GFR: Estimated Creatinine Clearance: 58.8 mL/min (A) (by C-G formula based on SCr of 0.53 mg/dL (L)). No results for input(s): PROCALCITON, WBC, LATICACIDVEN in the last 168 hours.  Liver Function Tests: No results for input(s): AST, ALT, ALKPHOS, BILITOT, PROT, ALBUMIN in the last 168 hours. No results for input(s): LIPASE, AMYLASE in the last 168 hours. No results for input(s): AMMONIA in the last 168 hours.  ABG    Component Value Date/Time   PHART 7.478 (H) 04/12/2018 1402   PCO2ART 36.0 04/12/2018 1402   PO2ART 76.8 (L) 04/12/2018 1402   HCO3 26.3 04/12/2018 1402   O2SAT 95.1 04/12/2018 1402     Coagulation Profile: No results for input(s): INR, PROTIME in the last 168 hours.  Cardiac Enzymes: No results for input(s): CKTOTAL, CKMB, CKMBINDEX, TROPONINI in the last 168 hours.  HbA1C: No results found for: HGBA1C  CBG: No results for input(s): GLUCAP in the last 168 hours.  Admitting History of Present Illness.   Please see above   Review of Systems:   Review of Systems  Constitutional: Positive for chills, malaise/fatigue and weight loss. Negative for fever.  HENT: Negative for hearing loss, sore throat and tinnitus.   Eyes: Negative for blurred vision and double vision.  Respiratory: Positive for cough, sputum production, shortness of breath and wheezing. Negative for hemoptysis and stridor.   Cardiovascular: Negative for chest pain, palpitations, orthopnea, leg swelling and PND.  Gastrointestinal:  Negative for abdominal pain, constipation, diarrhea, heartburn, nausea and vomiting.  Genitourinary: Negative for dysuria, hematuria and urgency.  Musculoskeletal: Negative for joint pain and myalgias.  Skin: Negative for itching and rash.  Neurological: Positive for weakness. Negative for dizziness, tingling and headaches.  Endo/Heme/Allergies: Negative for environmental allergies. Does not bruise/bleed easily.    Psychiatric/Behavioral: Positive for depression. The patient is not nervous/anxious and does not have insomnia.   All other systems reviewed and are negative.    Past Medical History  He,  has a past medical history of Mass of right lung and Tobacco abuse.   Surgical History    Past Surgical History:  Procedure Laterality Date  . Endobronchial Ultrasound (EBUS), Mediastinum  Bilateral 04/29/2018   Dr. Valeta Harms      Social History   Social History   Socioeconomic History  . Marital status: Divorced    Spouse name: Not on file  . Number of children: Not on file  . Years of education: Not on file  . Highest education level: Not on file  Occupational History  . Not on file  Social Needs  . Financial resource strain: Very hard  . Food insecurity:    Worry: Often true    Inability: Often true  . Transportation needs:    Medical: Yes    Non-medical: Yes  Tobacco Use  . Smoking status: Current Every Day Smoker    Packs/day: 1.00  . Smokeless tobacco: Never Used  Substance and Sexual Activity  . Alcohol use: Yes    Comment: Former   . Drug use: Not Currently  . Sexual activity: Not Currently  Lifestyle  . Physical activity:    Days per week: Not on file    Minutes per session: Not on file  . Stress: Not on file  Relationships  . Social connections:    Talks on phone: Not on file    Gets together: Not on file    Attends religious service: Not on file    Active member of club or organization: Not on file    Attends meetings of clubs or organizations: Not on file    Relationship status: Not on file  . Intimate partner violence:    Fear of current or ex partner: Not on file    Emotionally abused: Not on file    Physically abused: Not on file    Forced sexual activity: Not on file  Other Topics Concern  . Not on file  Social History Narrative   Currently resides in an apartment alone.    Friend states that he has not bathed since last hospitalization.    Friend  states that trash, decaying food, animals, bugs, roaches are all living within the home.      ,  reports that he has been smoking. He has been smoking about 1.00 pack per day. He has never used smokeless tobacco. He reports that he drinks alcohol. He reports that he has current or past drug history.   Family History   His family history includes Lung disease in his father.   Allergies No Known Allergies   Home Medications  Prior to Admission medications   Medication Sig Start Date End Date Taking? Authorizing Provider  Multiple Vitamin (MULTIVITAMIN WITH MINERALS) TABS tablet Take 1 tablet by mouth daily. 04/14/18  Yes Georgette Shell, MD  nicotine (NICODERM CQ - DOSED IN MG/24 HOURS) 21 mg/24hr patch Place 1 patch (21 mg total) onto the skin daily. 04/14/18   Rodena Piety,  Noland Fordyce, MD     Garner Nash, DO [facility] 04/29/2018 11:37 AM  Personal pager: [phone] If unanswered, please page CCM On-call: [phone]

## 2018-04-29 NOTE — Op Note (Signed)
Video Bronchoscopy with Endobronchial Ultrasound Procedure Note  Date of Operation: 04/29/2018  Pre-op Diagnosis: Mediastinal mass, mediastinal and hilar adenopathy   Post-op Diagnosis:  Mediastinal mass, mediastinal and hilar adenopathy   Surgeon: Garner Nash, DO   Assistants: None   Anesthesia: General endotracheal anesthesia  Operation: Flexible video fiberoptic bronchoscopy with endobronchial ultrasound and biopsies.  Estimated Blood Loss: Minimal, <6VH   Complications: None   Indications and History: Warren Blankenship is a 62 y.o. male with large right sided mediastinal, paratracheal mass, with associated mediastinal and hilar adenopathy.  The risks, benefits, complications, treatment options and expected outcomes were discussed with the patient.  The possibilities of pneumothorax, pneumonia, reaction to medication, pulmonary aspiration, perforation of a viscus, bleeding, failure to diagnose a condition and creating a complication requiring transfusion or operation were discussed with the patient who freely signed the consent.    Description of Procedure: The patient was examined in the preoperative area and history and data from the preprocedure consultation were reviewed. It was deemed appropriate to proceed.  The patient was taken to [facility] Endo Room 1, identified as Warren Blankenship and the procedure verified as Flexible Video Fiberoptic Bronchoscopy.  A Time Out was held and the above information confirmed. After being taken to the operating room general anesthesia was initiated and the patient  was orally intubated. The video fiberoptic bronchoscope was introduced via the endotracheal tube and a general inspection was performed which showed endobronchial tumor involvement of the anterior wall of the bronchus intermedius, approximately 1.5cm from the main carina, extrinsic compression of the right main stem and distal RLL bronchus with splaying of the right main carina. The left lung  anatomy appeared normal except for bronchiectatic openings and scattered pitting.The standard scope was then withdrawn and the endobronchial ultrasound was used to identify and characterize the peritracheal, hilar and bronchial lymph nodes. Inspection showed a large right sided paratracheal mass and right hilar mass, along with an enlarge station 4R, 7, 4L, a <1cm 10L and ~2cm 11L node. Using real-time ultrasound guidance Wang needle biopsies were take from Station 4R/Mediastinal mass/node, followed by 11L and 4L and were sent for cytology. Intermittently  The patient tolerated the procedure well without apparent complications. There was no significant blood loss. The bronchoscope was withdrawn. Anesthesia was reversed and the patient was taken to the PACU for recovery.   Samples: 1. TBNA Station 4R/Mediastinal mass, X6, 3 slides, 3 cell block  2. TBNA Station 11L, X 6, 3 slides, 3 cell block 3. TBNA Station 4L X 4, cell block  4. Bronchial washing of the right main stem, cytology   Plans:  We will review the cytology, pathology and microbiology results with the patient when they become available. Outpatient followup will be with Leory Plowman L Emmagene Ortner, DO in 3-4 weeks.  He will need to be seen by oncology as well as radiation oncology.    Garner Nash, DO [facility] 04/29/2018 9:43 AM  Personal pager: [phone] If unanswered, please page CCM On-call: [phone]

## 2018-04-30 ENCOUNTER — Ambulatory Visit
Admit: 2018-04-30 | Discharge: 2018-04-30 | Disposition: A | Discharge status: Home / Self Care | Payer: Self-pay | Source: Ambulatory Visit | Attending: Radiation Oncology | Admitting: Radiation Oncology

## 2018-04-30 DIAGNOSIS — Z510 Encounter for antineoplastic radiation therapy: Secondary | ICD-10-CM | POA: Insufficient documentation

## 2018-04-30 DIAGNOSIS — Z720 Tobacco use: Secondary | ICD-10-CM | POA: Insufficient documentation

## 2018-04-30 DIAGNOSIS — C342 Malignant neoplasm of middle lobe, bronchus or lung: Secondary | ICD-10-CM

## 2018-04-30 DIAGNOSIS — C3401 Malignant neoplasm of right main bronchus: Secondary | ICD-10-CM | POA: Insufficient documentation

## 2018-04-30 LAB — BASIC METABOLIC PANEL
ANION GAP: 8 (ref 5–15)
BUN: 11 mg/dL (ref 8–23)
CHLORIDE: 100 mmol/L (ref 98–111)
CO2: 29 mmol/L (ref 22–32)
Calcium: 8.3 mg/dL — ABNORMAL LOW (ref 8.9–10.3)
Creatinine, Ser: 0.59 mg/dL — ABNORMAL LOW (ref 0.61–1.24)
GFR calc non Af Amer: >60 mL/min (ref >60)
Glucose, Bld: 128 mg/dL — ABNORMAL HIGH (ref 70–99)
POTASSIUM: 4.2 mmol/L (ref 3.5–5.1)
Sodium: 137 mmol/L (ref 135–145)

## 2018-04-30 LAB — CBC
HCT: 25.7 % — ABNORMAL LOW (ref 39.0–52.0)
HEMOGLOBIN: 7.9 g/dL — AB (ref 13.0–17.0)
MCH: 26.5 pg (ref 26.0–34.0)
MCHC: 30.7 g/dL (ref 30.0–36.0)
MCV: 86.2 fL (ref 78.0–100.0)
Platelets: 394 10*3/uL (ref 150–400)
RBC: 2.98 MIL/uL — ABNORMAL LOW (ref 4.22–5.81)
RDW: 16.6 % — ABNORMAL HIGH (ref 11.5–15.5)
WBC: 7.6 10*3/uL (ref 4.0–10.5)

## 2018-04-30 MED ORDER — BOOST PLUS PO LIQD
237.0000 mL | Freq: Three times a day (TID) | ORAL | Status: DC
  Administered 2018-04-30 – 2018-05-02 (×6): 237 mL via ORAL
  Filled 2018-04-30 (×10): qty 237

## 2018-04-30 NOTE — Evaluation (Signed)
Occupational Therapy Evaluation Patient Details Name: Warren Blankenship MRN: [id number] DOB: 23-Feb-1956 Today's Date: 04/30/2018    History of Present Illness Pt was admitted for biopsy of lung mass; has hilar and mediastinal adenopathy. Recently admitted with weakness   Clinical Impression   This 62 year old man was admitted for the above.  At baseline, he is independent with adls and lives alone. He reports that he recently bought a RW but hasn't used it yet.  Pt with decreased support and per chart, living conditions are not good. Will follow in acute setting with supervision to mod I level goal. Pt currently needs min A.     Follow Up Recommendations  SNF    Equipment Recommendations  (to be further assessed)    Recommendations for Other Services       Precautions / Restrictions Precautions Precautions: Fall Restrictions Weight Bearing Restrictions: No      Mobility Bed Mobility Overal bed mobility: Needs Assistance             General bed mobility comments: min guard for safety  Transfers Overall transfer level: Needs assistance Equipment used: Rolling walker (2 wheeled) Transfers: Sit to/from [facility] Sit to Stand: Min assist Stand pivot transfers: Min guard       General transfer comment: Steadying assistance to stand.  Min guard for safety with SPT    Balance                                           ADL either performed or assessed with clinical judgement   ADL Overall ADL's : Needs assistance/impaired     Grooming: Set up   Upper Body Bathing: Set up   Lower Body Bathing: Minimal assistance;Sit to/from stand   Upper Body Dressing : Set up   Lower Body Dressing: Minimal assistance;Sit to/from stand   Toilet Transfer: Minimal assistance;Stand-pivot;RW;BSC(chair)   Toileting- Water quality scientist and Hygiene: Minimal assistance               Vision         Perception     Praxis       Pertinent Vitals/Pain Pain Assessment: 0-10 Pain Score: 1  Pain Location: arms from IV  Pain Descriptors / Indicators: Sore Pain Intervention(s): Limited activity within patient's tolerance;Monitored during session     Hand Dominance     Extremity/Trunk Assessment Upper Extremity Assessment Upper Extremity Assessment: Generalized weakness(c/o shoulder pain when lifting above 90)           Communication Communication Communication: No difficulties   Cognition Arousal/Alertness: Awake/alert Behavior During Therapy: WFL for tasks assessed/performed Overall Cognitive Status: Within Functional Limits for tasks assessed                                     General Comments  sitting BP 88/52; standing 81/48. Pt asymptomatic    Exercises     Shoulder Instructions      Home Living Family/patient expects to be discharged to:: Private residence Living Arrangements: Alone   Type of Home: House Home Access: Stairs to enter [employer] of Steps: 1+1 Entrance Stairs-Rails: None Home Layout: One level     Bathroom Shower/Tub: Teacher, early years/pre: Standard     Home Equipment: Environmental consultant - 2 wheels  Additional Comments: poor living conditions per notes      Prior Functioning/Environment Level of Independence: Independent        Comments: bought walker recently but hasn't been using it        OT Problem List: Decreased strength;Decreased activity tolerance;Impaired balance (sitting and/or standing);Decreased knowledge of use of DME or AE;Cardiopulmonary status limiting activity      OT Treatment/Interventions: Self-care/ADL training;DME and/or AE instruction;Energy conservation;Patient/family education;Balance training    OT Goals(Current goals can be found in the care plan section) Acute Rehab OT Goals Patient Stated Goal: none stated; agreeable to OOB OT Goal Formulation: With patient Time For Goal Achievement:  05/14/18 Potential to Achieve Goals: Good ADL Goals Pt Will Transfer to Toilet: with modified independence;ambulating;regular height toilet;bedside commode Additional ADL Goal #1: pt will complete adl at supervision level to retrieve clothing only and initiate at least one rest break for energy conservation  OT Frequency: Min 2X/week   Barriers to D/C:            Co-evaluation PT/OT/SLP Co-Evaluation/Treatment: Yes Reason for Co-Treatment: For patient/therapist safety PT goals addressed during session: Mobility/safety with mobility OT goals addressed during session: ADL's and self-care      AM-PAC PT "6 Clicks" Daily Activity     Outcome Measure Help from another person eating meals?: None Help from another person taking care of personal grooming?: A Little Help from another person toileting, which includes using toliet, bedpan, or urinal?: A Little Help from another person bathing (including washing, rinsing, drying)?: A Little Help from another person to put on and taking off regular upper body clothing?: A Little Help from another person to put on and taking off regular lower body clothing?: A Little 6 Click Score: 19   End of Session Nurse Communication: (on 3:1 commode with call bell)  Activity Tolerance: Patient tolerated treatment well Patient left: in chair;with call bell/phone within reach  OT Visit Diagnosis: Unsteadiness on feet (R26.81);Muscle weakness (generalized) (M62.81)                Time: 5631-4970 OT Time Calculation (min): 24 min Charges:  OT General Charges $OT Visit: 1 Visit OT Evaluation $OT Eval Low Complexity: [place], OTR/L Acute Rehabilitation Services [phone] [facility] pager [phone] office 04/30/2018  [place] 04/30/2018, 2:48 PM

## 2018-04-30 NOTE — Progress Notes (Signed)
PROGRESS NOTE    Warren Blankenship  ONG:[id number] DOB: 1956-07-01 DOA: 04/29/2018 PCP: Patient, No Pcp Per    Brief Narrative:  62 y.o. male with medical history significant for lung mass with hilar and mediastinal adenopathy, severe PCM, hyponatremia, 52-pack-year smoking history and poor social condition who was brought for endobronchial ultrasound and biopsies of the lung mass.  Patient was recently hospitalized from 9/12 to 9/14 for weakness, AKI, hyperkalemia and lactic acidosis.  At that time he had a CT chest that showed large centralized mass with mass-effect on the right mainstem and right middle lobe bronchi.  He was discharged with a plan to have endobronchial ultrasound biopsy outpatient.  However, patient was lost to follow-up until today when he was brought to the hospital by police for endoscopy. MRI brain on 04/12/2018 without brain mets.  Patient lives alone.  Per patient's friend at bedside, patient not able to care for himself and lives in a very poor condition.  Patient had EBUS done by Dr. Valeta Harms this morning who called me to admit patient out of concern for safe disposition.  Per Dr. Valeta Harms, oncology team aware and will start treatment (irradiation) while patient is here.  On admission, patient hypotensive to 82/49.  Satting lower 90s on 5 L by nasal cannula but does not seem to be in acute distress. He was typed and crossed.  One  unit of blood on hold.  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 -On imaging, patient noted to have  extrinsic compression of the right main stem and distal RLL bronchus with splaying of the right main carina on EBUS.    -Concerns for poor aeration bilaterally  -Continue on Duonebs as tolerated  Lung mass with healer and mediastinal adenopathy:  -Noted on CT chest on 04/12/2018.   -Patient is now status post EBUS 04/29/18 by Dr. Valeta Harms.   -MRI brain 04/12/2018 without brain mets. -biopsy results are pending. Would formally consult Oncology when biopsy results return -Have consulted Rad-Onc  Hypotension:  -concern for low preload in the setting of lung mass/risk for superior vena cava syndrome -currently continued on IV NS @50  cc/hr with caution -asymptomatic  Severe protein calorie malnutrition: BMI 15 -Nutrition consulted -PT/OT consulted -stable at present  Hyponatremia: Appears chronic.  Possible SIADH in the setting of malignancy -CMP -Continue fluid as above  Hypokalemia:  -Replete as needed -Stable at present -Repeat bmet in AM  Normocytic anemia: last Hgb 7.6 -Anemia panel -Typed and crossed. -Repeat cbc in AM  Poor social situation: Lives alone.  No family members.  Per friend at bedside, lives in an unclean environment, trash, dark, bugs, writes my CDC. -SW consultedd  Tobacco use disorder: 52-pack-year smoking history -Nicotine patch was ordered  DVT prophylaxis: SCD's Code Status: Full Family Communication: Pt in room, family not at bedside Disposition Plan: Uncertain at this time  Consultants:   PCCM  Radiation Oncology  Procedures:     Antimicrobials: Anti-infectives (From admission, onward)   None       Subjective: Without  complaints this AM  Objective: Vitals:   04/30/18 0800 04/30/18 0808 04/30/18 0900 04/30/18 1257  BP: (!) 79/42  (!) 85/47   Pulse: 86  81   Resp: 19  (!) 21   Temp:  (!) 97.4 F (36.3 C)  98.2 F (36.8 C)  TempSrc:  Oral  Oral  SpO2: 97%  100%   Weight:      Height:        Intake/Output Summary  (Last 24 hours) at 04/30/2018 1358 Last data filed at 04/30/2018 0800 Gross per 24 hour  Intake 581.89 ml  Output 1900 ml  Net -1318.11 ml   Filed Weights   04/29/18 0640 04/29/18 1200  Weight: 44.6 kg 45 kg    Examination:  General exam: Appears calm and comfortable  Respiratory system: Clear to auscultation. Respiratory effort normal. Cardiovascular system: S1 & S2 heard, RRR Gastrointestinal system: Abdomen is nondistended, soft and nontender. No organomegaly or masses felt. Normal bowel sounds heard. Central nervous system: Alert and oriented. No focal neurological deficits. Extremities: Symmetric 5 x 5 power. Skin: No rashes, lesions Psychiatry: Judgement and insight appear normal. Mood & affect appropriate.   Data Reviewed: I have personally reviewed following labs and imaging studies  CBC: Recent Labs  Lab 04/29/18 1219 04/30/18 0322  WBC 5.5 7.6  NEUTROABS 4.8  --   HGB 8.9* 7.9*  HCT 28.8* 25.7*  MCV 87.3 86.2  PLT 380 124   Basic Metabolic Panel: Recent Labs  Lab 04/29/18 1219 04/29/18 1427 04/30/18 0322  NA 133*  --  137  K 3.9  --  4.2  CL 95*  --  100  CO2 28  --  29  GLUCOSE 173*  --  128*  BUN 13  --  11  CREATININE 0.58*  --  0.59*  CALCIUM 8.5*  --  8.3*  MG 1.4* 1.5*  --   PHOS 2.9  --   --    GFR: Estimated Creatinine Clearance: 59.4 mL/min (A) (by C-G formula based on SCr of 0.59 mg/dL (L)). Liver Function Tests: Recent Labs  Lab 04/29/18 1219  AST 38  ALT 15  ALKPHOS 81  BILITOT 0.6  PROT 6.9  ALBUMIN 2.0*   No results for input(s): LIPASE, AMYLASE in the last 168 hours. No results for input(s): AMMONIA in the last 168 hours. Coagulation Profile: Recent Labs  Lab 04/29/18 1219  INR 1.15   Cardiac Enzymes: No results for input(s): CKTOTAL, CKMB, CKMBINDEX, TROPONINI in the last 168 hours. BNP (last 3 results) No results for input(s): PROBNP in the last 8760 hours. HbA1C: No results for input(s): HGBA1C in the last 72  hours. CBG: No results for input(s): GLUCAP in the last 168 hours. Lipid Profile: No results for input(s): CHOL, HDL, LDLCALC, TRIG, CHOLHDL, LDLDIRECT in the last 72 hours. Thyroid Function Tests: No results for input(s): TSH, T4TOTAL, FREET4, T3FREE, THYROIDAB in the last 72 hours. Anemia Panel: Recent Labs    04/29/18 1219  VITAMINB12 748  FOLATE 8.2  FERRITIN 356*  TIBC 128*  IRON 23*  RETICCTPCT 0.8   Sepsis Labs: No results for input(s): PROCALCITON, LATICACIDVEN in the last 168 hours.  Recent Results (from the past 240 hour(s))  MRSA PCR Screening     Status: None   Collection Time: 04/29/18 11:52 AM  Result Value Ref Range Status   MRSA by PCR NEGATIVE NEGATIVE Final    Comment:        The GeneXpert MRSA Assay (FDA approved  for NASAL specimens only), is one component of a comprehensive MRSA colonization surveillance program. It is not intended to diagnose MRSA infection nor to guide or monitor treatment for MRSA infections. Performed at [facility], [place] [address] [place], [place] [address]      Radiology Studies: No results found.  Scheduled Meds: . nicotine  21 mg Transdermal Daily  . QUEtiapine  25 mg Oral QHS   Continuous Infusions: . sodium chloride 50 mL/hr at 04/30/18 0800  . lactated ringers 20 mL/hr at 04/30/18 0800     LOS: 1 day   Marylu Lund, MD [facility] Pager On Amion  If 7PM-7AM, please contact night-coverage 04/30/2018, 1:58 PM

## 2018-04-30 NOTE — Evaluation (Signed)
Physical Therapy Evaluation Patient Details Name: STEPHENS SHREVE MRN: [id number] DOB: Sep 05, 1955 Today's Date: 04/30/2018   History of Present Illness  Pt was admitted for biopsy of lung mass; has hilar and mediastinal adenopathy. Recently admitted with weakness, near syncopal episode, dehydration. PMH includes ARF, cachexia, tobacco abuse, previous alcohol abuse.   Clinical Impression   Pt presents with cardiovascular deconditioning seen in limited mobility, decreased LE and UE strength, unsteadiness in standing, and increased time and effort to perform bed mobility and transfers. Pt to benefit from acute PT to address deficits. Pt ambulated short room distance today, PT and OT decided to limit mobility given pt's low BP in sitting (88/52) and standing (81/48). PT to continue to follow acutely. PT currently recommending SNF given current presentation, but will continue to monitor progress and update plan as needed.     Follow Up Recommendations SNF;Supervision/Assistance - 24 hour    Equipment Recommendations  None recommended by PT    Recommendations for Other Services       Precautions / Restrictions Precautions Precautions: Fall Restrictions Weight Bearing Restrictions: No      Mobility  Bed Mobility Overal bed mobility: Needs Assistance Bed Mobility: Supine to Sit     Supine to sit: Min guard     General bed mobility comments: Min guard for safety, increased time and effort to get to EOB   Transfers Overall transfer level: Needs assistance Equipment used: Rolling walker (2 wheeled) Transfers: Sit to/from [facility] Sit to Stand: Min assist Stand pivot transfers: Min guard       General transfer comment: Min assist for power up and steadying upon standing. Verbal cuing for hand placement on RW. Stand pivot to move from recliner to [facility].   Ambulation/Gait Ambulation/Gait assistance: Min guard Gait Distance (Feet): 5 Feet Assistive device:  Rolling walker (2 wheeled) Gait Pattern/deviations: Step-through pattern;Decreased stride length;Trunk flexed Gait velocity: decr    General Gait Details: short ambulation distance to get to chair in room. Verbal cuing for hand placement when moving from standing to sitting in recliner after ambulation.   Stairs            Wheelchair Mobility    Modified Rankin (Stroke Patients Only)       Balance Overall balance assessment: Needs assistance Sitting-balance support: No upper extremity supported;Feet supported Sitting balance-Leahy Scale: Good     Standing balance support: Bilateral upper extremity supported Standing balance-Leahy Scale: Poor Standing balance comment: relies on RW and steadying from PT in standing and during ambulation                              Pertinent Vitals/Pain Pain Assessment: 0-10 Pain Score: 1  Pain Location: bilat arms, IV sites Pain Descriptors / Indicators: Sore Pain Intervention(s): Limited activity within patient's tolerance;Monitored during session    Home Living Family/patient expects to be discharged to:: Private residence Living Arrangements: Alone   Type of Home: House Home Access: Stairs to enter Entrance Stairs-Rails: None Entrance Stairs-Number of Steps: 1+1 Home Layout: One level Home Equipment: Environmental consultant - 2 wheels Additional Comments: poor living conditions per notes    Prior Function Level of Independence: Independent         Comments: bought walker recently but hasn't been using it     Hand Dominance        Extremity/Trunk Assessment   Upper Extremity Assessment Upper Extremity Assessment: Defer to OT evaluation  Lower Extremity Assessment Lower Extremity Assessment: RLE deficits/detail;LLE deficits/detail RLE Deficits / Details: MMT hip flexion 3+/5, knee extension 3-/5, knee flexion 3+/5  LLE Deficits / Details: MMT hip flexion 3+/5, knee extension 3+/5, knee flexion 3+/5     Cervical  / Trunk Assessment Cervical / Trunk Assessment: Other exceptions Cervical / Trunk Exceptions: forward head, rounded shoulders   Communication   Communication: No difficulties  Cognition Arousal/Alertness: Awake/alert Behavior During Therapy: WFL for tasks assessed/performed Overall Cognitive Status: Within Functional Limits for tasks assessed                                        General Comments General comments (skin integrity, edema, etc.): BP in sitting: 88/52, standing BP: 81/48. Pt asymptomatic, with no complaints of dizziness or feeling faint.     Exercises     Assessment/Plan    PT Assessment Patient needs continued PT services  PT Problem List Decreased strength;Decreased activity tolerance;Decreased knowledge of use of DME;Decreased balance;Decreased safety awareness;Decreased mobility       PT Treatment Interventions DME instruction;Therapeutic activities;Gait training;Therapeutic exercise;Patient/family education;Stair training;Balance training;Functional mobility training    PT Goals (Current goals can be found in the Care Plan section)  Acute Rehab PT Goals Patient Stated Goal: none stated; agreeable to OOB PT Goal Formulation: With patient Time For Goal Achievement: 05/14/18 Potential to Achieve Goals: Good    Frequency Min 2X/week   Barriers to discharge Other (comment) Pt lives alone, no assist     Co-evaluation   Reason for Co-Treatment: For patient/therapist safety PT goals addressed during session: Mobility/safety with mobility OT goals addressed during session: ADL's and self-care       AM-PAC PT "6 Clicks" Daily Activity  Outcome Measure Difficulty turning over in bed (including adjusting bedclothes, sheets and blankets)?: Unable Difficulty moving from lying on back to sitting on the side of the bed? : Unable Difficulty sitting down on and standing up from a chair with arms (e.g., wheelchair, bedside commode, etc,.)?:  Unable Help needed moving to and from a bed to chair (including a wheelchair)?: A Little Help needed walking in hospital room?: A Little Help needed climbing 3-5 steps with a railing? : A Little 6 Click Score: 12    End of Session Equipment Utilized During Treatment: Gait belt Activity Tolerance: Patient tolerated treatment well Patient left: with chair alarm set;with call bell/phone within reach Nurse Communication: Mobility status PT Visit Diagnosis: Unsteadiness on feet (R26.81);Difficulty in walking, not elsewhere classified (R26.2)    Time: 7793-9030 PT Time Calculation (min) (ACUTE ONLY): 24 min   Charges:   PT Evaluation $PT Eval Low Complexity: 1 Low          Tradarius Reinwald Conception Chancy, PT Acute Rehabilitation Services Pager [phone]  Office [phone]  Retina Bernardy D Jathan Balling 04/30/2018, 4:03 PM

## 2018-04-30 NOTE — Consult Note (Signed)
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   HISTORY OF PRESENT ILLNESS: Warren Blankenship is a 62 y.o. male seen at the request of Dr. Valeta Harms for a new diagnosis of NSCLC. The patient was recently hospitalized for recent weakness and was hospitalized in August for dehydration, weight loss, AKI, and lactic acidosis. He did not have CT performed given negative CXR. He was seen again in the ED on 04/11/18 due to progressive symptoms and cough. CT chest with contrast revealed fullness in the right hilum measuring 8.1 x 4 x 6.6 cm,a nd this caused mass effect on the right mainstem and right middle lobe bronchus, and concerns for SVC compression. He also has hilar, infrahilar, and soft tissue density concerning for adenopathy anterior to the right ventricle. There were also tree in bud opacities into the right upper lobe, and a 4 mm apical nodule. He has a prominent schmorl's node in the T7 superior endplate. He did have have Abd/pelvic imaging. He did have an MRI of the brain on 04/12/18 which did not reveal metastatic disease. He was scheduled for outpatient PET on 9/25 and biopsy with Dr. Valeta Harms but no showed. He was readmitted yesterday following bronch/EBUS with Dr. Valeta Harms given hypotension, and failure to thrive. We have been following his course and presented at bedside to discuss his case. In speaking with Dr. Valeta Harms, preliminary pathology from the main tumor is NSCLC. He anticipates the adenopathy to also be involved.    PREVIOUS RADIATION THERAPY: No   PAST MEDICAL HISTORY:  Past Medical History:  Diagnosis Date  . Mass of right lung   . Tobacco abuse        PAST SURGICAL HISTORY: Past  Surgical History:  Procedure Laterality Date  . BIOPSY  04/29/2018   Procedure: BIOPSY;  Surgeon: Garner Nash, DO;  Location: [facility];  Service: Cardiopulmonary;;  . BRONCHIAL WASHINGS  04/29/2018   Procedure: BRONCHIAL WASHINGS;  Surgeon: Garner Nash, DO;  Location: [facility];  Service: Cardiopulmonary;;  . ENDOBRONCHIAL ULTRASOUND Bilateral 04/29/2018   Procedure: ENDOBRONCHIAL ULTRASOUND;  Surgeon: Garner Nash, DO;  Location: [facility];  Service: Cardiopulmonary;  Laterality: Bilateral;  . Endobronchial Ultrasound (EBUS), Mediastinum  Bilateral 04/29/2018   Dr. Valeta Harms   . FINE NEEDLE ASPIRATION BIOPSY  04/29/2018   Procedure: FINE NEEDLE ASPIRATION BIOPSY;  Surgeon: Garner Nash, DO;  Location: [facility];  Service: Cardiopulmonary;;  . FLEXIBLE BRONCHOSCOPY  04/29/2018   Procedure: FLEXIBLE BRONCHOSCOPY;  Surgeon: Garner Nash, DO;  Location: [facility];  Service: Cardiopulmonary;;  . TONSILLECTOMY       FAMILY HISTORY:  Family History  Problem Relation Age of Onset  . Lung disease Father      SOCIAL HISTORY:  reports that he has been smoking. He has been smoking about 1.00 pack per day. He has never used smokeless tobacco. He reports that he drinks alcohol. He reports that he has current or past drug history. The patient is single he has a son. He used to work in a [employer]. He reports he stopped drinking  Liquor and Beer over a year ago.    ALLERGIES: Patient has no known allergies.   MEDICATIONS:  Current Facility-Administered Medications  Medication Dose Route Frequency Provider Last Rate Last Dose  . 0.9 %  sodium chloride infusion   Intravenous Continuous Mercy Riding, MD 50 mL/hr at 04/30/18 0707    . acetaminophen (TYLENOL) tablet 650 mg  650 mg Oral Q6H PRN Mercy Riding, MD       Or  . acetaminophen (TYLENOL) suppository 650 mg  650 mg Rectal Q6H PRN Gonfa, Taye T, MD      .  ipratropium-albuterol (DUONEB) 0.5-2.5 (3) MG/3ML nebulizer solution 3 mL  3 mL Nebulization Q4H PRN Gonfa, Taye T, MD      . lactated ringers infusion   Intravenous Continuous Gonfa, Taye T, MD 20 mL/hr at 04/30/18 0737    . nicotine (NICODERM CQ - dosed in mg/24 hours) patch 21 mg  21 mg Transdermal Daily Wendee Beavers T, MD   21 mg at 04/30/18 0859  . ondansetron (ZOFRAN) tablet 4 mg  4 mg Oral Q6H PRN Gonfa, Taye T, MD       Or  . ondansetron (ZOFRAN) injection 4 mg  4 mg Intravenous Q6H PRN Gonfa, Taye T, MD      . oxyCODONE (Oxy IR/ROXICODONE) immediate release tablet 5 mg  5 mg Oral Q4H PRN Gonfa, Taye T, MD      . polyethylene glycol (MIRALAX / GLYCOLAX) packet 17 g  17 g Oral Daily PRN Wendee Beavers T, MD      . QUEtiapine (SEROQUEL) tablet 25 mg  25 mg Oral QHS Kipp Brood, MD   25 mg at 04/29/18 2227  . traZODone (DESYREL) tablet 25 mg  25 mg Oral QHS PRN Mercy Riding, MD         REVIEW OF SYSTEMS: On review of systems, the patient reports that he is feeling okay. He reports his breathing is good now and he was just titrated from PACU at 4L to now 3L [place]. He denies chest pain, shortness of breath, cough, fevers, chills, night sweats. He stopped drinking more than a year ago. He reports he used to drink a 12 pack sometimes per day. He also gave up liquor 6 months prior to giving up beer. He reports about a 20-25 pound weight loss in the last 6 months and states it has been due to poor appetite. He denies any bowel or bladder disturbances, and denies abdominal pain, nausea or vomiting. He denies any new musculoskeletal or joint aches or pains. A complete review of systems is obtained and is otherwise negative.     PHYSICAL EXAM:  Wt Readings from Last 3 Encounters:  04/29/18 99 lb 3.3 oz (45 kg)  04/12/18 98 lb 5.2 oz (44.6 kg)  03/19/18 100 lb (45.4 kg)   Temp Readings from Last 3 Encounters:  04/30/18 (!) 97.4 F (36.3 C) (Oral)  04/13/18 99.1 F (37.3 C) (Oral)  03/20/18 98.7  F (37.1 C) (Oral)   BP Readings from Last 3 Encounters:  04/30/18 (!) 79/42  04/13/18 112/67  03/20/18 (!) 93/51   Pulse Readings from Last 3 Encounters:  04/30/18 86  04/13/18 91  03/20/18 75   Pain Assessment Pain Score: Asleep/10  In general this is a chronically appearing caucasian male in no acute distress. He is cachectic appearing, but is alert and oriented x4 and appropriate throughout the examination. HEENT reveals that the patient is normocephalic, atraumatic. EOMs are  intact. Right sided JVD is appreciated, and collateral veins are mildly appreciated over the precordium. He has visible brachiocephalic vein at the [facility] and collateral vessels visible along his right upper extremity. No edema of the soft tissue of the neck, or of the face is appreciated. Skin is intact without any evidence of gross lesions. Cardiovascular exam reveals a regular rate and rhythm, no clicks rubs or murmurs are auscultated. Chest is clear to auscultation bilaterally. Lymphatic assessment is performed and does not reveal any adenopathy in the cervical, or supraclavicular chains.   ECOG = 3  0 - Asymptomatic (Fully active, able to carry on all predisease activities without restriction)  1 - Symptomatic but completely ambulatory (Restricted in physically strenuous activity but ambulatory and able to carry out work of a light or sedentary nature. For example, light housework, office work)  2 - Symptomatic, <50% in bed during the day (Ambulatory and capable of all self care but unable to carry out any work activities. Up and about more than 50% of waking hours)  3 - Symptomatic, >50% in bed, but not bedbound (Capable of only limited self-care, confined to bed or chair 50% or more of waking hours)  4 - Bedbound (Completely disabled. Cannot carry on any self-care. Totally confined to bed or chair)  5 - Death   Eustace Pen MM, Creech RH, Tormey DC, et al. [id number]). "Toxicity and response criteria of the [facility]". [place] Oncol. 5 (6): 649-55    LABORATORY DATA:  Lab Results  Component Value Date   WBC 7.6 04/30/2018   HGB 7.9 (L) 04/30/2018   HCT 25.7 (L) 04/30/2018   MCV 86.2 04/30/2018   PLT 394 04/30/2018   Lab Results  Component Value Date   NA 137 04/30/2018   K 4.2 04/30/2018   CL 100 04/30/2018   CO2 29 04/30/2018   Lab Results  Component Value Date   ALT 15 04/29/2018   AST 38 04/29/2018   ALKPHOS 81 04/29/2018   BILITOT 0.6 04/29/2018      RADIOGRAPHY: Dg Chest 2 View  Result Date: 04/11/2018 CLINICAL DATA:  Cough EXAM: CHEST - 2 VIEW COMPARISON:  03/19/2018 FINDINGS: Hyperinflated lungs. No focal airspace disease or effusion. Stable cardiomediastinal silhouette with aortic atherosclerosis. Enlarged right hilar vessels. No pneumothorax. IMPRESSION: Hyperinflation. Asymmetric right hilar fullness could be due to enlarged hilar vessels. Chest CT could obtain to exclude hilar mass. No acute pulmonary infiltrate. Electronically Signed   By: Donavan Foil M.D.   On: 04/11/2018 23:10   Ct Chest W Contrast  Result Date: 04/12/2018 CLINICAL DATA:  Right hilar fullness on x-ray. Cough. Current smoker. EXAM: CT CHEST WITH CONTRAST TECHNIQUE: Multidetector CT imaging of the chest was performed during intravenous contrast administration. CONTRAST:  137m ISOVUE-300 IOPAMIDOL (ISOVUE-300) INJECTION 61% COMPARISON:  Radiograph earlier this day. FINDINGS: Cardiovascular: Aortic atherosclerosis without aneurysm. The heart is normal in size. There are coronary artery calcifications. 14 mm soft tissue density anterior to the right ventricle may be loculated pericardial fluid versus adenopathy. Mediastinum/Nodes: Contiguous right hilar soft tissue density likely combination of central mass and hilar adenopathy is contiguous with anterior paratracheal adenopathy, with soft tissue conglomerate measuring 8.1 x 4.0 x 6.6 cm. This causes mass effect on the right mainstem  and right middle lobe bronchus. Additional right inferior hilar adenopathy with node measuring 15 mm in the infrahilar region. 14 mm soft tissue density anterior to the right ventricle may be epicardial adenopathy or loculated pericardial  fluid. No left hilar adenopathy. No supraclavicular adenopathy. Thyroid gland is diminutive. The esophagus is nondistended. Lungs/Pleura: Right hilar soft tissue density likely combination of central pulmonary lesion and enlarged lymph nodes. Adjacent micronodularity extends into the right upper and right middle lobes with associated septal thickening. There tree in bud opacities extending into the right upper lobe. 4 mm right apical nodule image 39 series 7. Moderate emphysema and central bronchial thickening. Dependent opacity in the right lower lobe likely atelectasis. No pleural fluid. Upper Abdomen: No adrenal mass. 12 mm soft tissue nodule in the left upper quadrant adjacent to the splenic flexure of the colon has similar density to the adjacent spleen and is likely a splenule but nonspecific. No focal hepatic mass. Musculoskeletal: No blastic or destructive lytic osseous lesions. Remote left lateral rib fractures. Prominent Schmorl's node in the superior endplate of T7. paucity of subcutaneous fat suggesting cachexia. IMPRESSION: 1. Abnormal right hilar soft tissue density likely combination of central lung mass and adenopathy, appears contiguous with anterior paratracheal adenopathy. This conglomerate soft tissue measures 8.1 x 4.0 x 6.6 cm, unable to differentiate primary malignancy from adenopathy. There is adjacent septal thickening and micronodularity which may be lymphangitic spread of tumor. This causes mass effect on the right mainstem and right middle lobe bronchi. 2. Soft tissue density anterior to the right ventricle may be loculated pericardial fluid or epicardial adenopathy. 3. Tree in bud opacity in the right upper and to lesser extent right middle lobe may be  infectious, inflammatory, postobstructive or malignant. Nonspecific 4 mm right apical pulmonary nodule. 4. Moderate emphysema and central bronchial thickening. Aortic Atherosclerosis (ICD10-I70.0) and Emphysema (ICD10-J43.9). Electronically Signed   By: Keith Rake M.D.   On: 04/12/2018 01:18   Mr Jeri Cos YD Contrast  Result Date: 04/12/2018 CLINICAL DATA:  Small-cell lung cancer.  Staging. EXAM: MRI HEAD WITHOUT AND WITH CONTRAST TECHNIQUE: Multiplanar, multiecho pulse sequences of the brain and surrounding structures were obtained without and with intravenous contrast. CONTRAST:  4.5 cc Gadavist COMPARISON:  None. FINDINGS: Sequences vary from mild to moderately motion degraded. INTRACRANIAL CONTENTS: No reduced diffusion to suggest acute ischemia or hypercellular tumor. No susceptibility artifact to suggest hemorrhage. Moderate ventriculomegaly in the base of global parenchymal brain volume loss. Patchy supratentorial white matter FLAIR T2 hyperintensities. No suspicious parenchymal signal, masses, mass effect. No abnormal intraparenchymal or extra-axial enhancement. No abnormal extra-axial fluid collections. No extra-axial masses. VASCULAR: Normal major intracranial vascular flow voids present at skull base. SKULL AND UPPER CERVICAL SPINE: No abnormal sellar expansion. No suspicious calvarial bone marrow signal. Craniocervical junction maintained. SINUSES/ORBITS: RIGHT mastoid effusion. Mild paranasal sinus mucosal thickening without air-fluid levels. Included ocular globes and orbital contents are non-suspicious. OTHER: Patient is edentulous. IMPRESSION: 1. Motion degraded examination.  No intracranial metastasis. 2. Moderate parenchymal brain volume loss, advanced for age. 3. Mild chronic small vessel ischemic changes. Electronically Signed   By: Elon Alas M.D.   On: 04/12/2018 22:06       IMPRESSION/PLAN: 1. 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 Dr. Lisbeth Renshaw has reviewed the patient's case on multiple dates. Unfortunately getting the patient in to our office has been challenging due to his social circumstances. His friend [name] has been instrumental in getting him to Dr. Juline Patch attention and we will work together to ensure he has access to care. The patient is aware of the work up thus far and we discussed the treatment recommendations for a new  diagnosis of probable advanced lung disease. He will meet with Dr. Julien Nordmann as well hopefully during this hospitalization, and we discussed the utility of urgent radiotherapy. He has been clinically stable enough as an outpatient up to this point, and Dr. Lisbeth Renshaw feels that his treatment should begin very soon, but at the same time this does not appear to be an emergent process today. We discussed simulation for radiotherapy tomorrow to the right hilum and regional nodes. We anticipate starting treatment on Wednesday. We discussed the risks, benefits, short, and long term effects of radiotherapy, and the patient is interested in proceeding. Dr. Lisbeth Renshaw discusses the delivery and logistics of radiotherapy and anticipates a course of 6 1/2 weeks of radiotherapy. We will sign consent tomorrow in our department when he arrives.  2. Social needs. We appreciate case management and social work's assistance. I believe the plan is for skilled placement.  3. Severe protein calorie malnutrition. He will need further long term recommendations from nutrition as well given the anticipated esophagitis from radiotherapy in this setting. He may benefit from enteral feeds as well, but this can be further discussed once there is a more concrete plan for his oncology care.  In a visit lasting 70 minutes, greater than 50% of the time was spent face to face discussing his case and in floor time, coordinating the patient's care.    Carola Rhine, PAC

## 2018-04-30 NOTE — Progress Notes (Signed)
Initial Nutrition Assessment  DOCUMENTATION CODES:   Underweight  INTERVENTION:  Boost Plus TID   NUTRITION DIAGNOSIS:   Inadequate oral intake related to cancer and cancer related treatments(stage IIILC and failure to thrive) as evidenced by energy intake < or equal to 75% for > or equal to 1 month, other (comment)(per MD note and pt stated living conditions)  GOAL:   Patient will meet greater than or equal to 90% of their needs   MONITOR:   PO intake, Supplement acceptance, Weight trends  REASON FOR ASSESSMENT:   Consult Assessment of nutrition requirement/status  ASSESSMENT:  Pt admit 9/30 s/p bronch/EBUS w/ hypotension and failure to thrive. Prior admission dates 9/12-9/13, 8/20-8/21. Per MD note, pt with probable stage IIIB, NSCLC of rt hilum.   Nurse and team placing cath at visit. Pt not awake during this time. Will continue to monitor for request for EN recs.    Labs reviewed: Glucose 128 (H), Cr 0.59 (L)   NUTRITION - FOCUSED PHYSICAL EXAM:   Diet Order:   Diet Order            Diet regular Room service appropriate? Yes; Fluid consistency: Thin  Diet effective now              EDUCATION NEEDS:   No education needs have been identified at this time  Skin:  Skin Assessment: Reviewed RN Assessment(dry; sacrum red but blanchable)  Last BM:  9/26 WDL  Height:   Ht Readings from Last 1 Encounters:  04/29/18 5\' 7"  (1.702 m)    Weight:   Wt Readings from Last 1 Encounters:  04/29/18 45 kg    Ideal Body Weight:  67.3 kg  BMI:  Body mass index is 15.54 kg/m.  Estimated Nutritional Needs:   Kcal:  1575-1800 (35-40kg/kcal)  Protein:  68-81 (1.5-1.8g/kg)  Fluid:  1.6L    Warren Blankenship, RD, LDN  After Hours/Weekend Pager: [phone]

## 2018-04-30 NOTE — Progress Notes (Signed)
NAME:  Warren Blankenship, MRN:  [id number], DOB:  1956/07/02, LOS: 1 ADMISSION DATE:  04/29/2018, CONSULTATION DATE:  04/30/2018  REFERRING MD:  Dr. Cyndia Skeeters, CHIEF COMPLAINT:  Lung mass, failure to thrive   Brief History   62 yo male, large mediastinal mass and associated adenopathy s/p EBUS TBNA with preliminary rapid path read concerning for NSCLC, mild SVC compression on CT imaging and unsafe living environment.   Past Medical History  Tobacco abuse, non-smoker, significant unintentional weight loss  Significant Hospital Events   04/29/2018: Endobronchial ultrasound-guided transbronchial needle aspiration  Consults: date of consult/date signed off & final recs:  04/29/2018: Pulmonary  Procedures (surgical and bedside):  04/29/2018: Endobronchial ultrasound-guided transbronchial needle aspiration  Significant Diagnostic Tests:  04/29/2018: Endobronchial ultrasound-guided transbronchial needle aspiration, preliminary pathology read consistent with non-small cell lung cancer  Micro Data:  None  Antimicrobials:  None  Subjective:  Doing well this morning. Happy to have food. Looks better this morning.   Objective   Blood pressure (!) 79/42, pulse 86, temperature (!) 97.4 F (36.3 C), temperature source Oral, resp. rate 19, height 5\' 7"  (1.702 m), weight 45 kg, SpO2 97 %.        Intake/Output Summary (Last 24 hours) at 04/30/2018 0908 Last data filed at 04/30/2018 0707 Gross per 24 hour  Intake 941.17 ml  Output 1905 ml  Net -963.83 ml   Filed Weights   04/29/18 0640 04/29/18 1200  Weight: 44.6 kg 45 kg    Examination: General appearance: 62 y.o., male, frail, cachectic, muscle wasting  Eyes: anicteric sclerae, moist conjunctivae; tracking appropriately  HENT: NCAT; oropharynx, MM dry  Neck: Trachea midline; visible JVP  Lungs: diminsihed in the R>L, no wheeze  CV: RRR, S1, S2, no MRGs  Abdomen: Soft, non-tender; non-distended, BS present  Extremities: thin muscle wasting  no edema  Skin: Normal temperature, turgor and texture; no rash Psych: flat affect, mood is better today  Neuro: Alert and oriented to person and place, no focal deficit    Resolved Hospital Problem list   N/a  Assessment & Plan:   Large mediastinal mass encompassing the right hilum, right and left anterior paratracheal, subcarinal locations with associated hilar and mediastinal adenopathy.  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  The patient does not have any arm swelling, chest pain dilatation or facial swelling. Ebony Hail from [facility] saw the patient yesterday. She states they will make plans to arrange for him to get XRT.  - I believe making plans for today at 1030AM - We appreciate the care and arrangements made by oncology services.   Failure to thrive, unable to care for himself at home Unsafe living environment - agree with CM and SWS to help with home/dc arrangements, possible candidate for rehab   Severe protein caloric malnutrition, probable nutritional deficiencies   Low blood pressure, normal mentation, no sign of end organ dysfunction   Acute hypoxemic respiratory failure requiring nasal cannula O2 supplementation Probable COPD - wean FIO2, maintain sats >88%  - may need O2 at discharge   Disposition / Summary of Today's Plan 04/30/18   Radiation oncology to see patient and make plans. Going down at 1030AM     Diet: Advance as tolerated Pain/Anxiety/Delirium protocol (if indicated): Not applicable VAP protocol (if indicated): Applicable DVT prophylaxis: Okay for chemical GI prophylaxis: Not applicable Hyperglycemia protocol: Not applicable Mobility: advance as tolerated  Code  Status: FULL  Family Communication: Friend Judy  Labs   CBC: Recent Labs  Lab 04/29/18 1219 04/30/18 0322  WBC 5.5 7.6  NEUTROABS 4.8  --   HGB 8.9* 7.9*  HCT 28.8* 25.7*  MCV  87.3 86.2  PLT 380 332    Basic Metabolic Panel: Recent Labs  Lab 04/29/18 1219 04/29/18 1427 04/30/18 0322  NA 133*  --  137  K 3.9  --  4.2  CL 95*  --  100  CO2 28  --  29  GLUCOSE 173*  --  128*  BUN 13  --  11  CREATININE 0.58*  --  0.59*  CALCIUM 8.5*  --  8.3*  MG 1.4* 1.5*  --   PHOS 2.9  --   --    GFR: Estimated Creatinine Clearance: 59.4 mL/min (A) (by C-G formula based on SCr of 0.59 mg/dL (L)). Recent Labs  Lab 04/29/18 1219 04/30/18 0322  WBC 5.5 7.6    Liver Function Tests: Recent Labs  Lab 04/29/18 1219  AST 38  ALT 15  ALKPHOS 81  BILITOT 0.6  PROT 6.9  ALBUMIN 2.0*   No results for input(s): LIPASE, AMYLASE in the last 168 hours. No results for input(s): AMMONIA in the last 168 hours.  ABG    Component Value Date/Time   PHART 7.478 (H) 04/12/2018 1402   PCO2ART 36.0 04/12/2018 1402   PO2ART 76.8 (L) 04/12/2018 1402   HCO3 26.3 04/12/2018 1402   O2SAT 95.1 04/12/2018 1402     Coagulation Profile: Recent Labs  Lab 04/29/18 1219  INR 1.15    Cardiac Enzymes: No results for input(s): CKTOTAL, CKMB, CKMBINDEX, TROPONINI in the last 168 hours.  HbA1C: No results found for: HGBA1C  CBG: No results for input(s): GLUCAP in the last 168 hours.  Admitting History of Present Illness.   This is a 62 year old gentleman recently diagnosed via CT imaging with a large mediastinal paratracheal mass with associated hilar and mediastinal adenopathy.  The patient was originally seen in the hospital [facility] at [facility] approximately 2 weeks ago and discharged over the weekend with planned outpatient follow-up.  Patient follow-up bronchoscopy did not show.  We reached out to every number that was available in the chart and eventually able to track down a friend that was willing to go check on him.  We even had [employer] his home to find him.  Ultimately the patient did show for his bronchoscopy today.  He is joined by his friend  daily.  After speaking with [name] she states that the patient was so disheveled that she did not feel safe to return home.  She stated that she had to leave the door open because there was bugs and cockroaches and animals inside the home.  [name] said that she removed 8 of decaying trash from patient's bedroom.  After discussion with the patient in endoscopy and the fact that he is requiring 4 L of oxygen and moderate low blood pressures.  The decision was made to contact hospitalist for admission.  I have updated Norton Blizzard our thoracic oncology coordinator.    _________________________________________________________   Garner Nash, DO [facility] 04/30/2018 9:08 AM  Personal pager: [phone] If unanswered, please page CCM On-call: [phone]

## 2018-04-30 NOTE — Progress Notes (Signed)
OT Cancellation Note  Patient Details Name: Warren Blankenship MRN: [id number] DOB: July 29, 1956   Cancelled Treatment:    Reason Eval/Treat Not Completed: Patient at procedure or test/ unavailable. Pt at XRT this am. Will check back in pm  Addisson Frate 04/30/2018, 11:48 AM  Lesle Chris, OTR/L Acute Rehabilitation Services [phone] [facility] pager [phone] office 04/30/2018

## 2018-05-01 ENCOUNTER — Ambulatory Visit
Admit: 2018-05-01 | Discharge: 2018-05-01 | Disposition: A | Discharge status: Home / Self Care | Payer: Self-pay | Attending: Radiation Oncology | Admitting: Radiation Oncology

## 2018-05-01 DIAGNOSIS — D638 Anemia in other chronic diseases classified elsewhere: Secondary | ICD-10-CM

## 2018-05-01 DIAGNOSIS — F17210 Nicotine dependence, cigarettes, uncomplicated: Secondary | ICD-10-CM

## 2018-05-01 DIAGNOSIS — C3401 Malignant neoplasm of right main bronchus: Secondary | ICD-10-CM

## 2018-05-01 LAB — CBC
HEMATOCRIT: 24.6 % — AB (ref 39.0–52.0)
HEMOGLOBIN: 7.6 g/dL — AB (ref 13.0–17.0)
MCH: 26.9 pg (ref 26.0–34.0)
MCHC: 30.9 g/dL (ref 30.0–36.0)
MCV: 86.9 fL (ref 78.0–100.0)
Platelets: 373 10*3/uL (ref 150–400)
RBC: 2.83 MIL/uL — AB (ref 4.22–5.81)
RDW: 17 % — AB (ref 11.5–15.5)
WBC: 8.1 10*3/uL (ref 4.0–10.5)

## 2018-05-01 LAB — MAGNESIUM: MAGNESIUM: 1.6 mg/dL — AB (ref 1.7–2.4)

## 2018-05-01 MED ORDER — NICOTINE 7 MG/24HR TD PT24
7.0000 mg | MEDICATED_PATCH | Freq: Every day | TRANSDERMAL | Status: DC
  Administered 2018-05-02 – 2018-05-03 (×2): 7 mg via TRANSDERMAL
  Filled 2018-05-01 (×2): qty 1

## 2018-05-01 MED ORDER — MAGNESIUM SULFATE 2 GM/50ML IV SOLN
2.0000 g | Freq: Once | INTRAVENOUS | Status: AC
  Administered 2018-05-01: 2 g via INTRAVENOUS
  Filled 2018-05-01: qty 50

## 2018-05-01 NOTE — Progress Notes (Signed)
Pt requesting that staff reach out and call girlfriend with SNF placement needs/details. [name] [phone].

## 2018-05-01 NOTE — Progress Notes (Addendum)
NAME:  Warren Blankenship, MRN:  [id number], DOB:  1956-06-14, LOS: 2 ADMISSION DATE:  04/29/2018, CONSULTATION DATE:  05/01/2018  REFERRING MD:  Dr. Cyndia Skeeters, CHIEF COMPLAINT:  Lung mass, failure to thrive   Brief History   62 yo male, large mediastinal mass and associated adenopathy s/p EBUS TBNA with preliminary rapid path read concerning for NSCLC, mild SVC compression on CT imaging and unsafe living environment.   Past Medical History  Tobacco abuse, non-smoker, significant unintentional weight loss  Significant Hospital Events   04/29/2018: Endobronchial ultrasound-guided transbronchial needle aspiration, final pathology poorly differentiated carcinoma   Consults: date of consult/date signed off & final recs:  04/29/2018: Pulmonary  Procedures (surgical and bedside):  04/29/2018: Endobronchial ultrasound-guided transbronchial needle aspiration  Significant Diagnostic Tests:  04/29/2018: Endobronchial ultrasound-guided transbronchial needle aspiration, preliminary pathology read consistent with non-small cell lung cancer 04/30/2018: First visit to radiation oncology   Micro Data:  None  Antimicrobials:  None  Subjective:  Eating early dinner this afternoon. Up eating pizza and watching a movie. He feels like he doing better.   Objective   Blood pressure (!) 95/54, pulse 83, temperature 98 F (36.7 C), temperature source Oral, resp. rate 16, height 5\' 7"  (1.702 m), weight 45 kg, SpO2 99 %.        Intake/Output Summary (Last 24 hours) at 05/01/2018 1524 Last data filed at 05/01/2018 1505 Gross per 24 hour  Intake 1456.78 ml  Output 1575 ml  Net -118.22 ml   Filed Weights   04/29/18 0640 04/29/18 1200  Weight: 44.6 kg 45 kg    Examination: General appearance: 62 y.o., male, NAD, doing well, chronically ill appearing, significant weight loss  Eyes: anicteric sclerae HENT: NCAT; oropharynx, MMM Neck: Trachea midline Lungs: diminished bilaterally, no crackles, no wheeze CV:  RRR, S1, S2, no MRGs  Abdomen: Soft, non-tender; non-distended, BS present  Extremities: muscle wasting, thin  Skin: Normal temperature, turgor and texture; no rash Psych: Appropriate affect, normal mood  Neuro: alert following commands    Resolved Hospital Problem list   N/a  Assessment & Plan:   Poorly Differentiated Carcinoma of the Lung, Large mediastinal mass encompassing the right hilum, right and left anterior paratracheal, subcarinal locations with associated hilar and mediastinal adenopathy.  - I have informed his oncology team of his final pathology results  - discussed care and placement plans with his primary physician - I agree that SNF placement will help him go to the needed follow up appointments over the next few weeks and radiation treatments  Failure to thrive, unable to care for himself at home Unsafe living environment - appreciate help from CM/SWS and hospitalist on obtaining placement at SNF  Severe protein caloric malnutrition, probable nutritional deficiencies  - advancing diet, he seems to be eating well   Low blood pressure, normal mentation, no sign of end organ dysfunction  - stable observing   Acute hypoxemic respiratory failure requiring nasal cannula O2 supplementation Probable COPD - will need to be walked prior to DC to make sure we establish his O2 needs.  Pulmonary will sign off at this time. Please call with any questions. He can see me in clinic in 4 weeks.   Disposition / Summary of Today's Plan 05/01/18   Working up disposition for SNF placement per primary team     Diet: Advance as tolerated Pain/Anxiety/Delirium protocol (if indicated): Not applicable VAP protocol (if indicated): Applicable DVT prophylaxis: Okay for chemical GI prophylaxis: Not applicable Hyperglycemia protocol: Not applicable  Mobility: advance as tolerated  Code Status: FULL  Family Communication: Friend Judy  Labs   CBC: Recent Labs  Lab 04/29/18 1219  04/30/18 0322 05/01/18 0432  WBC 5.5 7.6 8.1  NEUTROABS 4.8  --   --   HGB 8.9* 7.9* 7.6*  HCT 28.8* 25.7* 24.6*  MCV 87.3 86.2 86.9  PLT 380 394 703    Basic Metabolic Panel: Recent Labs  Lab 04/29/18 1219 04/29/18 1427 04/30/18 0322 05/01/18 0432  NA 133*  --  137  --   K 3.9  --  4.2  --   CL 95*  --  100  --   CO2 28  --  29  --   GLUCOSE 173*  --  128*  --   BUN 13  --  11  --   CREATININE 0.58*  --  0.59*  --   CALCIUM 8.5*  --  8.3*  --   MG 1.4* 1.5*  --  1.6*  PHOS 2.9  --   --   --    GFR: Estimated Creatinine Clearance: 59.4 mL/min (A) (by C-G formula based on SCr of 0.59 mg/dL (L)). Recent Labs  Lab 04/29/18 1219 04/30/18 0322 05/01/18 0432  WBC 5.5 7.6 8.1    Liver Function Tests: Recent Labs  Lab 04/29/18 1219  AST 38  ALT 15  ALKPHOS 81  BILITOT 0.6  PROT 6.9  ALBUMIN 2.0*   No results for input(s): LIPASE, AMYLASE in the last 168 hours. No results for input(s): AMMONIA in the last 168 hours.  ABG    Component Value Date/Time   PHART 7.478 (H) 04/12/2018 1402   PCO2ART 36.0 04/12/2018 1402   PO2ART 76.8 (L) 04/12/2018 1402   HCO3 26.3 04/12/2018 1402   O2SAT 95.1 04/12/2018 1402     Coagulation Profile: Recent Labs  Lab 04/29/18 1219  INR 1.15    Cardiac Enzymes: No results for input(s): CKTOTAL, CKMB, CKMBINDEX, TROPONINI in the last 168 hours.  HbA1C: No results found for: HGBA1C  CBG: No results for input(s): GLUCAP in the last 168 hours.  Admitting History of Present Illness.   This is a 62 year old gentleman recently diagnosed via CT imaging with a large mediastinal paratracheal mass with associated hilar and mediastinal adenopathy.  The patient was originally seen in the hospital here at [facility] long approximately 2 weeks ago and discharged over the weekend with planned outpatient follow-up.  Patient follow-up bronchoscopy did not show.  We reached out to every number that was available in the chart and eventually  able to track down a friend that was willing to go check on him.  We even had [employer] his home to find him.  Ultimately the patient did show for his bronchoscopy today.  He is joined by his friend daily.  After speaking with [name] she states that the patient was so disheveled that she did not feel safe to return home.  She stated that she had to leave the door open because there was bugs and cockroaches and animals inside the home.  [name] said that she removed 8 of decaying trash from patient's bedroom.  After discussion with the patient in endoscopy and the fact that he is requiring 4 L of oxygen and moderate low blood pressures.  The decision was made to contact hospitalist for admission.  I have updated Norton Blizzard our thoracic oncology coordinator.  _________________________________________________________    Garner Nash, DO [facility] 05/01/2018 3:24  PM  Personal pager: [phone] If unanswered, please page CCM On-call: [phone]

## 2018-05-01 NOTE — Progress Notes (Signed)
OT Cancellation Note  Patient Details Name: [name] MRN: [id number] DOB: 08-Mar-1956   Cancelled Treatment:    Reason Eval/Treat Not Completed: Other (comment).  RN is expecting transport at any time for XRT. Will check back tomorrow.  Keron Koffman 05/01/2018, 1:08 PM  Lesle Chris, OTR/L Acute Rehabilitation Services [phone] [facility] pager [phone] office 05/01/2018

## 2018-05-01 NOTE — Care Management Note (Signed)
Case Management Note  Patient Details  Name: Warren Blankenship MRN: [id number] Date of Birth: 1955/12/10  Subjective/Objective:Admitted w/Malignant neoplasm hilus r lung. Hx: ARF, cachexia,smoking. From home alone. PT-recc SNF/24hr supv. CSW following for SNF.                    Action/Plan:dc SNF.   Expected Discharge Date:  (unknown)               Expected Discharge Plan:  Skilled Nursing Facility  In-House Referral:  Clinical Social Work  Discharge planning Services  CM Consult  Post Acute Care Choice:  Durable Medical Equipment(rw) Choice offered to:     DME Arranged:    DME Agency:     HH Arranged:    [place] Agency:     Status of Service:  In process, will continue to follow  If discussed at Long Length of Stay Meetings, dates discussed:    Additional Comments:  Dessa Phi, RN 05/01/2018, 10:09 AM

## 2018-05-01 NOTE — Consult Note (Signed)
[facility] [phone]   Fax:([phone]) [phone]  CONSULT NOTE  REFERRING PHYSICIAN: Dr. Leory Plowman Icard  REASON FOR CONSULTATION:  62 years old white male recently diagnosed with lung cancer.  HPI Warren Blankenship is a 62 y.o. male with long history of smoking and no other significant past medical history.  The patient mentioned that for several months he noticed some change in the nature of his smoking cough.  It was getting worse and he coughed some blood at some point.  He presented to the emergency department on 04/11/2018 and chest x-ray at that time showed hyperinflation with a symmetric right hilar fullness that could be due to enlarged hilar vessels and CT scan of the chest was recommended to exclude hilar mass.  CT scan of the chest performed on 04/15/2018 showed continuous right hilar soft tissue density likely combination of central mass and hilar adenopathy contiguous with the anterior paratracheal adenopathy, with soft tissue conglomerate measuring 8.1 x 4.0 x 6.6 cm.  This causes mass-effect on the right mainstem and right middle lobe bronchus.  There was additional right inferior hilar adenopathy with nodes measuring 1.5 cm in the infrahilar region.  There was 1.4 cm soft tissue density anterior to the right ventricle and may be epicardial adenopathy or loculated pericardial fluid.  The patient was seen by Dr. Valeta Harms and after several attempts of trying to reach the patient for bronchoscopy, the patient came to the hospital on 04/29/2018 and he underwent video bronchoscopy with endobronchial ultrasound by Dr. Valeta Harms.  It showed endobronchial tumor involving the anterior wall of the bronchus intermedius, approximately 1.5 cm from the main carina, extrinsic compression of the right mainstem and distal right lower lobe bronchus.  Biopsies from 4-hour/mediastinal mass as well as nodal station 11 L and 4L as well as bronchial washing of the right mainstem bronchus were  performed.  The final cytology ([id number]) showed malignant cells consistent with poorly differentiated carcinoma, favoring poorly differentiated adenocarcinoma.  The immunohistochemical stains showed the tumor cells were positive for cytokeratin 7 but negative for TTF-1, cytokeratin 5/6 and p63. Dr. Valeta Harms kindly asked me to see the patient for evaluation and recommendation regarding treatment of his condition. When seen today the patient is feeling a little bit better.  He continues to have shortness of breath at baseline increased with exertion but no significant chest pain.  He continues to have cough with no hemoptysis.  He has no nausea, vomiting, diarrhea or constipation.  He lost more than 25 pounds in the last few months.  He has no headache or visual changes.  He denied having any fever or chills.  He had MRI of the brain that was negative for malignancy. Family history significant for father with lung cancer.  Mother died from old age. The patient is single and has 5 children.  He has a long history for smoking as well as history of alcohol abuse and smoking marijuana.  HPI  Past Medical History:  Diagnosis Date  . Mass of right lung   . Tobacco abuse     Past Surgical History:  Procedure Laterality Date  . BIOPSY  04/29/2018   Procedure: BIOPSY;  Surgeon: Garner Nash, DO;  Location: [facility] END[facility];  Service: Cardiopulmonary;;  . BRONCHIAL WASHINGS  04/29/2018   Procedure: BRONCHIAL WASHINGS;  Surgeon: Garner Nash, DO;  Location: [facility];  Service: Cardiopulmonary;;  . ENDOBRONCHIAL ULTRASOUND Bilateral 04/29/2018   Procedure: ENDOBRONCHIAL ULTRASOUND;  Surgeon: June Leap  L, DO;  Location: [facility] ENDOSCOPY;  Service: Cardiopulmonary;  Laterality: Bilateral;  . Endobronchial Ultrasound (EBUS), Mediastinum  Bilateral 04/29/2018   Dr. Valeta Harms   . FINE NEEDLE ASPIRATION BIOPSY  04/29/2018   Procedure: FINE NEEDLE ASPIRATION BIOPSY;  Surgeon: Garner Nash, DO;  Location: [facility]  ENDOSCOPY;  Service: Cardiopulmonary;;  . FLEXIBLE BRONCHOSCOPY  04/29/2018   Procedure: FLEXIBLE BRONCHOSCOPY;  Surgeon: Garner Nash, DO;  Location: [facility];  Service: Cardiopulmonary;;  . TONSILLECTOMY      Family History  Problem Relation Age of Onset  . Lung disease Father     Social History Social History   Tobacco Use  . Smoking status: Current Every Day Smoker    Packs/day: 1.00  . Smokeless tobacco: Never Used  Substance Use Topics  . Alcohol use: Yes    Comment: Former   . Drug use: Not Currently    No Known Allergies  Current Facility-Administered Medications  Medication Dose Route Frequency Provider Last Rate Last Dose  . acetaminophen (TYLENOL) tablet 650 mg  650 mg Oral Q6H PRN Mercy Riding, MD       Or  . acetaminophen (TYLENOL) suppository 650 mg  650 mg Rectal Q6H PRN Gonfa, Taye T, MD      . ipratropium-albuterol (DUONEB) 0.5-2.5 (3) MG/3ML nebulizer solution 3 mL  3 mL Nebulization Q4H PRN Gonfa, Taye T, MD      . lactated ringers infusion   Intravenous Continuous Mercy Riding, MD   Stopped at 04/30/18 1022  . lactose free nutrition (BOOST PLUS) liquid 237 mL  237 mL Oral TID WC Donne Hazel, MD   237 mL at 05/01/18 1208  . [START ON 05/02/2018] nicotine (NICODERM CQ - dosed in mg/24 hr) patch 7 mg  7 mg Transdermal Daily Rizwan, Saima, MD      . ondansetron (ZOFRAN) tablet 4 mg  4 mg Oral Q6H PRN Gonfa, Taye T, MD       Or  . ondansetron (ZOFRAN) injection 4 mg  4 mg Intravenous Q6H PRN Gonfa, Taye T, MD      . oxyCODONE (Oxy IR/ROXICODONE) immediate release tablet 5 mg  5 mg Oral Q4H PRN Gonfa, Taye T, MD      . polyethylene glycol (MIRALAX / GLYCOLAX) packet 17 g  17 g Oral Daily PRN Wendee Beavers T, MD      . QUEtiapine (SEROQUEL) tablet 25 mg  25 mg Oral QHS Kipp Brood, MD   25 mg at 04/30/18 2115  . traZODone (DESYREL) tablet 25 mg  25 mg Oral QHS PRN Mercy Riding, MD        Review of Systems  Constitutional: positive for fatigue  and weight loss Eyes: negative Ears, nose, mouth, throat, and face: negative Respiratory: positive for cough and dyspnea on exertion Cardiovascular: negative Gastrointestinal: negative Genitourinary:negative Integument/breast: negative Hematologic/lymphatic: negative Musculoskeletal:negative Neurological: negative Behavioral/Psych: negative Endocrine: negative Allergic/Immunologic: negative  Physical Exam  ZOX:WRUEA, healthy, no distress, cachectic, ill looking and malnourished SKIN: skin color, texture, turgor are normal, no rashes or significant lesions HEAD: Normocephalic, No masses, lesions, tenderness or abnormalities EYES: normal, PERRLA, Conjunctiva are pink and non-injected EARS: External ears normal, Canals clear OROPHARYNX:no exudate and no erythema  NECK: supple, no adenopathy, no JVD LYMPH:  no palpable lymphadenopathy, no hepatosplenomegaly LUNGS: decreased breath sounds, prolonged expiratory phase HEART: regular rate & rhythm, no murmurs and no gallops ABDOMEN:abdomen soft, non-tender, normal bowel sounds and no masses or organomegaly BACK: No CVA tenderness,  Range of motion is normal EXTREMITIES:no joint deformities, effusion, or inflammation, no edema  NEURO: alert & oriented x 3 with fluent speech, no focal motor/sensory deficits  PERFORMANCE STATUS: ECOG 1  LABORATORY DATA: Lab Results  Component Value Date   WBC 8.1 05/01/2018   HGB 7.6 (L) 05/01/2018   HCT 24.6 (L) 05/01/2018   MCV 86.9 05/01/2018   PLT 373 05/01/2018    _0 @  RADIOGRAPHIC STUDIES: Dg Chest 2 View  Result Date: 04/11/2018 CLINICAL DATA:  Cough EXAM: CHEST - 2 VIEW COMPARISON:  03/19/2018 FINDINGS: Hyperinflated lungs. No focal airspace disease or effusion. Stable cardiomediastinal silhouette with aortic atherosclerosis. Enlarged right hilar vessels. No pneumothorax. IMPRESSION: Hyperinflation. Asymmetric right hilar fullness could be due to enlarged hilar vessels. Chest CT  could obtain to exclude hilar mass. No acute pulmonary infiltrate. Electronically Signed   By: Donavan Foil M.D.   On: 04/11/2018 23:10   Ct Chest W Contrast  Result Date: 04/12/2018 CLINICAL DATA:  Right hilar fullness on x-ray. Cough. Current smoker. EXAM: CT CHEST WITH CONTRAST TECHNIQUE: Multidetector CT imaging of the chest was performed during intravenous contrast administration. CONTRAST:  157m ISOVUE-300 IOPAMIDOL (ISOVUE-300) INJECTION 61% COMPARISON:  Radiograph earlier this day. FINDINGS: Cardiovascular: Aortic atherosclerosis without aneurysm. The heart is normal in size. There are coronary artery calcifications. 14 mm soft tissue density anterior to the right ventricle may be loculated pericardial fluid versus adenopathy. Mediastinum/Nodes: Contiguous right hilar soft tissue density likely combination of central mass and hilar adenopathy is contiguous with anterior paratracheal adenopathy, with soft tissue conglomerate measuring 8.1 x 4.0 x 6.6 cm. This causes mass effect on the right mainstem and right middle lobe bronchus. Additional right inferior hilar adenopathy with node measuring 15 mm in the infrahilar region. 14 mm soft tissue density anterior to the right ventricle may be epicardial adenopathy or loculated pericardial fluid. No left hilar adenopathy. No supraclavicular adenopathy. Thyroid gland is diminutive. The esophagus is nondistended. Lungs/Pleura: Right hilar soft tissue density likely combination of central pulmonary lesion and enlarged lymph nodes. Adjacent micronodularity extends into the right upper and right middle lobes with associated septal thickening. There tree in bud opacities extending into the right upper lobe. 4 mm right apical nodule image 39 series 7. Moderate emphysema and central bronchial thickening. Dependent opacity in the right lower lobe likely atelectasis. No pleural fluid. Upper Abdomen: No adrenal mass. 12 mm soft tissue nodule in the left upper quadrant  adjacent to the splenic flexure of the colon has similar density to the adjacent spleen and is likely a splenule but nonspecific. No focal hepatic mass. Musculoskeletal: No blastic or destructive lytic osseous lesions. Remote left lateral rib fractures. Prominent Schmorl's node in the superior endplate of T7. paucity of subcutaneous fat suggesting cachexia. IMPRESSION: 1. Abnormal right hilar soft tissue density likely combination of central lung mass and adenopathy, appears contiguous with anterior paratracheal adenopathy. This conglomerate soft tissue measures 8.1 x 4.0 x 6.6 cm, unable to differentiate primary malignancy from adenopathy. There is adjacent septal thickening and micronodularity which may be lymphangitic spread of tumor. This causes mass effect on the right mainstem and right middle lobe bronchi. 2. Soft tissue density anterior to the right ventricle may be loculated pericardial fluid or epicardial adenopathy. 3. Tree in bud opacity in the right upper and to lesser extent right middle lobe may be infectious, inflammatory, postobstructive or malignant. Nonspecific 4 mm right apical pulmonary nodule. 4. Moderate emphysema and central bronchial thickening. Aortic Atherosclerosis (ICD10-I70.0) and Emphysema (  ICD10-J43.9). Electronically Signed   By: Keith Rake M.D.   On: 04/12/2018 01:18   Mr Jeri Cos YI Contrast  Result Date: 04/12/2018 CLINICAL DATA:  Small-cell lung cancer.  Staging. EXAM: MRI HEAD WITHOUT AND WITH CONTRAST TECHNIQUE: Multiplanar, multiecho pulse sequences of the brain and surrounding structures were obtained without and with intravenous contrast. CONTRAST:  4.5 cc Gadavist COMPARISON:  None. FINDINGS: Sequences vary from mild to moderately motion degraded. INTRACRANIAL CONTENTS: No reduced diffusion to suggest acute ischemia or hypercellular tumor. No susceptibility artifact to suggest hemorrhage. Moderate ventriculomegaly in the base of global parenchymal brain volume  loss. Patchy supratentorial white matter FLAIR T2 hyperintensities. No suspicious parenchymal signal, masses, mass effect. No abnormal intraparenchymal or extra-axial enhancement. No abnormal extra-axial fluid collections. No extra-axial masses. VASCULAR: Normal major intracranial vascular flow voids present at skull base. SKULL AND UPPER CERVICAL SPINE: No abnormal sellar expansion. No suspicious calvarial bone marrow signal. Craniocervical junction maintained. SINUSES/ORBITS: RIGHT mastoid effusion. Mild paranasal sinus mucosal thickening without air-fluid levels. Included ocular globes and orbital contents are non-suspicious. OTHER: Patient is edentulous. IMPRESSION: 1. Motion degraded examination.  No intracranial metastasis. 2. Moderate parenchymal brain volume loss, advanced for age. 3. Mild chronic small vessel ischemic changes. Electronically Signed   By: Elon Alas M.D.   On: 04/12/2018 22:06    ASSESSMENT: This is a very pleasant 62 years old white male recently diagnosed with stage IIIb (T4, N2, M0)) non-small cell lung cancer, poorly differentiated carcinoma favoring adenocarcinoma presented with large right hilar and upper lobe lung mass with right hilar and mediastinal lymphadenopathy and compression of right mainstem bronchus as well as right middle lobe bronchus diagnosed in September 2019.   PLAN: I had a lengthy discussion with the patient today about his current disease stage, prognosis and treatment options. I recommended for the patient to complete the staging work-up by ordering a PET scan which will be done on outpatient basis after discharge from the hospital. His MRI of the brain showed no evidence for metastatic disease to the brain. I asked the pathology department to send his tissue block if sufficient for PDL 1 testing but I was told it will not be enough for molecular studies.  I will arrange for the patient to have blood test sent for molecular studies either by  [employer] or other testing company. The patient has a lot of social issues and it is unsafe for him to go back home at this point.  He is in the process of being discharged in the next day or 2 to a skilled nursing facility. He is currently undergoing radiotherapy to the large right lung mass and he will be treated with a course of concurrent chemoradiation with weekly carboplatin and paclitaxel if he has no evidence of metastatic disease outside the lung. I will arrange for the patient a follow-up appointment with me at the cancer center within 1 week after discharge for more detailed discussion of his chemotherapy option. He was advised to call immediately if he has any concerning symptoms in the interval. Smoking cessation, I strongly encouraged the patient to quit smoking.  The patient voices understanding of current disease status and treatment options and is in agreement with the current care plan.  All questions were answered. The patient knows to call the clinic with any problems, questions or concerns. We can certainly see the patient much sooner if necessary.  Thank you so much for allowing me to participate in the care of [place]  [place] L[place]. I will continue to follow up the patient with you and assist in his care.  Disclaimer: This note was dictated with voice recognition software. Similar sounding words can inadvertently be transcribed and may not be corrected upon review.   Eilleen Kempf May 01, 2018, 3:14 PM

## 2018-05-01 NOTE — Progress Notes (Signed)
PROGRESS NOTE    Warren Blankenship   WLN:[id number]  DOB: 1956/02/16  DOA: 04/29/2018 PCP: Patient, No Pcp Per   Brief Narrative:  Warren Blankenship is 62 y.o.malewith medical history significant forlung masswith hilar andmediastinal adenopathy,  hyponatremia, 52-pack-year smoking history, recent 20 lb weight loss andpoor social condition who was broughtin by the police. Due to lung mass noted on previous imaging and not being able to follow up, he was admitted forendobronchial ultrasound and biopsies.   On admission>> hypotensive to 82/49, pulse ox lower 90s on 5 L by nasal cannula. Underwent EBUS and biopsies on 9/30 by Dr Valeta Harms. Endoscopy finding> large right sided paratracheal mass and right hilar mass. Due to concerns for SVC syndrome, radiation oncology consulted for urgent radiotherapy.  Subjective: He has no complaints- eating breakfast ROS: no complaints of nausea, vomiting, constipation diarrhea, cough, dyspnea or dysuria. No other complaints.   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   Mediastinal adenopathy - s/p bronch and biopsy which shows poorly differentiated carcinoma - XRT to be started- med onc aware of the results  Active Problems: Anemia of Chronic disease - anemia panel shows normal Ferretin, Folate and B12 levels    Tobacco use disorder - smoked about 1/3 of ppd- counseled to quit- cont Nicotine patch    Severe protein-calorie malnutrition Warren Blankenship: less than 60% of standard weight) - continue dietary supplements    Hyponatremia - improved due to saline infusion - as he is eating and drinking well, stop IVF today    Hypomagnesemia - replacing  Underweight/ severe protein calorie malnutrition - weight loss of 20 lbs recently despite having a normal appetite - Body mass index is 15.54 kg/m - cont dietary supplements   DVT prophylaxis: SCDs Code Status: full code Family  Communication:  Disposition Plan: SNF Consultants:   PCCM  Rad onc  Med onc Procedures:   bronchoscopy  Antimicrobials:  Anti-infectives (From admission, onward)   None       Objective: Vitals:   04/30/18 2000 04/30/18 2100 04/30/18 2200 05/01/18 0211  BP: (!) 87/46 (!) 93/52 (!) 95/54   Pulse: 86 96 (!) 104 83  Resp: (!) 24 14 (!) 29 16  Temp: 98 F (36.7 C)     TempSrc: Oral     SpO2: 97% 98% 99%   Weight:      Height:        Intake/Output Summary (Last 24 hours) at 05/01/2018 0855 Last data filed at 05/01/2018 0700 Gross per 24 hour  Intake 762.64 ml  Output 1200 ml  Net -437.36 ml   Filed Weights   04/29/18 0640 04/29/18 1200  Weight: 44.6 kg 45 kg    Examination: General exam: Appears comfortable  HEENT: PERRLA, oral mucosa moist, no sclera icterus or thrush Respiratory system: Clear to auscultation. Respiratory effort normal. Cardiovascular system: S1 & S2 heard, RRR.   Gastrointestinal system: Abdomen soft, non-tender, nondistended. Normal bowel sound. No organomegaly Central nervous system: Alert and oriented. No focal neurological deficits. Extremities: No cyanosis, clubbing or edema Skin: No rashes or ulcers Psychiatry:  Mood & affect appropriate.     Data Reviewed: I have personally reviewed following labs and imaging studies  CBC: Recent Labs  Lab 04/29/18 1219 04/30/18 0322 05/01/18 0432  WBC 5.5 7.6 8.1  NEUTROABS 4.8  --   --   HGB 8.9* 7.9* 7.6*  HCT 28.8* 25.7* 24.6*  MCV 87.3  86.2 86.9  PLT 380 394 734   Basic Metabolic Panel: Recent Labs  Lab 04/29/18 1219 04/29/18 1427 04/30/18 0322 05/01/18 0432  NA 133*  --  137  --   K 3.9  --  4.2  --   CL 95*  --  100  --   CO2 28  --  29  --   GLUCOSE 173*  --  128*  --   BUN 13  --  11  --   CREATININE 0.58*  --  0.59*  --   CALCIUM 8.5*  --  8.3*  --   MG 1.4* 1.5*  --  1.6*  PHOS 2.9  --   --   --    GFR: Estimated Creatinine Clearance: 59.4 mL/min (A) (by C-G  formula based on SCr of 0.59 mg/dL (L)). Liver Function Tests: Recent Labs  Lab 04/29/18 1219  AST 38  ALT 15  ALKPHOS 81  BILITOT 0.6  PROT 6.9  ALBUMIN 2.0*   No results for input(s): LIPASE, AMYLASE in the last 168 hours. No results for input(s): AMMONIA in the last 168 hours. Coagulation Profile: Recent Labs  Lab 04/29/18 1219  INR 1.15   Cardiac Enzymes: No results for input(s): CKTOTAL, CKMB, CKMBINDEX, TROPONINI in the last 168 hours. BNP (last 3 results) No results for input(s): PROBNP in the last 8760 hours. HbA1C: No results for input(s): HGBA1C in the last 72 hours. CBG: No results for input(s): GLUCAP in the last 168 hours. Lipid Profile: No results for input(s): CHOL, HDL, LDLCALC, TRIG, CHOLHDL, LDLDIRECT in the last 72 hours. Thyroid Function Tests: No results for input(s): TSH, T4TOTAL, FREET4, T3FREE, THYROIDAB in the last 72 hours. Anemia Panel: Recent Labs    04/29/18 1219  VITAMINB12 748  FOLATE 8.2  FERRITIN 356*  TIBC 128*  IRON 23*  RETICCTPCT 0.8   Urine analysis: No results found for: COLORURINE, APPEARANCEUR, LABSPEC, PHURINE, GLUCOSEU, HGBUR, BILIRUBINUR, KETONESUR, PROTEINUR, UROBILINOGEN, NITRITE, LEUKOCYTESUR Sepsis Labs: @LABRCNTIP (procalcitonin:4,lacticidven:4) ) Recent Results (from the past 240 hour(s))  MRSA PCR Screening     Status: None   Collection Time: 04/29/18 11:52 AM  Result Value Ref Range Status   MRSA by PCR NEGATIVE NEGATIVE Final    Comment:        The GeneXpert MRSA Assay (FDA approved for NASAL specimens only), is one component of a comprehensive MRSA colonization surveillance program. It is not intended to diagnose MRSA infection nor to guide or monitor treatment for MRSA infections. Performed at [facility], [place] [address], [place], [place] [address]          Radiology Studies: No results found.    Scheduled Meds: . lactose free nutrition  237 mL Oral TID WC  .  nicotine  21 mg Transdermal Daily  . QUEtiapine  25 mg Oral QHS   Continuous Infusions: . sodium chloride 50 mL/hr at 04/30/18 2252  . lactated ringers Stopped (04/30/18 1022)  . magnesium sulfate 1 - 4 g bolus IVPB       LOS: 2 days    Time spent in minutes: 35    Debbe Odea, MD [facility] Pager: [email or web address] Password [email or web address] 05/01/2018, 8:55 AM

## 2018-05-02 ENCOUNTER — Ambulatory Visit
Admit: 2018-05-02 | Discharge: 2018-05-02 | Disposition: A | Discharge status: Home / Self Care | Payer: Self-pay | Attending: Radiation Oncology | Admitting: Radiation Oncology

## 2018-05-02 LAB — CBC
HEMATOCRIT: 26.1 % — AB (ref 39.0–52.0)
HEMOGLOBIN: 8.1 g/dL — AB (ref 13.0–17.0)
MCH: 26.9 pg (ref 26.0–34.0)
MCHC: 31 g/dL (ref 30.0–36.0)
MCV: 86.7 fL (ref 78.0–100.0)
Platelets: 345 10*3/uL (ref 150–400)
RBC: 3.01 MIL/uL — AB (ref 4.22–5.81)
RDW: 17 % — ABNORMAL HIGH (ref 11.5–15.5)
WBC: 6.9 10*3/uL (ref 4.0–10.5)

## 2018-05-02 LAB — MAGNESIUM: MAGNESIUM: 1.6 mg/dL — AB (ref 1.7–2.4)

## 2018-05-02 LAB — BASIC METABOLIC PANEL
ANION GAP: 8 (ref 5–15)
BUN: 8 mg/dL (ref 8–23)
CHLORIDE: 98 mmol/L (ref 98–111)
CO2: 27 mmol/L (ref 22–32)
Calcium: 8.1 mg/dL — ABNORMAL LOW (ref 8.9–10.3)
Creatinine, Ser: 0.51 mg/dL — ABNORMAL LOW (ref 0.61–1.24)
GFR calc non Af Amer: >60 mL/min (ref >60)
GLUCOSE: 94 mg/dL (ref 70–99)
POTASSIUM: 3.9 mmol/L (ref 3.5–5.1)
Sodium: 133 mmol/L — ABNORMAL LOW (ref 135–145)

## 2018-05-02 MED ORDER — MAGNESIUM OXIDE 400 (241.3 MG) MG PO TABS: 400.0000 mg | ORAL_TABLET | Freq: Every day | ORAL | Status: DC

## 2018-05-02 MED ORDER — NICOTINE 7 MG/24HR TD PT24: 7.0000 mg | MEDICATED_PATCH | Freq: Every day | TRANSDERMAL | 0 refills | Status: DC

## 2018-05-02 MED ORDER — TRAZODONE HCL 50 MG PO TABS: 25.0000 mg | ORAL_TABLET | Freq: Every evening | ORAL | Status: DC | PRN

## 2018-05-02 MED ORDER — BOOST PLUS PO LIQD: 237.0000 mL | Freq: Three times a day (TID) | ORAL | 0 refills | Status: DC

## 2018-05-02 MED ORDER — POLYETHYLENE GLYCOL 3350 17 G PO PACK: 17.0000 g | PACK | Freq: Every day | ORAL | 0 refills | Status: DC | PRN

## 2018-05-02 MED ORDER — ACETAMINOPHEN 325 MG PO TABS: 650.0000 mg | ORAL_TABLET | Freq: Four times a day (QID) | ORAL | Status: DC | PRN

## 2018-05-02 NOTE — Progress Notes (Signed)
OT Cancellation Note  Patient Details Name: Warren Blankenship MRN: [id number] DOB: [birth year]   Cancelled Treatment:    Reason Eval/Treat Not Completed: Other (comment). Pt has been up a couple of times to bathroom and doesn't feel like doing anything else right now. I do not anticipate I'll be able to return later today.  Khristian Phillippi 05/02/2018, 7:38 AM  Lesle Chris, OTR/L Acute Rehabilitation Services [phone] [facility] pager [phone] office 05/02/2018

## 2018-05-02 NOTE — Discharge Summary (Addendum)
Physician Discharge Summary  Warren Blankenship FMB:[id number] DOB: Aug 31, 1955 DOA: 04/29/2018  PCP: Patient, No Pcp Per  Admit date: 04/29/2018 Discharge date: 05/03/2018  Admitted From: home Disposition:  home  Recommendations for Outpatient Follow-up:  1. Check Mg level in 3-4 days  Discharge Condition:  stable   CODE STATUS:  Full code   Diet recommendation:  Regular diet Consultations:  PCCM  Medical oncology  Radiation oncology    Discharge Diagnoses:  Principal Problem:   Malignant neoplasm of hilus of right lung ([place]) Active Problems:   Tobacco use disorder   Mediastinal adenopathy   Hilar adenopathy   Severe protein-calorie malnutrition [name]: less than 60% of standard weight) (HCC)   Cachexia (HCC)   Hypotension   Hyponatremia   Hypokalemia   Anemia of chronic disease   Brief Summary: Warren Blankenship is 62 y.o.malewith medical history significant forlung masswith hilar andmediastinal adenopathy,  hyponatremia, 52-pack-year smoking history, recent 20 lb weight loss andpoor social condition who was broughtin by the police. Due to lung mass noted on previous imaging and not being able to follow up, he was admitted forendobronchial ultrasound and biopsies.   On admission>> hypotensive to 82/49, pulse ox lower 90s on 5 L by nasal cannula. Underwent EBUS and biopsies on 9/30 by Dr Valeta Harms. Endoscopy finding> large right sided paratracheal mass and right hilar mass. Due to concerns for SVC syndrome, radiation oncology consulted for urgent radiotherapy.  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   Mediastinal adenopathy - s/p bronch and biopsy which shows poorly differentiated carcinoma - XRT started on 10/2 - med onc aware of the results and Dr Julien Nordmann has evaluated him- he plans on an outpt PET scan and follow up for discussion of treatment  Active Problems: Anemia of Chronic disease - anemia panel  shows normal Ferretin, Folate and B12 levels    Tobacco use disorder - smoked about 1/3 of ppd- counseled to quit- cont Nicotine patch    Severe protein-calorie malnutrition [name]: less than 60% of standard weight) - continue dietary supplements    Hyponatremia and hypotension - BP 80/51, sodium 132 on admission - likely due to dehydration- sodium improved due to saline infusion- BP improved only slightly and SBP is now in the 90s - suspect this is his baseline - as he is eating and drinking well, stopped IVF on 10/2    Hypomagnesemia - replacing- will need continued oral replacement and f/u of levels as SNF  Underweight/ severe protein calorie malnutrition - weight loss of 20 lbs recently despite having a normal appetite - Body mass index is 15.54 kg/m - cont dietary supplements  Hypotension - baseline SBP has been about 80s-90s   Discharge Exam: Vitals:   04/30/18 2200 05/01/18 0211  BP: (!) 95/54   Pulse: (!) 104 83  Resp: (!) 29 16  Temp:    SpO2: 99%    Vitals:   04/30/18 2000 04/30/18 2100 04/30/18 2200 05/01/18 0211  BP: (!) 87/46 (!) 93/52 (!) 95/54   Pulse: 86 96 (!) 104 83  Resp: (!) 24 14 (!) 29 16  Temp: 98 F (36.7 C)     TempSrc: Oral     SpO2: 97% 98% 99%   Weight:      Height:        General: Pt is alert, awake, not in acute distress- cachectic  Cardiovascular: RRR, S1/S2 +, no rubs, no gallops Respiratory: CTA bilaterally,  no wheezing, no rhonchi Abdominal: Soft, NT, ND, bowel sounds + Extremities: no edema, no cyanosis   Discharge Instructions  Discharge Instructions    Diet general   Complete by:  As directed    Regular diet   Increase activity slowly   Complete by:  As directed    Increase activity slowly   Complete by:  As directed      Allergies as of 05/03/2018   No Known Allergies     Medication List    STOP taking these medications   naproxen sodium 220 MG tablet Commonly known as:  ALEVE   nicotine 21 mg/24hr  patch Commonly known as:  NICODERM CQ - dosed in mg/24 hours Replaced by:  nicotine 7 mg/24hr patch     TAKE these medications   acetaminophen 325 MG tablet Commonly known as:  TYLENOL Take 2 tablets (650 mg total) by mouth every 6 (six) hours as needed for mild pain (or Fever >/= 101).   lactose free nutrition Liqd Take 237 mLs by mouth 3 (three) times daily between meals.   magnesium oxide 400 (241.3 Mg) MG tablet Commonly known as:  MAG-OX Take 1 tablet (400 mg total) by mouth daily.   multivitamin with minerals Tabs tablet Take 1 tablet by mouth daily.   nicotine 7 mg/24hr patch Commonly known as:  NICODERM CQ - dosed in mg/24 hr Place 1 patch (7 mg total) onto the skin daily. Replaces:  nicotine 21 mg/24hr patch   polyethylene glycol packet Commonly known as:  MIRALAX / GLYCOLAX Take 17 g by mouth daily as needed for mild constipation.      Follow-up Information    Icard, Leory Plowman L, DO Follow up on 06/04/2018.   Specialty:  Pulmonary Disease Why:  at 930am Contact information: [place] [address] [phone]          No Known Allergies   Procedures/Studies: Bronchoscopy and biopsies  Dg Chest 2 View  Result Date: 04/11/2018 CLINICAL DATA:  Cough EXAM: CHEST - 2 VIEW COMPARISON:  03/19/2018 FINDINGS: Hyperinflated lungs. No focal airspace disease or effusion. Stable cardiomediastinal silhouette with aortic atherosclerosis. Enlarged right hilar vessels. No pneumothorax. IMPRESSION: Hyperinflation. Asymmetric right hilar fullness could be due to enlarged hilar vessels. Chest CT could obtain to exclude hilar mass. No acute pulmonary infiltrate. Electronically Signed   By: Donavan Foil M.D.   On: 04/11/2018 23:10   Ct Chest W Contrast  Result Date: 04/12/2018 CLINICAL DATA:  Right hilar fullness on x-ray. Cough. Current smoker. EXAM: CT CHEST WITH CONTRAST TECHNIQUE: Multidetector CT imaging of the chest was performed during intravenous  contrast administration. CONTRAST:  172mL ISOVUE-300 IOPAMIDOL (ISOVUE-300) INJECTION 61% COMPARISON:  Radiograph earlier this day. FINDINGS: Cardiovascular: Aortic atherosclerosis without aneurysm. The heart is normal in size. There are coronary artery calcifications. 14 mm soft tissue density anterior to the right ventricle may be loculated pericardial fluid versus adenopathy. Mediastinum/Nodes: Contiguous right hilar soft tissue density likely combination of central mass and hilar adenopathy is contiguous with anterior paratracheal adenopathy, with soft tissue conglomerate measuring 8.1 x 4.0 x 6.6 cm. This causes mass effect on the right mainstem and right middle lobe bronchus. Additional right inferior hilar adenopathy with node measuring 15 mm in the infrahilar region. 14 mm soft tissue density anterior to the right ventricle may be epicardial adenopathy or loculated pericardial fluid. No left hilar adenopathy. No supraclavicular adenopathy. Thyroid gland is diminutive. The esophagus is nondistended. Lungs/Pleura: Right hilar soft tissue density  likely combination of central pulmonary lesion and enlarged lymph nodes. Adjacent micronodularity extends into the right upper and right middle lobes with associated septal thickening. There tree in bud opacities extending into the right upper lobe. 4 mm right apical nodule image 39 series 7. Moderate emphysema and central bronchial thickening. Dependent opacity in the right lower lobe likely atelectasis. No pleural fluid. Upper Abdomen: No adrenal mass. 12 mm soft tissue nodule in the left upper quadrant adjacent to the splenic flexure of the colon has similar density to the adjacent spleen and is likely a splenule but nonspecific. No focal hepatic mass. Musculoskeletal: No blastic or destructive lytic osseous lesions. Remote left lateral rib fractures. Prominent Schmorl's node in the superior endplate of T7. paucity of subcutaneous fat suggesting cachexia.  IMPRESSION: 1. Abnormal right hilar soft tissue density likely combination of central lung mass and adenopathy, appears contiguous with anterior paratracheal adenopathy. This conglomerate soft tissue measures 8.1 x 4.0 x 6.6 cm, unable to differentiate primary malignancy from adenopathy. There is adjacent septal thickening and micronodularity which may be lymphangitic spread of tumor. This causes mass effect on the right mainstem and right middle lobe bronchi. 2. Soft tissue density anterior to the right ventricle may be loculated pericardial fluid or epicardial adenopathy. 3. Tree in bud opacity in the right upper and to lesser extent right middle lobe may be infectious, inflammatory, postobstructive or malignant. Nonspecific 4 mm right apical pulmonary nodule. 4. Moderate emphysema and central bronchial thickening. Aortic Atherosclerosis (ICD10-I70.0) and Emphysema (ICD10-J43.9). Electronically Signed   By: Keith Rake M.D.   On: 04/12/2018 01:18   Mr Jeri Cos ZH Contrast  Result Date: 04/12/2018 CLINICAL DATA:  Small-cell lung cancer.  Staging. EXAM: MRI HEAD WITHOUT AND WITH CONTRAST TECHNIQUE: Multiplanar, multiecho pulse sequences of the brain and surrounding structures were obtained without and with intravenous contrast. CONTRAST:  4.5 cc Gadavist COMPARISON:  None. FINDINGS: Sequences vary from mild to moderately motion degraded. INTRACRANIAL CONTENTS: No reduced diffusion to suggest acute ischemia or hypercellular tumor. No susceptibility artifact to suggest hemorrhage. Moderate ventriculomegaly in the base of global parenchymal brain volume loss. Patchy supratentorial white matter FLAIR T2 hyperintensities. No suspicious parenchymal signal, masses, mass effect. No abnormal intraparenchymal or extra-axial enhancement. No abnormal extra-axial fluid collections. No extra-axial masses. VASCULAR: Normal major intracranial vascular flow voids present at skull base. SKULL AND UPPER CERVICAL SPINE: No  abnormal sellar expansion. No suspicious calvarial bone marrow signal. Craniocervical junction maintained. SINUSES/ORBITS: RIGHT mastoid effusion. Mild paranasal sinus mucosal thickening without air-fluid levels. Included ocular globes and orbital contents are non-suspicious. OTHER: Patient is edentulous. IMPRESSION: 1. Motion degraded examination.  No intracranial metastasis. 2. Moderate parenchymal brain volume loss, advanced for age. 3. Mild chronic small vessel ischemic changes. Electronically Signed   By: Elon Alas M.D.   On: 04/12/2018 22:06     The results of significant diagnostics from this hospitalization (including imaging, microbiology, ancillary and laboratory) are listed below for reference.     Microbiology: Recent Results (from the past 240 hour(s))  MRSA PCR Screening     Status: None   Collection Time: 04/29/18 11:52 AM  Result Value Ref Range Status   MRSA by PCR NEGATIVE NEGATIVE Final    Comment:        The GeneXpert MRSA Assay (FDA approved for NASAL specimens only), is one component of a comprehensive MRSA colonization surveillance program. It is not intended to diagnose MRSA infection nor to guide or monitor treatment for MRSA infections. Performed  at [facility], [place] [address], [place], [place] [address]      Labs: BNP (last 3 results) No results for input(s): BNP in the last 8760 hours. Basic Metabolic Panel: Recent Labs  Lab 04/29/18 1219 04/29/18 1427 04/30/18 0322 05/01/18 0432 05/02/18 0509  NA 133*  --  137  --  133*  K 3.9  --  4.2  --  3.9  CL 95*  --  100  --  98  CO2 28  --  29  --  27  GLUCOSE 173*  --  128*  --  94  BUN 13  --  11  --  8  CREATININE 0.58*  --  0.59*  --  0.51*  CALCIUM 8.5*  --  8.3*  --  8.1*  MG 1.4* 1.5*  --  1.6* 1.6*  PHOS 2.9  --   --   --   --    Liver Function Tests: Recent Labs  Lab 04/29/18 1219  AST 38  ALT 15  ALKPHOS 81  BILITOT 0.6  PROT 6.9  ALBUMIN 2.0*   No  results for input(s): LIPASE, AMYLASE in the last 168 hours. No results for input(s): AMMONIA in the last 168 hours. CBC: Recent Labs  Lab 04/29/18 1219 04/30/18 0322 05/01/18 0432 05/02/18 0509  WBC 5.5 7.6 8.1 6.9  NEUTROABS 4.8  --   --   --   HGB 8.9* 7.9* 7.6* 8.1*  HCT 28.8* 25.7* 24.6* 26.1*  MCV 87.3 86.2 86.9 86.7  PLT 380 394 373 345   Cardiac Enzymes: No results for input(s): CKTOTAL, CKMB, CKMBINDEX, TROPONINI in the last 168 hours. BNP: Invalid input(s): POCBNP CBG: No results for input(s): GLUCAP in the last 168 hours. D-Dimer No results for input(s): DDIMER in the last 72 hours. Hgb A1c No results for input(s): HGBA1C in the last 72 hours. Lipid Profile No results for input(s): CHOL, HDL, LDLCALC, TRIG, CHOLHDL, LDLDIRECT in the last 72 hours. Thyroid function studies No results for input(s): TSH, T4TOTAL, T3FREE, THYROIDAB in the last 72 hours.  Invalid input(s): FREET3 Anemia work up No results for input(s): VITAMINB12, FOLATE, FERRITIN, TIBC, IRON, RETICCTPCT in the last 72 hours. Urinalysis No results found for: COLORURINE, APPEARANCEUR, [place], [place], [place], [place], [place], [place], PROTEINUR, UROBILINOGEN, NITRITE, LEUKOCYTESUR Sepsis Labs Invalid input(s): PROCALCITONIN,  WBC,  LACTICIDVEN Microbiology Recent Results (from the past 240 hour(s))  MRSA PCR Screening     Status: None   Collection Time: 04/29/18 11:52 AM  Result Value Ref Range Status   MRSA by PCR NEGATIVE NEGATIVE Final    Comment:        The GeneXpert MRSA Assay (FDA approved for NASAL specimens only), is one component of a comprehensive MRSA colonization surveillance program. It is not intended to diagnose MRSA infection nor to guide or monitor treatment for MRSA infections. Performed at [facility], [place] [address] [place], [place] [address]      Time coordinating discharge in minutes: 74  SIGNED:   Debbe Odea, MD  [facility] 05/03/2018, 1:19 PM Pager   If 7PM-7AM, please contact night-coverage [email or web address] Password [email or web address]

## 2018-05-02 NOTE — Clinical Social Work Note (Signed)
Clinical Social Work Assessment  Patient Details  Name: Warren Blankenship MRN: [id number] Date of Birth: 1956/07/01  Date of referral:  05/02/18               Reason for consult:  Facility Placement, Discharge Planning                Permission sought to share information with:  Family Supports, Customer service manager Permission granted to share information::  Yes, Verbal Permission Granted  Name::     [name]  Agency::     Relationship::  friend  Contact Information:  [phone]  Housing/Transportation Living arrangements for the past 2 months:  Apartment Source of Information:  Patient Patient Interpreter Needed:  None Criminal Activity/Legal Involvement Pertinent to Current Situation/Hospitalization:  No - Comment as needed Significant Relationships:  Friend Lives with:  Self Do you feel safe going back to the place where you live?  Yes(PT recommending SNF;24 hour supervision) Need for family participation in patient care:  No (Coment)  Care giving concerns:  Patient from home alone. Patient reported that he is independent with ADLs at baseline and recently bought a walker. PT recommending SNF/24 hour supervision. CSW consulted for poor living situation, patient denied poor living situation. Patient's RN reported that patient is currently ambulating using a walker and stand by assist.    Facilities manager / plan:  CSW spoke with patient at bedside regarding discharge planning, PT recommendation for SNF and consult for poor living situation. Patient reported that he has been living in his apartment for 3-4 years and denied a poor living situation. CSW explained that patient's friend reported concerns about patient's living situation. Patient reported that he does not have bugs nor animals in his home. Patient reported that he still drives and completes ADLs independently. CSW inquired about patient's lack of insurance, patient reported that he was on the verge of getting  insurance when he started getting sick. 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 Patient reported that he currently receives 1730 or 1740 monthly from his retirement, patient denied having any savings. Patient reported that the only property he owns is 2 cars. CSW inquired if patient would be agreeable to SNF for ST rehab, patient reported that he would be agreeable. CSW explained SNF placement process, patient verbalized understanding. Patient granted CSW verbal permission to speak with his friend [name] if needed.   CSW contacted financial counseling to follow up regarding patient's potential to obtain medicaid, no answer. CSW left voicemail requesting return phone call.   CSW will continue to follow and assist with discharge planning.   Employment status:  Retired Forensic scientist:  Self Pay (Medicaid Pending) PT Recommendations:  [place], [place] / Referral to community resources:  [place]  Patient/Family's Response to care:  Patient appreciative of CSW assistance with discharge planning.  Patient/Family's Understanding of and Emotional Response to Diagnosis, Current Treatment, and Prognosis:  Patient presented calm and verbalized understanding of PT recommendation for SNF. Patient verbalized some understanding of current treatment and reported that he is waiting to hear what is going on with him and then he plans to tell his children. Patient informed CSW that he has 5 children and gave brief background on their relationship. Patient denied poor living situation and reported that he gets $1730/1740 monthly from retirement and is able to meet all his needs. Patient reported that  he drives himself and that he manages all his finances. Patient agreeable to SNF for ST rehab.  Emotional Assessment Appearance:  Appears stated age Attitude/Demeanor/Rapport:   Other(Cooperative) Affect (typically observed):  Calm, Appropriate Orientation:  Oriented to Self, Oriented to Place, Oriented to  Time, Oriented to Situation Alcohol / Substance use:  Not Applicable Psych involvement (Current and /or in the community):  No (Comment)  Discharge Needs  Concerns to be addressed:  Care Coordination Readmission within the last 30 days:  Yes Current discharge risk:  Lives alone, Physical Impairment Barriers to Discharge:  No SNF bed, Inadequate or no insurance   Burnis Medin, LCSW 05/02/2018, 3:14 PM

## 2018-05-03 ENCOUNTER — Ambulatory Visit
Admit: 2018-05-03 | Discharge: 2018-05-03 | Disposition: A | Discharge status: Home / Self Care | Payer: Self-pay | Attending: Radiation Oncology | Admitting: Radiation Oncology

## 2018-05-03 MED ORDER — POLYETHYLENE GLYCOL 3350 17 G PO PACK: 17.0000 g | PACK | Freq: Every day | ORAL | 0 refills | Status: AC | PRN

## 2018-05-03 MED ORDER — BOOST PLUS PO LIQD: 237.0000 mL | Freq: Three times a day (TID) | ORAL | 0 refills | Status: DC

## 2018-05-03 NOTE — Progress Notes (Signed)
[facility]  The patient was discharged yesterday but disposition was not fully determined as we were not sure whether he would go to SNF. I have examined him today and he remains stable for d/c. He will be going home with home health PT. He has a friend named [name] who will be keeping an eye on him as well and ensuring he is making it to his appointments.   Debbe Odea, MD

## 2018-05-03 NOTE — Progress Notes (Signed)
CSW met with patient and friend "Warren Blankenship" at bedside to discuss discharge planning. An earlier PT consult recommended the patient for SNF, but updated PT recommendations for the patient recommend discharging home with home health.  CSW inquired if patient would be interested in going to SNF and paying privately, patient is not interested and states he feels comfortable discharging home. CSW aware patient is pending Medicaid, and has been made aware that the patient will likely not qualify due to his income and assets. CSW briefly reviewed this with patient, CSW explained patient may be able to reapply for Med[employer] if denied and to contact [employer] to start the process.  Patient agreeable to plan to discharge home; friend Warren Blankenship is concerned for patient attending appointments and driving himself but patient reports he should be able to coordinate transportation through taxis and friends and family. Patient and friend have no further questions for CSW.  CSW contacted attending physician, patient expected to discharge home today.   No other social work needs identified. CSW signing off, please reconsult if additional needs arise.  Stephanie Acre, [place] Social Worker [phone]

## 2018-05-03 NOTE — Care Management Note (Signed)
Case Management Note  Patient Details  Name: BEHR CISLO MRN: [id number] Date of Birth: [birth year]  Subjective/Objective: PT recc HHC. Patient already has rw. Spoke to patient in rm about HHC-patient pleasantly declines-states he can't afford it, has no pcp-provided w/pcp listing-encouraged Patient [place] voiced understanding. Has own transp home. MD updated. No further CM needs.                   Action/Plan:dc home.   Expected Discharge Date:  05/03/18               Expected Discharge Plan:  [place]  In-House Referral:  Clinical Social Work  Discharge planning Services  CM Consult  Post Acute Care Choice:  Durable Medical Equipment(rw) Choice offered to:  Patient  DME Arranged:    DME Agency:     HH Arranged:  Patient Refused [facility] Agency:     Status of Service:  Completed, signed off  If discussed at [employer], dates discussed:    Additional Comments:  Dessa Phi, RN 05/03/2018, 1:27 PM

## 2018-05-03 NOTE — Progress Notes (Signed)
Noted d/c order 10/3-medically stable. PT recc SNF-CSW following.

## 2018-05-03 NOTE — Progress Notes (Signed)
Physical Therapy Treatment Patient Details Name: Warren Blankenship MRN: [id number] DOB: January 28, 1956 Today's Date: 05/03/2018    History of Present Illness Pt was admitted for biopsy of lung mass; has hilar and mediastinal adenopathy. Recently admitted with weakness, near syncopal episode, dehydration. PMH includes ARF, cachexia, tobacco abuse, previous alcohol abuse.     PT Comments    Pt doing well today, progressing toward goals; pt able to amb 150', perform sitting exercise without significant fatigue; given progress since eval, feel pt should be able to d/c home (rather than SNF as recommended on eval);  Pt has RW at home; will continue to follow  Follow Up Recommendations  Home health PT;Supervision - Intermittent     Equipment Recommendations  Other (comment)(has walker)    Recommendations for Other Services       Precautions / Restrictions Restrictions Weight Bearing Restrictions: No    Mobility  Bed Mobility   Bed Mobility: Supine to Sit     Supine to sit: Modified independent (Device/Increase time)        Transfers Overall transfer level: Modified independent Equipment used: Rolling walker (2 wheeled) Transfers: Sit to/from Stand Sit to Stand: Modified independent (Device/Increase time)         General transfer comment: no assist, no LOB, transitions to RW without difficulty  Ambulation/Gait Ambulation/Gait assistance: Min guard;Supervision Gait Distance (Feet): 150 Feet Assistive device: Rolling walker (2 wheeled) Gait Pattern/deviations: Step-through pattern;Decreased stride length;Trunk flexed   Gait velocity interpretation: <1.31 ft/sec, indicative of household ambulator(1.18 ft/sec) General Gait Details: cues for RW position, posture    Stairs             Wheelchair Mobility    Modified Rankin (Stroke Patients Only)       Balance   Sitting-balance support: No upper extremity supported;Feet supported Sitting balance-Leahy Scale:  Good     Standing balance support: No upper extremity supported;Bilateral upper extremity supported;During functional activity Standing balance-Leahy Scale: Fair Standing balance comment: relies on RW for dynamic activity; able to maintain static without support                             Cognition Arousal/Alertness: Awake/alert Behavior During Therapy: WFL for tasks assessed/performed Overall Cognitive Status: Within Functional Limits for tasks assessed                                        Exercises   LAQs x10 bil LEs in sitting Scapular retraction x5 in sitting    General Comments        Pertinent Vitals/Pain Pain Assessment: No/denies pain    Home Living                      Prior Function            PT Goals (current goals can now be found in the care plan section) Acute Rehab PT Goals PT Goal Formulation: With patient Time For Goal Achievement: 05/14/18 Potential to Achieve Goals: Good Progress towards PT goals: Progressing toward goals    Frequency    Min 3X/week      PT Plan Discharge plan needs to be updated;Frequency needs to be updated    Co-evaluation              AM-PAC PT "6 Clicks" Daily Activity  Outcome Measure  Difficulty turning over in bed (including adjusting bedclothes, sheets and blankets)?: None Difficulty moving from lying on back to sitting on the side of the bed? : None Difficulty sitting down on and standing up from a chair with arms (e.g., wheelchair, bedside commode, etc,.)?: A Little Help needed moving to and from a bed to chair (including a wheelchair)?: A Little Help needed walking in hospital room?: A Little Help needed climbing 3-5 steps with a railing? : A Little 6 Click Score: 20    End of Session Equipment Utilized During Treatment: Gait belt Activity Tolerance: Patient tolerated treatment well Patient left: in bed;with call bell/phone within reach;with bed alarm set    PT Visit Diagnosis: Unsteadiness on feet (R26.81);Difficulty in walking, not elsewhere classified (R26.2)     Time: 5621-3086 PT Time Calculation (min) (ACUTE ONLY): 14 min  Charges:  $Gait Training: 8-22 mins                     Kenyon Ana, PT Pager: [phone] 05/03/2018   [facility] [phone]     [facility] 05/03/2018, 12:38 PM

## 2018-05-06 ENCOUNTER — Ambulatory Visit
Admission: RE | Admit: 2018-05-06 | Discharge: 2018-05-06 | Disposition: A | Discharge status: Home / Self Care | Payer: Self-pay | Source: Ambulatory Visit | Attending: Radiation Oncology | Admitting: Radiation Oncology

## 2018-05-06 ENCOUNTER — Encounter: Payer: Self-pay | Admitting: Radiation Oncology

## 2018-05-06 DIAGNOSIS — C3401 Malignant neoplasm of right main bronchus: Secondary | ICD-10-CM

## 2018-05-06 MED ORDER — SONAFINE EX EMUL
1.0000 "application " | Freq: Once | CUTANEOUS | Status: AC
  Administered 2018-05-06: 1 via TOPICAL

## 2018-05-06 NOTE — Progress Notes (Signed)
I spoke with the patient and his daughter in clinic to recap our discussion of treatment plans and expectations. He's due for PET this week and to see Dr. Julien Nordmann to discuss adding chemotherapy to his regimen.

## 2018-05-06 NOTE — Progress Notes (Signed)
Pt here for patient teaching.  Pt given Radiation and You booklet, skin care instructions and Sonafine.  Reviewed areas of pertinence such as fatigue, hair loss, skin changes and throat changes . Pt able to give teach back of to pat skin and use unscented/gentle soap,apply Sonafine bid and avoid applying anything to skin within 4 hours of treatment. Pt verbalizes understanding of information given and will contact nursing with any questions or concerns.     Gloriajean Dell. Leonie Green, BSN

## 2018-05-07 ENCOUNTER — Telehealth: Payer: Self-pay | Admitting: Internal Medicine

## 2018-05-07 ENCOUNTER — Encounter: Payer: Self-pay | Admitting: *Deleted

## 2018-05-07 ENCOUNTER — Ambulatory Visit: Admission: RE | Admit: 2018-05-07 | Payer: Self-pay | Source: Ambulatory Visit

## 2018-05-07 DIAGNOSIS — C3401 Malignant neoplasm of right main bronchus: Secondary | ICD-10-CM

## 2018-05-07 NOTE — Telephone Encounter (Signed)
Tried to call it could not be completed at the time

## 2018-05-07 NOTE — Progress Notes (Signed)
Oncology Nurse Navigator Documentation  Oncology Nurse Navigator Flowsheets 05/07/2018  Navigator Location [facility]  Navigator Encounter Type Other/I followed up with Dr. Julien Nordmann regarding follow up appt.  He would like to see patient near the end of radiation and Guardant 360 blood test this week.  I will updated scheduling.   Treatment Phase Treatment  Barriers/Navigation Needs Coordination of Care  Interventions Coordination of Care  Coordination of Care Other  Acuity Level 2  Time Spent with Patient 30

## 2018-05-07 NOTE — Telephone Encounter (Signed)
Tried to call it could not be completed

## 2018-05-08 ENCOUNTER — Ambulatory Visit (HOSPITAL_COMMUNITY): Payer: Self-pay

## 2018-05-08 ENCOUNTER — Telehealth: Payer: Self-pay | Admitting: *Deleted

## 2018-05-08 ENCOUNTER — Ambulatory Visit
Admission: RE | Admit: 2018-05-08 | Discharge: 2018-05-08 | Disposition: A | Discharge status: Home / Self Care | Payer: Self-pay | Source: Ambulatory Visit | Attending: Radiation Oncology | Admitting: Radiation Oncology

## 2018-05-08 ENCOUNTER — Encounter: Payer: Self-pay | Admitting: *Deleted

## 2018-05-08 NOTE — Progress Notes (Signed)
Oncology Nurse Navigator Documentation  Oncology Nurse Navigator Flowsheets 05/08/2018  Navigator Location [facility]  Navigator Encounter Type Treatment/I spoke with patient and his son Lynnae Sandhoff today.  Mr. Tailor left for his rad onc appt and I was able to speak to the son.  He updated me that patient is staying with his sister and he is bringing him to appts.  Patient does have a phone [phone] and to call that or any family member for an appt.  I explained next steps with follow up and appts.  I will update EMR on patients phone number to call  Treatment Initiated Date 04/30/2018  Patient Visit Type RadOnc  Treatment Phase Treatment  Barriers/Navigation Needs Education;Coordination of Care  Education Other  Interventions Coordination of Care;Education  Coordination of Care Other  Education Method Verbal  Acuity Level 2  Time Spent with Patient 54

## 2018-05-08 NOTE — Telephone Encounter (Signed)
Oncology Nurse Navigator Documentation  Oncology Nurse Navigator Flowsheets 05/08/2018  Navigator Location [facility]  Navigator Encounter Type Telephone/I called to see if I could reach Warren Blankenship care giver. I reached Warren Blankenship his girlfriend and he is not living with her.  He is with his daughter.  I have tried to call the daughter several times and unable to reach her or him with an update on his appts.  Will catch up with patient at cancer center   Telephone Outgoing Call  Treatment Phase Treatment  Barriers/Navigation Needs Coordination of Care  Interventions Coordination of Care  Coordination of Care Other  Acuity Level 1  Time Spent with Patient 15

## 2018-05-09 ENCOUNTER — Ambulatory Visit
Admission: RE | Admit: 2018-05-09 | Discharge: 2018-05-09 | Disposition: A | Discharge status: Home / Self Care | Payer: Self-pay | Source: Ambulatory Visit | Attending: Radiation Oncology | Admitting: Radiation Oncology

## 2018-05-09 ENCOUNTER — Inpatient Hospital Stay: Payer: Self-pay | Attending: Internal Medicine

## 2018-05-09 DIAGNOSIS — F17200 Nicotine dependence, unspecified, uncomplicated: Secondary | ICD-10-CM | POA: Insufficient documentation

## 2018-05-09 DIAGNOSIS — D649 Anemia, unspecified: Secondary | ICD-10-CM | POA: Insufficient documentation

## 2018-05-09 DIAGNOSIS — C3401 Malignant neoplasm of right main bronchus: Secondary | ICD-10-CM | POA: Insufficient documentation

## 2018-05-09 DIAGNOSIS — I700 Atherosclerosis of aorta: Secondary | ICD-10-CM | POA: Insufficient documentation

## 2018-05-09 DIAGNOSIS — Z79899 Other long term (current) drug therapy: Secondary | ICD-10-CM | POA: Insufficient documentation

## 2018-05-09 DIAGNOSIS — Z5111 Encounter for antineoplastic chemotherapy: Secondary | ICD-10-CM | POA: Insufficient documentation

## 2018-05-10 ENCOUNTER — Ambulatory Visit (HOSPITAL_COMMUNITY)
Admission: RE | Admit: 2018-05-10 | Discharge: 2018-05-10 | Disposition: A | Discharge status: Home / Self Care | Payer: Self-pay | Source: Ambulatory Visit | Attending: Radiation Oncology | Admitting: Radiation Oncology

## 2018-05-10 ENCOUNTER — Other Ambulatory Visit: Payer: Self-pay | Admitting: Internal Medicine

## 2018-05-10 ENCOUNTER — Ambulatory Visit
Admission: RE | Admit: 2018-05-10 | Discharge: 2018-05-10 | Disposition: A | Discharge status: Home / Self Care | Payer: Self-pay | Source: Ambulatory Visit | Attending: Radiation Oncology | Admitting: Radiation Oncology

## 2018-05-10 ENCOUNTER — Telehealth: Payer: Self-pay | Admitting: Internal Medicine

## 2018-05-10 DIAGNOSIS — C3491 Malignant neoplasm of unspecified part of right bronchus or lung: Secondary | ICD-10-CM | POA: Insufficient documentation

## 2018-05-10 DIAGNOSIS — R918 Other nonspecific abnormal finding of lung field: Secondary | ICD-10-CM | POA: Insufficient documentation

## 2018-05-10 DIAGNOSIS — C3490 Malignant neoplasm of unspecified part of unspecified bronchus or lung: Secondary | ICD-10-CM | POA: Insufficient documentation

## 2018-05-10 LAB — GLUCOSE, CAPILLARY: Glucose-Capillary: 74 mg/dL (ref 70–99)

## 2018-05-10 MED ORDER — FLUDEOXYGLUCOSE F - 18 (FDG) INJECTION
4.9000 | Freq: Once | INTRAVENOUS | Status: AC | PRN
  Administered 2018-05-10: 4.9 via INTRAVENOUS

## 2018-05-10 MED ORDER — PROCHLORPERAZINE MALEATE 10 MG PO TABS: 10.0000 mg | ORAL_TABLET | Freq: Four times a day (QID) | ORAL | 0 refills | Status: DC | PRN

## 2018-05-10 NOTE — Progress Notes (Signed)
START ON PATHWAY REGIMEN - Non-Small Cell Lung     Administer weekly:     Paclitaxel      Carboplatin   **Always confirm dose/schedule in your pharmacy ordering system**  Patient Characteristics: Stage III - Unresectable, PS = 0, 1 AJCC T Category: T4 Current Disease Status: No Distant Mets or Local Recurrence AJCC N Category: N2 AJCC M Category: M0 AJCC 8 Stage Grouping: IIIB Performance Status: PS = 0, 1 Intent of Therapy: Curative Intent, Discussed with Patient

## 2018-05-10 NOTE — Telephone Encounter (Signed)
Spoke with patients daughter [name] - she is aware of pt's appt date and time per 10/11 sch message - will tell patient all appts.

## 2018-05-13 ENCOUNTER — Inpatient Hospital Stay: Payer: Self-pay

## 2018-05-13 ENCOUNTER — Ambulatory Visit
Admission: RE | Admit: 2018-05-13 | Discharge: 2018-05-13 | Disposition: A | Discharge status: Home / Self Care | Payer: Self-pay | Source: Ambulatory Visit | Attending: Radiation Oncology | Admitting: Radiation Oncology

## 2018-05-13 DIAGNOSIS — C3401 Malignant neoplasm of right main bronchus: Secondary | ICD-10-CM

## 2018-05-13 DIAGNOSIS — C3491 Malignant neoplasm of unspecified part of right bronchus or lung: Secondary | ICD-10-CM

## 2018-05-13 LAB — CBC WITH DIFFERENTIAL (CANCER CENTER ONLY)
ABS IMMATURE GRANULOCYTES: 0.03 10*3/uL (ref 0.00–0.07)
BASOS ABS: 0 10*3/uL (ref 0.0–0.1)
Basophils Relative: 1 %
Eosinophils Absolute: 0 10*3/uL (ref 0.0–0.5)
Eosinophils Relative: 1 %
HEMATOCRIT: 26.2 % — AB (ref 39.0–52.0)
HEMOGLOBIN: 7.9 g/dL — AB (ref 13.0–17.0)
IMMATURE GRANULOCYTES: 0 %
LYMPHS PCT: 10 %
Lymphs Abs: 0.8 10*3/uL (ref 0.7–4.0)
MCH: 26.7 pg (ref 26.0–34.0)
MCHC: 30.2 g/dL (ref 30.0–36.0)
MCV: 88.5 fL (ref 80.0–100.0)
Monocytes Absolute: 1 10*3/uL (ref 0.1–1.0)
Monocytes Relative: 13 %
NEUTROS ABS: 6 10*3/uL (ref 1.7–7.7)
NEUTROS PCT: 75 %
NRBC: 0 % (ref 0.0–0.2)
Platelet Count: 380 10*3/uL (ref 150–400)
RBC: 2.96 MIL/uL — ABNORMAL LOW (ref 4.22–5.81)
RDW: 17 % — AB (ref 11.5–15.5)
WBC Count: 7.9 10*3/uL (ref 4.0–10.5)

## 2018-05-13 LAB — CMP (CANCER CENTER ONLY)
ALK PHOS: 114 U/L (ref 38–126)
ALT: 33 U/L (ref 0–44)
AST: 46 U/L — AB (ref 15–41)
Albumin: 1.9 g/dL — ABNORMAL LOW (ref 3.5–5.0)
Anion gap: 10 (ref 5–15)
BUN: 13 mg/dL (ref 8–23)
CALCIUM: 9.4 mg/dL (ref 8.9–10.3)
CHLORIDE: 97 mmol/L — AB (ref 98–111)
CO2: 27 mmol/L (ref 22–32)
CREATININE: 0.62 mg/dL (ref 0.61–1.24)
GFR, Est AFR Am: >60 mL/min (ref >60)
GFR, Estimated: >60 mL/min (ref >60)
Glucose, Bld: 85 mg/dL (ref 70–99)
Potassium: 4.2 mmol/L (ref 3.5–5.1)
Sodium: 134 mmol/L — ABNORMAL LOW (ref 135–145)
Total Bilirubin: 0.5 mg/dL (ref 0.3–1.2)
Total Protein: 7.3 g/dL (ref 6.5–8.1)

## 2018-05-14 ENCOUNTER — Ambulatory Visit
Admission: RE | Admit: 2018-05-14 | Discharge: 2018-05-14 | Disposition: A | Discharge status: Home / Self Care | Payer: Self-pay | Source: Ambulatory Visit | Attending: Radiation Oncology | Admitting: Radiation Oncology

## 2018-05-14 ENCOUNTER — Inpatient Hospital Stay: Payer: Self-pay

## 2018-05-14 ENCOUNTER — Encounter: Payer: Self-pay | Admitting: Internal Medicine

## 2018-05-14 ENCOUNTER — Telehealth: Payer: Self-pay | Admitting: Internal Medicine

## 2018-05-14 ENCOUNTER — Inpatient Hospital Stay (HOSPITAL_BASED_OUTPATIENT_CLINIC_OR_DEPARTMENT_OTHER): Payer: Self-pay | Admitting: Internal Medicine

## 2018-05-14 VITALS — BP 100/66 | HR 94 | Temp 98.7°F | Resp 20

## 2018-05-14 VITALS — BP 92/51 | HR 115 | Temp 98.0°F | Resp 16 | Ht 67.0 in | Wt 99.7 lb

## 2018-05-14 DIAGNOSIS — F17200 Nicotine dependence, unspecified, uncomplicated: Secondary | ICD-10-CM

## 2018-05-14 DIAGNOSIS — R627 Adult failure to thrive: Secondary | ICD-10-CM

## 2018-05-14 DIAGNOSIS — Z7189 Other specified counseling: Secondary | ICD-10-CM

## 2018-05-14 DIAGNOSIS — C3491 Malignant neoplasm of unspecified part of right bronchus or lung: Secondary | ICD-10-CM

## 2018-05-14 DIAGNOSIS — C3401 Malignant neoplasm of right main bronchus: Secondary | ICD-10-CM

## 2018-05-14 DIAGNOSIS — Z5111 Encounter for antineoplastic chemotherapy: Secondary | ICD-10-CM | POA: Insufficient documentation

## 2018-05-14 MED ORDER — FAMOTIDINE IN NACL 20-0.9 MG/50ML-% IV SOLN
20.0000 mg | Freq: Once | INTRAVENOUS | Status: AC
  Administered 2018-05-14: 20 mg via INTRAVENOUS

## 2018-05-14 MED ORDER — FAMOTIDINE IN NACL 20-0.9 MG/50ML-% IV SOLN
INTRAVENOUS | Status: AC
  Filled 2018-05-14: qty 50

## 2018-05-14 MED ORDER — SODIUM CHLORIDE 0.9 % IV SOLN
168.8000 mg | Freq: Once | INTRAVENOUS | Status: AC
  Administered 2018-05-14: 170 mg via INTRAVENOUS
  Filled 2018-05-14: qty 17

## 2018-05-14 MED ORDER — SODIUM CHLORIDE 0.9 % IV SOLN
45.0000 mg/m2 | Freq: Once | INTRAVENOUS | Status: AC
  Administered 2018-05-14: 66 mg via INTRAVENOUS
  Filled 2018-05-14: qty 11

## 2018-05-14 MED ORDER — SODIUM CHLORIDE 0.9 % IV SOLN
45.0000 mg/m2 | Freq: Once | INTRAVENOUS | Status: DC
  Filled 2018-05-14: qty 11

## 2018-05-14 MED ORDER — PALONOSETRON HCL INJECTION 0.25 MG/5ML
0.2500 mg | Freq: Once | INTRAVENOUS | Status: AC
  Administered 2018-05-14: 0.25 mg via INTRAVENOUS

## 2018-05-14 MED ORDER — PALONOSETRON HCL INJECTION 0.25 MG/5ML
INTRAVENOUS | Status: AC
  Filled 2018-05-14: qty 5

## 2018-05-14 MED ORDER — SODIUM CHLORIDE 0.9 % IV SOLN
Freq: Once | INTRAVENOUS | Status: AC
  Administered 2018-05-14: 10:00:00 via INTRAVENOUS
  Filled 2018-05-14: qty 250

## 2018-05-14 MED ORDER — DIPHENHYDRAMINE HCL 50 MG/ML IJ SOLN
INTRAMUSCULAR | Status: AC
  Filled 2018-05-14: qty 1

## 2018-05-14 MED ORDER — SODIUM CHLORIDE 0.9 % IV SOLN
20.0000 mg | Freq: Once | INTRAVENOUS | Status: AC
  Administered 2018-05-14: 20 mg via INTRAVENOUS
  Filled 2018-05-14: qty 2

## 2018-05-14 MED ORDER — DIPHENHYDRAMINE HCL 50 MG/ML IJ SOLN
50.0000 mg | Freq: Once | INTRAMUSCULAR | Status: AC
  Administered 2018-05-14: 50 mg via INTRAVENOUS

## 2018-05-14 NOTE — Progress Notes (Signed)
Went to infusion to introduce myself as Arboriculturist. Advised patient he may contact me should he have any financial questions or concerns.  Advised him that he may apply for a one-time $500 [place] to assist with personal expenses wile going through treatment. Explained proof of his income would be needed to apply. Advised him he may bring at his next visit if he wishes.Gave my card for any additional financial questions or concerns. He verbalized understanding.

## 2018-05-14 NOTE — Progress Notes (Signed)
[facility] [phone]   [phone]  OFFICE PROGRESS NOTE  Patient, No Pcp Per No address on file  DIAGNOSIS:  Stage IIIB (T4, N2, M0)) non-small cell lung cancer, poorly differentiated carcinoma favoring adenocarcinoma presented with large right hilar and upper lobe lung mass with right hilar and mediastinal lymphadenopathy and compression of right mainstem bronchus as well as right middle lobe bronchus diagnosed in September 2019.  PRIOR THERAPY: None.  CURRENT THERAPY: Concurrent chemoradiation with weekly carboplatin for AUC of 2 and paclitaxel 45 NG/M2.  First dose May 14, 2018.  INTERVAL HISTORY: Warren Blankenship 62 y.o. male returns to the clinic today for follow-up visit accompanied by his son.  The patient is feeling fine today with no specific complaints except for mild shortness of breath and cough with occasional blood tinged sputum.  He denied having any frank hemoptysis or chest pain.  He denied having any nausea, vomiting, diarrhea or constipation.  He has no fever or chills.  He denied having any recent weight loss or night sweats.  He was started on radiotherapy and has been tolerating this treatment well.  He is here for evaluation before starting the first cycle of his concurrent chemotherapy.  MEDICAL HISTORY: Past Medical History:  Diagnosis Date  . Mass of right lung   . Tobacco abuse     ALLERGIES:  has No Known Allergies.  MEDICATIONS:  Current Outpatient Medications  Medication Sig Dispense Refill  . acetaminophen (TYLENOL) 325 MG tablet Take 2 tablets (650 mg total) by mouth every 6 (six) hours as needed for mild pain (or Fever >/= 101).    . lactose free nutrition (BOOST PLUS) LIQD Take 237 mLs by mouth 3 (three) times daily between meals. 90 Can 0  . magnesium oxide (MAG-OX) 400 (241.3 Mg) MG tablet Take 1 tablet (400 mg total) by mouth daily. 30 tablet   . Multiple Vitamin (MULTIVITAMIN WITH MINERALS) TABS  tablet Take 1 tablet by mouth daily.    . nicotine (NICODERM CQ - DOSED IN MG/24 HR) 7 mg/24hr patch Place 1 patch (7 mg total) onto the skin daily. 28 patch 0  . polyethylene glycol (MIRALAX / GLYCOLAX) packet Take 17 g by mouth daily as needed for mild constipation. 14 each 0  . prochlorperazine (COMPAZINE) 10 MG tablet Take 1 tablet (10 mg total) by mouth every 6 (six) hours as needed for nausea or vomiting. 30 tablet 0   No current facility-administered medications for this visit.     SURGICAL HISTORY:  Past Surgical History:  Procedure Laterality Date  . BIOPSY  04/29/2018   Procedure: BIOPSY;  Surgeon: [facility] Nash, DO;  Location: [facility];  Service: Cardiopulmonary;;  . BRONCHIAL WASHINGS  04/29/2018   Procedure: BRONCHIAL WASHINGS;  Surgeon: [facility] Nash, DO;  Location: [facility];  Service: Cardiopulmonary;;  . ENDOBRONCHIAL ULTRASOUND Bilateral 04/29/2018   Procedure: ENDOBRONCHIAL ULTRASOUND;  Surgeon: [facility] Nash, DO;  Location: [facility];  Service: Cardiopulmonary;  Laterality: Bilateral;  . Endobronchial Ultrasound (EBUS), Mediastinum  Bilateral 04/29/2018   Dr. Valeta Harms   . FINE NEEDLE ASPIRATION BIOPSY  04/29/2018   Procedure: FINE NEEDLE ASPIRATION BIOPSY;  Surgeon: [facility] Nash, DO;  Location: [facility];  Service: Cardiopulmonary;;  . FLEXIBLE BRONCHOSCOPY  04/29/2018   Procedure: FLEXIBLE BRONCHOSCOPY;  Surgeon: [facility] Nash, DO;  Location: [facility];  Service: Cardiopulmonary;;  . TONSILLECTOMY      REVIEW OF SYSTEMS:  Constitutional: positive for  fatigue Eyes: negative Ears, nose, mouth, throat, and face: negative Respiratory: positive for cough and dyspnea on exertion Cardiovascular: negative Gastrointestinal: negative Genitourinary:negative Integument/breast: negative Hematologic/lymphatic: negative Musculoskeletal:negative Neurological: negative Behavioral/Psych: negative Endocrine: negative Allergic/Immunologic: negative    PHYSICAL EXAMINATION: General appearance: alert, cooperative, fatigued and no distress Head: Normocephalic, without obvious abnormality, atraumatic Neck: no adenopathy, no JVD, supple, symmetrical, trachea midline and thyroid not enlarged, symmetric, no tenderness/mass/nodules Lymph nodes: Cervical, supraclavicular, and axillary nodes normal. Resp: diminished breath sounds RUL and dullness to percussion RUL Back: symmetric, no curvature. ROM normal. No CVA tenderness. Cardio: regular rate and rhythm, S1, S2 normal, no murmur, click, rub or gallop GI: soft, non-tender; bowel sounds normal; no masses,  no organomegaly Extremities: extremities normal, atraumatic, no cyanosis or edema Neurologic: Alert and oriented X 3, normal strength and tone. Normal symmetric reflexes. Normal coordination and gait  ECOG PERFORMANCE STATUS: 1 - Symptomatic but completely ambulatory  Blood pressure (!) 92/51, pulse (!) 115, temperature 98 F (36.7 C), temperature source Oral, resp. rate 16, height _0  (1.702 m), weight 99 lb 11.2 oz (45.2 kg), SpO2 95 %.  LABORATORY DATA: Lab Results  Component Value Date   WBC 7.9 05/13/2018   HGB 7.9 (L) 05/13/2018   HCT 26.2 (L) 05/13/2018   MCV 88.5 05/13/2018   PLT 380 05/13/2018      Chemistry      Component Value Date/Time   NA 134 (L) 05/13/2018 1505   K 4.2 05/13/2018 1505   CL 97 (L) 05/13/2018 1505   CO2 27 05/13/2018 1505   BUN 13 05/13/2018 1505   CREATININE 0.62 05/13/2018 1505      Component Value Date/Time   CALCIUM 9.4 05/13/2018 1505   ALKPHOS 114 05/13/2018 1505   AST 46 (H) 05/13/2018 1505   ALT 33 05/13/2018 1505   BILITOT 0.5 05/13/2018 1505       RADIOGRAPHIC STUDIES: Nm Pet Image Initial (pi) Skull Base To Thigh  Result Date: 05/10/2018 CLINICAL DATA:  Initial treatment strategy for right lung mass and mediastinal adenopathy. EXAM: NUCLEAR MEDICINE PET SKULL BASE TO THIGH TECHNIQUE: 4.9 mCi F-18 FDG was injected  intravenously. Full-ring PET imaging was performed from the skull base to thigh after the radiotracer. CT data was obtained and used for attenuation correction and anatomic localization. Fasting blood glucose: 74 mg/dl COMPARISON:  CT chest 04/12/2018 FINDINGS: Mediastinal blood pool activity: SUV max 1.56 NECK: No hypermetabolic lymph nodes in the neck. Incidental CT findings: Bilateral carotid artery calcifications. CHEST: Large right hilar and mediastinal mass is markedly hypermetabolic with SUV max of 11.94. I do not see the right upper lobe bronchus at all and I think part of the right upper lobe is collapsed against the hilum and mediastinum. Interstitial thickening and nodularity peripheral to the mass is worrisome for interstitial spread of tumor and possible endobronchial spread of tumor. I do not see any definite metastatic pulmonary nodules in the left lung. No contralateral mediastinal or hilar adenopathy. No supraclavicular or axillary lymphadenopathy. Incidental CT findings: none ABDOMEN/PELVIS: No abnormal hypermetabolic activity within the liver, pancreas, adrenal glands, or spleen. No hypermetabolic lymph nodes in the abdomen or pelvis. There is marked hypermetabolism (SUV max 7.52) in the anorectal region. This could be due to internal hemorrhoids but recommend correlation with digital rectal examination. Incidental CT findings: Advanced atherosclerotic calcifications involving the aorta and iliac arteries. SKELETON: No focal hypermetabolic activity to suggest skeletal metastasis. Incidental CT findings: none IMPRESSION: 1. Probable large medial right upper lobe  mass invading the hilum and mediastinum which demonstrates marked hypermetabolism and is consistent with neoplasm. Tumor is occluding the right upper lobe bronchus. Recommend bronchoscopic biopsy. 2. Interstitial thickening and nodularity in the lung surrounding the mass is worrisome for interstitial spread of tumor. 3. No findings for  metastatic disease involving the neck, abdomen/pelvis or bony structures. 4. Hypermetabolism in the anorectal region probably due to hemorrhoids but recommend physical examination. Electronically Signed   By: Marijo Sanes M.D.   On: 05/10/2018 17:03    ASSESSMENT AND PLAN: This is a very pleasant 62 years old white male recently diagnosed with a stage IIIb non-small cell lung cancer.  The patient is here today for evaluation before starting the first cycle of concurrent chemotherapy.  It was recommended for the patient to proceed with a course of concurrent chemoradiation with weekly carboplatin for AUC of 2 and paclitaxel 45 NG/M2.  He started radiation last week.  The patient is feeling fine and tolerating his radiotherapy fairly well. I had a lengthy discussion with the patient and his son today about his chemotherapy and the adverse effect of this treatment including but not limited to alopecia, myelosuppression, nausea and vomiting, peripheral neuropathy, liver or renal dysfunction. The patient is expected to start the first cycle of this treatment today. I will see him back for follow-up visit in 2 weeks for evaluation before starting cycle #3. He will have a chemotherapy education class before the first cycle of his treatment. I will send blood test to [facility] for molecular studies on this patient today. The patient was advised to call immediately if he has any concerning symptoms in the interval. The patient voices understanding of current disease status and treatment options and is in agreement with the current care plan.  All questions were answered. The patient knows to call the clinic with any problems, questions or concerns. We can certainly see the patient much sooner if necessary.  Disclaimer: This note was dictated with voice recognition software. Similar sounding words can inadvertently be transcribed and may not be corrected upon review.

## 2018-05-14 NOTE — Progress Notes (Signed)
Per Dr Julien Nordmann ok to tx today with Hgb 7.9 and heart rate of 101. Per Dr Earlie Server pt to have blood transfusion later this week. Diane dest RN will set up appt.

## 2018-05-14 NOTE — Telephone Encounter (Signed)
appts already scheduled per 10/15 los - per MM no labs needed today - labs already drawn yesterday

## 2018-05-14 NOTE — Patient Instructions (Signed)
[facility] Discharge Instructions for Patients Receiving Chemotherapy  Today you received the following chemotherapy agents Taxol and Carboplatin  To help prevent nausea and vomiting after your treatment, we encourage you to take your nausea medication as prescribed.  If you develop nausea and vomiting that is not controlled by your nausea medication, call the clinic.   BELOW ARE SYMPTOMS THAT SHOULD BE REPORTED IMMEDIATELY:  *FEVER GREATER THAN 100.5 F  *CHILLS WITH OR WITHOUT FEVER  NAUSEA AND VOMITING THAT IS NOT CONTROLLED WITH YOUR NAUSEA MEDICATION  *UNUSUAL SHORTNESS OF BREATH  *UNUSUAL BRUISING OR BLEEDING  TENDERNESS IN MOUTH AND THROAT WITH OR WITHOUT PRESENCE OF ULCERS  *URINARY PROBLEMS  *BOWEL PROBLEMS  UNUSUAL RASH Items with * indicate a potential emergency and should be followed up as soon as possible.  Feel free to call the clinic should you have any questions or concerns. The clinic phone number is [phone].  Please show the [facility] at check-in to the Emergency Department and triage nurse.  Paclitaxel injection (Taxol) What is this medicine? PACLITAXEL (PAK li TAX el) is a chemotherapy drug. It targets fast dividing cells, like cancer cells, and causes these cells to die. This medicine is used to treat ovarian cancer, breast cancer, and other cancers. This medicine may be used for other purposes; ask your health care provider or pharmacist if you have questions. COMMON BRAND NAME(S): Onxol, Taxol What should I tell my health care provider before I take this medicine? They need to know if you have any of these conditions: -blood disorders -irregular heartbeat -infection (especially a virus infection such as chickenpox, cold sores, or herpes) -liver disease -previous or ongoing radiation therapy -an unusual or allergic reaction to paclitaxel, alcohol, polyoxyethylated castor oil, other chemotherapy agents, other medicines,  foods, dyes, or preservatives -pregnant or trying to get pregnant -breast-feeding How should I use this medicine? This drug is given as an infusion into a vein. It is administered in a hospital or clinic by a specially trained health care professional. Talk to your pediatrician regarding the use of this medicine in children. Special care may be needed. Overdosage: If you think you have taken too much of this medicine contact a poison control center or emergency room at once. NOTE: This medicine is only for you. Do not share this medicine with others. What if I miss a dose? It is important not to miss your dose. Call your doctor or health care professional if you are unable to keep an appointment. What may interact with this medicine? Do not take this medicine with any of the following medications: -disulfiram -metronidazole This medicine may also interact with the following medications: -cyclosporine -diazepam -ketoconazole -medicines to increase blood counts like filgrastim, pegfilgrastim, sargramostim -other chemotherapy drugs like cisplatin, doxorubicin, epirubicin, etoposide, teniposide, vincristine -quinidine -testosterone -vaccines -verapamil Talk to your doctor or health care professional before taking any of these medicines: -acetaminophen -aspirin -ibuprofen -ketoprofen -naproxen This list may not describe all possible interactions. Give your health care provider a list of all the medicines, herbs, non-prescription drugs, or dietary supplements you use. Also tell them if you smoke, drink alcohol, or use illegal drugs. Some items may interact with your medicine. What should I watch for while using this medicine? Your condition will be monitored carefully while you are receiving this medicine. You will need important blood work done while you are taking this medicine. This medicine can cause serious allergic reactions. To reduce your risk you will need  to take other  medicine(s) before treatment with this medicine. If you experience allergic reactions like skin rash, itching or hives, swelling of the face, lips, or tongue, tell your doctor or health care professional right away. In some cases, you may be given additional medicines to help with side effects. Follow all directions for their use. This drug may make you feel generally unwell. This is not uncommon, as chemotherapy can affect healthy cells as well as cancer cells. Report any side effects. Continue your course of treatment even though you feel ill unless your doctor tells you to stop. Call your doctor or health care professional for advice if you get a fever, chills or sore throat, or other symptoms of a cold or flu. Do not treat yourself. This drug decreases your body's ability to fight infections. Try to avoid being around people who are sick. This medicine may increase your risk to bruise or bleed. Call your doctor or health care professional if you notice any unusual bleeding. Be careful brushing and flossing your teeth or using a toothpick because you may get an infection or bleed more easily. If you have any dental work done, tell your dentist you are receiving this medicine. Avoid taking products that contain aspirin, acetaminophen, ibuprofen, naproxen, or ketoprofen unless instructed by your doctor. These medicines may hide a fever. Do not become pregnant while taking this medicine. Women should inform their doctor if they wish to become pregnant or think they might be pregnant. There is a potential for serious side effects to an unborn child. Talk to your health care professional or pharmacist for more information. Do not breast-feed an infant while taking this medicine. Men are advised not to father a child while receiving this medicine. This product may contain alcohol. Ask your pharmacist or healthcare provider if this medicine contains alcohol. Be sure to tell all healthcare providers you are  taking this medicine. Certain medicines, like metronidazole and disulfiram, can cause an unpleasant reaction when taken with alcohol. The reaction includes flushing, headache, nausea, vomiting, sweating, and increased thirst. The reaction can last from 30 minutes to several hours. What side effects may I notice from receiving this medicine? Side effects that you should report to your doctor or health care professional as soon as possible: -allergic reactions like skin rash, itching or hives, swelling of the face, lips, or tongue -low blood counts - This drug may decrease the number of white blood cells, red blood cells and platelets. You may be at increased risk for infections and bleeding. -signs of infection - fever or chills, cough, sore throat, pain or difficulty passing urine -signs of decreased platelets or bleeding - bruising, pinpoint red spots on the skin, black, tarry stools, nosebleeds -signs of decreased red blood cells - unusually weak or tired, fainting spells, lightheadedness -breathing problems -chest pain -high or low blood pressure -mouth sores -nausea and vomiting -pain, swelling, redness or irritation at the injection site -pain, tingling, numbness in the hands or feet -slow or irregular heartbeat -swelling of the ankle, feet, hands Side effects that usually do not require medical attention (report to your doctor or health care professional if they continue or are bothersome): -bone pain -complete hair loss including hair on your head, underarms, pubic hair, eyebrows, and eyelashes -changes in the color of fingernails -diarrhea -loosening of the fingernails -loss of appetite -muscle or joint pain -red flush to skin -sweating This list may not describe all possible side effects. Call your doctor for medical advice  about side effects. You may report side effects to FDA at 1-800-FDA-1088. Where should I keep my medicine? This drug is given in a hospital or clinic and  will not be stored at home. NOTE: This sheet is a summary. It may not cover all possible information. If you have questions about this medicine, talk to your doctor, pharmacist, or health care provider.  2018 Elsevier/Gold Standard (2015-05-18 19:58:00)   Carboplatin injection What is this medicine? CARBOPLATIN (KAR boe pla tin) is a chemotherapy drug. It targets fast dividing cells, like cancer cells, and causes these cells to die. This medicine is used to treat ovarian cancer and many other cancers. This medicine may be used for other purposes; ask your health care provider or pharmacist if you have questions. COMMON BRAND NAME(S): Paraplatin What should I tell my health care provider before I take this medicine? They need to know if you have any of these conditions: -blood disorders -hearing problems -kidney disease -recent or ongoing radiation therapy -an unusual or allergic reaction to carboplatin, cisplatin, other chemotherapy, other medicines, foods, dyes, or preservatives -pregnant or trying to get pregnant -breast-feeding How should I use this medicine? This drug is usually given as an infusion into a vein. It is administered in a hospital or clinic by a specially trained health care professional. Talk to your pediatrician regarding the use of this medicine in children. Special care may be needed. Overdosage: If you think you have taken too much of this medicine contact a poison control center or emergency room at once. NOTE: This medicine is only for you. Do not share this medicine with others. What if I miss a dose? It is important not to miss a dose. Call your doctor or health care professional if you are unable to keep an appointment. What may interact with this medicine? -medicines for seizures -medicines to increase blood counts like filgrastim, pegfilgrastim, sargramostim -some antibiotics like amikacin, gentamicin, neomycin, streptomycin, tobramycin -vaccines Talk to  your doctor or health care professional before taking any of these medicines: -acetaminophen -aspirin -ibuprofen -ketoprofen -naproxen This list may not describe all possible interactions. Give your health care provider a list of all the medicines, herbs, non-prescription drugs, or dietary supplements you use. Also tell them if you smoke, drink alcohol, or use illegal drugs. Some items may interact with your medicine. What should I watch for while using this medicine? Your condition will be monitored carefully while you are receiving this medicine. You will need important blood work done while you are taking this medicine. This drug may make you feel generally unwell. This is not uncommon, as chemotherapy can affect healthy cells as well as cancer cells. Report any side effects. Continue your course of treatment even though you feel ill unless your doctor tells you to stop. In some cases, you may be given additional medicines to help with side effects. Follow all directions for their use. Call your doctor or health care professional for advice if you get a fever, chills or sore throat, or other symptoms of a cold or flu. Do not treat yourself. This drug decreases your body's ability to fight infections. Try to avoid being around people who are sick. This medicine may increase your risk to bruise or bleed. Call your doctor or health care professional if you notice any unusual bleeding. Be careful brushing and flossing your teeth or using a toothpick because you may get an infection or bleed more easily. If you have any dental work done, tell your  dentist you are receiving this medicine. Avoid taking products that contain aspirin, acetaminophen, ibuprofen, naproxen, or ketoprofen unless instructed by your doctor. These medicines may hide a fever. Do not become pregnant while taking this medicine. Women should inform their doctor if they wish to become pregnant or think they might be pregnant. There is a  potential for serious side effects to an unborn child. Talk to your health care professional or pharmacist for more information. Do not breast-feed an infant while taking this medicine. What side effects may I notice from receiving this medicine? Side effects that you should report to your doctor or health care professional as soon as possible: -allergic reactions like skin rash, itching or hives, swelling of the face, lips, or tongue -signs of infection - fever or chills, cough, sore throat, pain or difficulty passing urine -signs of decreased platelets or bleeding - bruising, pinpoint red spots on the skin, black, tarry stools, nosebleeds -signs of decreased red blood cells - unusually weak or tired, fainting spells, lightheadedness -breathing problems -changes in hearing -changes in vision -chest pain -high blood pressure -low blood counts - This drug may decrease the number of white blood cells, red blood cells and platelets. You may be at increased risk for infections and bleeding. -nausea and vomiting -pain, swelling, redness or irritation at the injection site -pain, tingling, numbness in the hands or feet -problems with balance, talking, walking -trouble passing urine or change in the amount of urine Side effects that usually do not require medical attention (report to your doctor or health care professional if they continue or are bothersome): -hair loss -loss of appetite -metallic taste in the mouth or changes in taste This list may not describe all possible side effects. Call your doctor for medical advice about side effects. You may report side effects to FDA at 1-800-FDA-1088. Where should I keep my medicine? This drug is given in a hospital or clinic and will not be stored at home. NOTE: This sheet is a summary. It may not cover all possible information. If you have questions about this medicine, talk to your doctor, pharmacist, or health care provider.  2018 Elsevier/Gold  Standard (2007-10-22 14:38:05)

## 2018-05-15 ENCOUNTER — Ambulatory Visit
Admission: RE | Admit: 2018-05-15 | Discharge: 2018-05-15 | Disposition: A | Discharge status: Home / Self Care | Payer: Self-pay | Source: Ambulatory Visit | Attending: Radiation Oncology | Admitting: Radiation Oncology

## 2018-05-16 ENCOUNTER — Ambulatory Visit
Admission: RE | Admit: 2018-05-16 | Discharge: 2018-05-16 | Disposition: A | Discharge status: Home / Self Care | Payer: Self-pay | Source: Ambulatory Visit | Attending: Radiation Oncology | Admitting: Radiation Oncology

## 2018-05-17 ENCOUNTER — Ambulatory Visit
Admission: RE | Admit: 2018-05-17 | Discharge: 2018-05-17 | Disposition: A | Discharge status: Home / Self Care | Payer: Self-pay | Source: Ambulatory Visit | Attending: Radiation Oncology | Admitting: Radiation Oncology

## 2018-05-20 ENCOUNTER — Inpatient Hospital Stay: Payer: Self-pay

## 2018-05-20 ENCOUNTER — Ambulatory Visit
Admission: RE | Admit: 2018-05-20 | Discharge: 2018-05-20 | Disposition: A | Discharge status: Home / Self Care | Payer: Self-pay | Source: Ambulatory Visit | Attending: Radiation Oncology | Admitting: Radiation Oncology

## 2018-05-20 ENCOUNTER — Other Ambulatory Visit: Payer: Self-pay | Admitting: *Deleted

## 2018-05-20 ENCOUNTER — Telehealth: Payer: Self-pay | Admitting: Internal Medicine

## 2018-05-20 VITALS — BP 87/56 | HR 101 | Temp 98.2°F | Resp 20

## 2018-05-20 DIAGNOSIS — D649 Anemia, unspecified: Secondary | ICD-10-CM

## 2018-05-20 DIAGNOSIS — C3491 Malignant neoplasm of unspecified part of right bronchus or lung: Secondary | ICD-10-CM

## 2018-05-20 LAB — CBC WITH DIFFERENTIAL (CANCER CENTER ONLY)
Abs Immature Granulocytes: 0.06 10*3/uL (ref 0.00–0.07)
Basophils Absolute: 0 10*3/uL (ref 0.0–0.1)
Basophils Relative: 1 %
Eosinophils Absolute: 0 10*3/uL (ref 0.0–0.5)
Eosinophils Relative: 0 %
HEMATOCRIT: 25.5 % — AB (ref 39.0–52.0)
Hemoglobin: 7.8 g/dL — ABNORMAL LOW (ref 13.0–17.0)
IMMATURE GRANULOCYTES: 1 %
LYMPHS ABS: 0.5 10*3/uL — AB (ref 0.7–4.0)
Lymphocytes Relative: 7 %
MCH: 26.2 pg (ref 26.0–34.0)
MCHC: 30.6 g/dL (ref 30.0–36.0)
MCV: 85.6 fL (ref 80.0–100.0)
Monocytes Absolute: 0.8 10*3/uL (ref 0.1–1.0)
Monocytes Relative: 12 %
NEUTROS ABS: 5.1 10*3/uL (ref 1.7–7.7)
NEUTROS PCT: 79 %
Platelet Count: 384 10*3/uL (ref 150–400)
RBC: 2.98 MIL/uL — ABNORMAL LOW (ref 4.22–5.81)
RDW: 17.4 % — ABNORMAL HIGH (ref 11.5–15.5)
WBC Count: 6.5 10*3/uL (ref 4.0–10.5)
nRBC: 0 % (ref 0.0–0.2)

## 2018-05-20 LAB — CMP (CANCER CENTER ONLY)
ALBUMIN: 1.9 g/dL — AB (ref 3.5–5.0)
ALK PHOS: 93 U/L (ref 38–126)
ALT: 30 U/L (ref 0–44)
ANION GAP: 10 (ref 5–15)
AST: 33 U/L (ref 15–41)
BUN: 14 mg/dL (ref 8–23)
CALCIUM: 8.9 mg/dL (ref 8.9–10.3)
CO2: 25 mmol/L (ref 22–32)
Chloride: 100 mmol/L (ref 98–111)
Creatinine: 0.65 mg/dL (ref 0.61–1.24)
GFR, Estimated: >60 mL/min (ref >60)
GLUCOSE: 125 mg/dL — AB (ref 70–99)
Potassium: 3.9 mmol/L (ref 3.5–5.1)
SODIUM: 135 mmol/L (ref 135–145)
Total Bilirubin: 0.4 mg/dL (ref 0.3–1.2)
Total Protein: 6.6 g/dL (ref 6.5–8.1)

## 2018-05-20 LAB — ABO/RH: ABO/RH(D): B POS

## 2018-05-20 LAB — PREPARE RBC (CROSSMATCH)

## 2018-05-20 MED ORDER — SODIUM CHLORIDE 0.9 % IV SOLN
45.0000 mg/m2 | Freq: Once | INTRAVENOUS | Status: AC
  Administered 2018-05-20: 66 mg via INTRAVENOUS
  Filled 2018-05-20: qty 11

## 2018-05-20 MED ORDER — SODIUM CHLORIDE 0.9 % IV SOLN
168.8000 mg | Freq: Once | INTRAVENOUS | Status: AC
  Administered 2018-05-20: 170 mg via INTRAVENOUS
  Filled 2018-05-20: qty 17

## 2018-05-20 MED ORDER — FAMOTIDINE IN NACL 20-0.9 MG/50ML-% IV SOLN
20.0000 mg | Freq: Once | INTRAVENOUS | Status: AC
  Administered 2018-05-20: 20 mg via INTRAVENOUS

## 2018-05-20 MED ORDER — DIPHENHYDRAMINE HCL 50 MG/ML IJ SOLN
INTRAMUSCULAR | Status: AC
  Filled 2018-05-20: qty 1

## 2018-05-20 MED ORDER — SODIUM CHLORIDE 0.9 % IV SOLN
Freq: Once | INTRAVENOUS | Status: AC
  Administered 2018-05-20: 10:00:00 via INTRAVENOUS
  Filled 2018-05-20: qty 250

## 2018-05-20 MED ORDER — PALONOSETRON HCL INJECTION 0.25 MG/5ML
INTRAVENOUS | Status: AC
  Filled 2018-05-20: qty 5

## 2018-05-20 MED ORDER — FAMOTIDINE IN NACL 20-0.9 MG/50ML-% IV SOLN
INTRAVENOUS | Status: AC
  Filled 2018-05-20: qty 50

## 2018-05-20 MED ORDER — PALONOSETRON HCL INJECTION 0.25 MG/5ML
0.2500 mg | Freq: Once | INTRAVENOUS | Status: AC
  Administered 2018-05-20: 0.25 mg via INTRAVENOUS

## 2018-05-20 MED ORDER — SODIUM CHLORIDE 0.9 % IV SOLN
20.0000 mg | Freq: Once | INTRAVENOUS | Status: AC
  Administered 2018-05-20: 20 mg via INTRAVENOUS
  Filled 2018-05-20: qty 2

## 2018-05-20 MED ORDER — DIPHENHYDRAMINE HCL 50 MG/ML IJ SOLN
50.0000 mg | Freq: Once | INTRAMUSCULAR | Status: AC
  Administered 2018-05-20: 50 mg via INTRAVENOUS

## 2018-05-20 NOTE — Telephone Encounter (Signed)
Spoke with patient re lab/PRBC's while in infusion. Patient will have PRBC's 10/23 and lab drawn today while in infusion - spoke with lab re going to infusion to draw lab.

## 2018-05-20 NOTE — Patient Instructions (Signed)
   [facility] Discharge Instructions for Patients Receiving Chemotherapy  Today you received the following chemotherapy agents Taxol and Carboplatin   To help prevent nausea and vomiting after your treatment, we encourage you to take your nausea medication as directed.    If you develop nausea and vomiting that is not controlled by your nausea medication, call the clinic.   BELOW ARE SYMPTOMS THAT SHOULD BE REPORTED IMMEDIATELY:  *FEVER GREATER THAN 100.5 F  *CHILLS WITH OR WITHOUT FEVER  NAUSEA AND VOMITING THAT IS NOT CONTROLLED WITH YOUR NAUSEA MEDICATION  *UNUSUAL SHORTNESS OF BREATH  *UNUSUAL BRUISING OR BLEEDING  TENDERNESS IN MOUTH AND THROAT WITH OR WITHOUT PRESENCE OF ULCERS  *URINARY PROBLEMS  *BOWEL PROBLEMS  UNUSUAL RASH Items with * indicate a potential emergency and should be followed up as soon as possible.  Feel free to call the clinic should you have any questions or concerns. The clinic phone number is [phone].  Please show the CHEMO ALERT CARD at check-in to the Emergency Department and triage nurse.   

## 2018-05-20 NOTE — Progress Notes (Signed)
Labs, blood pressure 87/56  and HR 101  reviewed wtih MD. VO "Ok to treat despite labs." Pt to return this week for blood transfusion. Message to scheduling for pt to come in this week for a  Lab and blood transfusion appt.

## 2018-05-21 ENCOUNTER — Encounter: Payer: Self-pay | Admitting: *Deleted

## 2018-05-21 ENCOUNTER — Ambulatory Visit
Admission: RE | Admit: 2018-05-21 | Discharge: 2018-05-21 | Disposition: A | Discharge status: Home / Self Care | Payer: Self-pay | Source: Ambulatory Visit | Attending: Radiation Oncology | Admitting: Radiation Oncology

## 2018-05-21 NOTE — Progress Notes (Signed)
Oncology Nurse Navigator Documentation  Oncology Nurse Navigator Flowsheets 05/21/2018  Navigator Location [facility]-  Navigator Encounter Type Other/I received a message that [employer] faxed a form for Warren Blankenship to completed. I called and they faxed to the wrong place.  I gave them the correct fax number.  Treatment Phase Treatment  Barriers/Navigation Needs Coordination of Care  Interventions Coordination of Care  Coordination of Care Other  Acuity Level 1  Time Spent with Patient 30

## 2018-05-22 ENCOUNTER — Inpatient Hospital Stay: Payer: Self-pay

## 2018-05-22 ENCOUNTER — Ambulatory Visit
Admission: RE | Admit: 2018-05-22 | Discharge: 2018-05-22 | Disposition: A | Discharge status: Home / Self Care | Payer: Self-pay | Source: Ambulatory Visit | Attending: Radiation Oncology | Admitting: Radiation Oncology

## 2018-05-22 DIAGNOSIS — D649 Anemia, unspecified: Secondary | ICD-10-CM

## 2018-05-22 MED ORDER — SODIUM CHLORIDE 0.9% IV SOLUTION
250.0000 mL | Freq: Once | INTRAVENOUS | Status: AC
  Administered 2018-05-22: 250 mL via INTRAVENOUS
  Filled 2018-05-22: qty 250

## 2018-05-22 MED ORDER — DIPHENHYDRAMINE HCL 25 MG PO CAPS
25.0000 mg | ORAL_CAPSULE | Freq: Once | ORAL | Status: AC
  Administered 2018-05-22: 25 mg via ORAL

## 2018-05-22 MED ORDER — METHYLPREDNISOLONE SODIUM SUCC 40 MG IJ SOLR
INTRAMUSCULAR | Status: AC
  Filled 2018-05-22: qty 1

## 2018-05-22 MED ORDER — DIPHENHYDRAMINE HCL 25 MG PO CAPS
ORAL_CAPSULE | ORAL | Status: AC
  Filled 2018-05-22: qty 1

## 2018-05-22 MED ORDER — METHYLPREDNISOLONE SODIUM SUCC 40 MG IJ SOLR
40.0000 mg | Freq: Once | INTRAMUSCULAR | Status: AC
  Administered 2018-05-22: 40 mg via INTRAVENOUS

## 2018-05-22 MED ORDER — ACETAMINOPHEN 325 MG PO TABS
650.0000 mg | ORAL_TABLET | Freq: Once | ORAL | Status: AC
  Administered 2018-05-22: 650 mg via ORAL

## 2018-05-22 MED ORDER — ACETAMINOPHEN 325 MG PO TABS
ORAL_TABLET | ORAL | Status: AC
  Filled 2018-05-22: qty 2

## 2018-05-22 NOTE — Patient Instructions (Signed)

## 2018-05-23 ENCOUNTER — Ambulatory Visit
Admission: RE | Admit: 2018-05-23 | Discharge: 2018-05-23 | Disposition: A | Discharge status: Home / Self Care | Payer: Self-pay | Source: Ambulatory Visit | Attending: Radiation Oncology | Admitting: Radiation Oncology

## 2018-05-23 LAB — TYPE AND SCREEN
ABO/RH(D): B POS
ANTIBODY SCREEN: NEGATIVE
Unit division: 0
Unit division: 0

## 2018-05-23 LAB — BPAM RBC
BLOOD PRODUCT EXPIRATION DATE: 201911102359
Blood Product Expiration Date: 201911102359
ISSUE DATE / TIME: 201910230906
ISSUE DATE / TIME: 201910230906
Unit Type and Rh: 7300
Unit Type and Rh: 7300

## 2018-05-24 ENCOUNTER — Ambulatory Visit
Admission: RE | Admit: 2018-05-24 | Discharge: 2018-05-24 | Disposition: A | Discharge status: Home / Self Care | Payer: Self-pay | Source: Ambulatory Visit | Attending: Radiation Oncology | Admitting: Radiation Oncology

## 2018-05-27 ENCOUNTER — Inpatient Hospital Stay: Payer: Self-pay

## 2018-05-27 ENCOUNTER — Inpatient Hospital Stay (HOSPITAL_BASED_OUTPATIENT_CLINIC_OR_DEPARTMENT_OTHER): Payer: Self-pay | Admitting: Internal Medicine

## 2018-05-27 ENCOUNTER — Ambulatory Visit
Admission: RE | Admit: 2018-05-27 | Discharge: 2018-05-27 | Disposition: A | Discharge status: Home / Self Care | Payer: Self-pay | Source: Ambulatory Visit | Attending: Radiation Oncology | Admitting: Radiation Oncology

## 2018-05-27 ENCOUNTER — Telehealth: Payer: Self-pay | Admitting: Internal Medicine

## 2018-05-27 ENCOUNTER — Encounter: Payer: Self-pay | Admitting: Internal Medicine

## 2018-05-27 VITALS — BP 90/60 | HR 96 | Temp 98.1°F | Resp 16 | Ht 67.0 in | Wt 102.1 lb

## 2018-05-27 DIAGNOSIS — C3491 Malignant neoplasm of unspecified part of right bronchus or lung: Secondary | ICD-10-CM

## 2018-05-27 DIAGNOSIS — D638 Anemia in other chronic diseases classified elsewhere: Secondary | ICD-10-CM

## 2018-05-27 DIAGNOSIS — C3411 Malignant neoplasm of upper lobe, right bronchus or lung: Secondary | ICD-10-CM

## 2018-05-27 DIAGNOSIS — F17200 Nicotine dependence, unspecified, uncomplicated: Secondary | ICD-10-CM

## 2018-05-27 LAB — CMP (CANCER CENTER ONLY)
ALT: 21 U/L (ref 0–44)
AST: 22 U/L (ref 15–41)
Albumin: 2 g/dL — ABNORMAL LOW (ref 3.5–5.0)
Alkaline Phosphatase: 88 U/L (ref 38–126)
Anion gap: 9 (ref 5–15)
BUN: 14 mg/dL (ref 8–23)
CHLORIDE: 97 mmol/L — AB (ref 98–111)
CO2: 28 mmol/L (ref 22–32)
Calcium: 9.1 mg/dL (ref 8.9–10.3)
Creatinine: 0.64 mg/dL (ref 0.61–1.24)
Glucose, Bld: 118 mg/dL — ABNORMAL HIGH (ref 70–99)
Potassium: 3.7 mmol/L (ref 3.5–5.1)
Sodium: 134 mmol/L — ABNORMAL LOW (ref 135–145)
Total Bilirubin: 0.7 mg/dL (ref 0.3–1.2)
Total Protein: 6.6 g/dL (ref 6.5–8.1)

## 2018-05-27 LAB — CBC WITH DIFFERENTIAL (CANCER CENTER ONLY)
ABS IMMATURE GRANULOCYTES: 0.03 10*3/uL (ref 0.00–0.07)
BASOS ABS: 0 10*3/uL (ref 0.0–0.1)
Basophils Relative: 0 %
Eosinophils Absolute: 0 10*3/uL (ref 0.0–0.5)
Eosinophils Relative: 1 %
HCT: 33 % — ABNORMAL LOW (ref 39.0–52.0)
HEMOGLOBIN: 10.3 g/dL — AB (ref 13.0–17.0)
IMMATURE GRANULOCYTES: 1 %
LYMPHS PCT: 9 %
Lymphs Abs: 0.5 10*3/uL — ABNORMAL LOW (ref 0.7–4.0)
MCH: 27.6 pg (ref 26.0–34.0)
MCHC: 31.2 g/dL (ref 30.0–36.0)
MCV: 88.5 fL (ref 80.0–100.0)
Monocytes Absolute: 0.7 10*3/uL (ref 0.1–1.0)
Monocytes Relative: 12 %
NEUTROS ABS: 4.7 10*3/uL (ref 1.7–7.7)
NEUTROS PCT: 77 %
NRBC: 0 % (ref 0.0–0.2)
Platelet Count: 313 10*3/uL (ref 150–400)
RBC: 3.73 MIL/uL — AB (ref 4.22–5.81)
RDW: 18.2 % — ABNORMAL HIGH (ref 11.5–15.5)
WBC: 6 10*3/uL (ref 4.0–10.5)

## 2018-05-27 MED ORDER — SODIUM CHLORIDE 0.9 % IV SOLN
20.0000 mg | Freq: Once | INTRAVENOUS | Status: AC
  Administered 2018-05-27: 20 mg via INTRAVENOUS
  Filled 2018-05-27: qty 2

## 2018-05-27 MED ORDER — PALONOSETRON HCL INJECTION 0.25 MG/5ML
INTRAVENOUS | Status: AC
  Filled 2018-05-27: qty 5

## 2018-05-27 MED ORDER — SODIUM CHLORIDE 0.9 % IV SOLN
Freq: Once | INTRAVENOUS | Status: AC
  Administered 2018-05-27: 12:00:00 via INTRAVENOUS
  Filled 2018-05-27: qty 250

## 2018-05-27 MED ORDER — SODIUM CHLORIDE 0.9 % IV SOLN
45.0000 mg/m2 | Freq: Once | INTRAVENOUS | Status: AC
  Administered 2018-05-27: 66 mg via INTRAVENOUS
  Filled 2018-05-27: qty 11

## 2018-05-27 MED ORDER — DIPHENHYDRAMINE HCL 50 MG/ML IJ SOLN
INTRAMUSCULAR | Status: AC
  Filled 2018-05-27: qty 1

## 2018-05-27 MED ORDER — FAMOTIDINE IN NACL 20-0.9 MG/50ML-% IV SOLN
20.0000 mg | Freq: Once | INTRAVENOUS | Status: AC
  Administered 2018-05-27: 20 mg via INTRAVENOUS

## 2018-05-27 MED ORDER — PALONOSETRON HCL INJECTION 0.25 MG/5ML
0.2500 mg | Freq: Once | INTRAVENOUS | Status: AC
  Administered 2018-05-27: 0.25 mg via INTRAVENOUS

## 2018-05-27 MED ORDER — DIPHENHYDRAMINE HCL 50 MG/ML IJ SOLN
50.0000 mg | Freq: Once | INTRAMUSCULAR | Status: AC
  Administered 2018-05-27: 50 mg via INTRAVENOUS

## 2018-05-27 MED ORDER — FAMOTIDINE IN NACL 20-0.9 MG/50ML-% IV SOLN
INTRAVENOUS | Status: AC
  Filled 2018-05-27: qty 50

## 2018-05-27 MED ORDER — SODIUM CHLORIDE 0.9 % IV SOLN
168.8000 mg | Freq: Once | INTRAVENOUS | Status: AC
  Administered 2018-05-27: 170 mg via INTRAVENOUS
  Filled 2018-05-27: qty 17

## 2018-05-27 NOTE — Telephone Encounter (Signed)
Pt sched with kc due to no avail on MM sched 11/25 sch message. Gave pt avs and calendar

## 2018-05-27 NOTE — Patient Instructions (Signed)
[facility] Discharge Instructions for Patients Receiving Chemotherapy  Today you received the following chemotherapy agents Taxol, Carboplatin  To help prevent nausea and vomiting after your treatment, we encourage you to take your nausea medication as directed  If you develop nausea and vomiting that is not controlled by your nausea medication, call the clinic.   BELOW ARE SYMPTOMS THAT SHOULD BE REPORTED IMMEDIATELY:  *FEVER GREATER THAN 100.5 F  *CHILLS WITH OR WITHOUT FEVER  NAUSEA AND VOMITING THAT IS NOT CONTROLLED WITH YOUR NAUSEA MEDICATION  *UNUSUAL SHORTNESS OF BREATH  *UNUSUAL BRUISING OR BLEEDING  TENDERNESS IN MOUTH AND THROAT WITH OR WITHOUT PRESENCE OF ULCERS  *URINARY PROBLEMS  *BOWEL PROBLEMS  UNUSUAL RASH Items with * indicate a potential emergency and should be followed up as soon as possible.  Feel free to call the clinic should you have any questions or concerns. The clinic phone number is [phone].  Please show the CHEMO ALERT CARD at check-in to the Emergency Department and triage nurse.   

## 2018-05-27 NOTE — Addendum Note (Signed)
Addended by: Ardeen Garland on: 05/27/2018 12:18 PM   Modules accepted: Orders

## 2018-05-27 NOTE — Progress Notes (Signed)
[place] [phone]   [phone]  OFFICE PROGRESS NOTE  Patient, No Pcp Per No address on file  DIAGNOSIS:  Stage IIIB (T4, N2, M0)) non-small cell lung cancer, poorly differentiated carcinoma favoring adenocarcinoma presented with large right hilar and upper lobe lung mass with right hilar and mediastinal lymphadenopathy and compression of right mainstem bronchus as well as right middle lobe bronchus diagnosed in September 2019.  PRIOR THERAPY: None.  CURRENT THERAPY: Concurrent chemoradiation with weekly carboplatin for AUC of 2 and paclitaxel 45 MG/M2.  First dose May 14, 2018.  Status post 2 cycles.  INTERVAL HISTORY: Warren Blankenship 62 y.o. male returns to the clinic today for follow-up visit accompanied by his son.  The patient is feeling fine today with no concerning complaints.  He is tolerating this course of concurrent chemoradiation fairly well.  He denied having any chest pain, shortness breath, cough or hemoptysis.  He has no nausea, vomiting, diarrhea or constipation.  He started feeling some pain and redness at the radiation port.  The patient is here today for evaluation before starting cycle #3.  He felt much better after receiving 2 units of PRBCs transfusion recently.  MEDICAL HISTORY: Past Medical History:  Diagnosis Date  . Mass of right lung   . Tobacco abuse     ALLERGIES:  has No Known Allergies.  MEDICATIONS:  Current Outpatient Medications  Medication Sig Dispense Refill  . acetaminophen (TYLENOL) 325 MG tablet Take 2 tablets (650 mg total) by mouth every 6 (six) hours as needed for mild pain (or Fever >/= 101).    . lactose free nutrition (BOOST PLUS) LIQD Take 237 mLs by mouth 3 (three) times daily between meals. 90 Can 0  . magnesium oxide (MAG-OX) 400 (241.3 Mg) MG tablet Take 1 tablet (400 mg total) by mouth daily. 30 tablet   . Multiple Vitamin (MULTIVITAMIN WITH MINERALS) TABS tablet Take 1 tablet by  mouth daily.    . nicotine (NICODERM CQ - DOSED IN MG/24 HR) 7 mg/24hr patch Place 1 patch (7 mg total) onto the skin daily. 28 patch 0  . polyethylene glycol (MIRALAX / GLYCOLAX) packet Take 17 g by mouth daily as needed for mild constipation. 14 each 0  . prochlorperazine (COMPAZINE) 10 MG tablet Take 1 tablet (10 mg total) by mouth every 6 (six) hours as needed for nausea or vomiting. 30 tablet 0   No current facility-administered medications for this visit.     SURGICAL HISTORY:  Past Surgical History:  Procedure Laterality Date  . BIOPSY  04/29/2018   Procedure: BIOPSY;  Surgeon: Garner Nash, DO;  Location: [facility];  Service: Cardiopulmonary;;  . BRONCHIAL WASHINGS  04/29/2018   Procedure: BRONCHIAL WASHINGS;  Surgeon: Garner Nash, DO;  Location: [facility];  Service: Cardiopulmonary;;  . ENDOBRONCHIAL ULTRASOUND Bilateral 04/29/2018   Procedure: ENDOBRONCHIAL ULTRASOUND;  Surgeon: Garner Nash, DO;  Location: [facility];  Service: Cardiopulmonary;  Laterality: Bilateral;  . Endobronchial Ultrasound (EBUS), Mediastinum  Bilateral 04/29/2018   Dr. Valeta Harms   . FINE NEEDLE ASPIRATION BIOPSY  04/29/2018   Procedure: FINE NEEDLE ASPIRATION BIOPSY;  Surgeon: Garner Nash, DO;  Location: [facility];  Service: Cardiopulmonary;;  . FLEXIBLE BRONCHOSCOPY  04/29/2018   Procedure: FLEXIBLE BRONCHOSCOPY;  Surgeon: Garner Nash, DO;  Location: [facility];  Service: Cardiopulmonary;;  . TONSILLECTOMY      REVIEW OF SYSTEMS:  A comprehensive review of systems was negative except  for: Constitutional: positive for fatigue   PHYSICAL EXAMINATION: General appearance: alert, cooperative, fatigued and no distress Head: Normocephalic, without obvious abnormality, atraumatic Neck: no adenopathy, no JVD, supple, symmetrical, trachea midline and thyroid not enlarged, symmetric, no tenderness/mass/nodules Lymph nodes: Cervical, supraclavicular, and axillary nodes normal. Resp:  diminished breath sounds RUL and dullness to percussion RUL Back: symmetric, no curvature. ROM normal. No CVA tenderness. Cardio: regular rate and rhythm, S1, S2 normal, no murmur, click, rub or gallop GI: soft, non-tender; bowel sounds normal; no masses,  no organomegaly Extremities: extremities normal, atraumatic, no cyanosis or edema  ECOG PERFORMANCE STATUS: 1 - Symptomatic but completely ambulatory  Blood pressure 90/60, pulse 96, temperature 98.1 F (36.7 C), temperature source Oral, resp. rate 16, height _0  (1.702 m), weight 102 lb 1.6 oz (46.3 kg), SpO2 97 %.  LABORATORY DATA: Lab Results  Component Value Date   WBC 6.0 05/27/2018   HGB 10.3 (L) 05/27/2018   HCT 33.0 (L) 05/27/2018   MCV 88.5 05/27/2018   PLT 313 05/27/2018      Chemistry      Component Value Date/Time   NA 135 05/20/2018 0903   K 3.9 05/20/2018 0903   CL 100 05/20/2018 0903   CO2 25 05/20/2018 0903   BUN 14 05/20/2018 0903   CREATININE 0.65 05/20/2018 0903      Component Value Date/Time   CALCIUM 8.9 05/20/2018 0903   ALKPHOS 93 05/20/2018 0903   AST 33 05/20/2018 0903   ALT 30 05/20/2018 0903   BILITOT 0.4 05/20/2018 0903       RADIOGRAPHIC STUDIES: Nm Pet Image Initial (pi) Skull Base To Thigh  Result Date: 05/10/2018 CLINICAL DATA:  Initial treatment strategy for right lung mass and mediastinal adenopathy. EXAM: NUCLEAR MEDICINE PET SKULL BASE TO THIGH TECHNIQUE: 4.9 mCi F-18 FDG was injected intravenously. Full-ring PET imaging was performed from the skull base to thigh after the radiotracer. CT data was obtained and used for attenuation correction and anatomic localization. Fasting blood glucose: 74 mg/dl COMPARISON:  CT chest 04/12/2018 FINDINGS: Mediastinal blood pool activity: SUV max 1.56 NECK: No hypermetabolic lymph nodes in the neck. Incidental CT findings: Bilateral carotid artery calcifications. CHEST: Large right hilar and mediastinal mass is markedly hypermetabolic with SUV max  of 37.19. I do not see the right upper lobe bronchus at all and I think part of the right upper lobe is collapsed against the hilum and mediastinum. Interstitial thickening and nodularity peripheral to the mass is worrisome for interstitial spread of tumor and possible endobronchial spread of tumor. I do not see any definite metastatic pulmonary nodules in the left lung. No contralateral mediastinal or hilar adenopathy. No supraclavicular or axillary lymphadenopathy. Incidental CT findings: none ABDOMEN/PELVIS: No abnormal hypermetabolic activity within the liver, pancreas, adrenal glands, or spleen. No hypermetabolic lymph nodes in the abdomen or pelvis. There is marked hypermetabolism (SUV max 7.52) in the anorectal region. This could be due to internal hemorrhoids but recommend correlation with digital rectal examination. Incidental CT findings: Advanced atherosclerotic calcifications involving the aorta and iliac arteries. SKELETON: No focal hypermetabolic activity to suggest skeletal metastasis. Incidental CT findings: none IMPRESSION: 1. Probable large medial right upper lobe mass invading the hilum and mediastinum which demonstrates marked hypermetabolism and is consistent with neoplasm. Tumor is occluding the right upper lobe bronchus. Recommend bronchoscopic biopsy. 2. Interstitial thickening and nodularity in the lung surrounding the mass is worrisome for interstitial spread of tumor. 3. No findings for metastatic disease involving the neck,  abdomen/pelvis or bony structures. 4. Hypermetabolism in the anorectal region probably due to hemorrhoids but recommend physical examination. Electronically Signed   By: Marijo Sanes M.D.   On: 05/10/2018 17:03    ASSESSMENT AND PLAN: This is a very pleasant 62 years old white male recently diagnosed with a stage IIIb non-small cell lung cancer.  The patient is here today for evaluation before starting the first cycle of concurrent chemotherapy.  It was recommended  for the patient to proceed with a course of concurrent chemoradiation with weekly carboplatin for AUC of 2 and paclitaxel 45 MG/M2.  He started radiation last week.  The patient is tolerating this treatment well with no concerning complaints. I recommended for him to proceed with cycle #3 today as scheduled. I will see him back for follow-up visit in 2 weeks for evaluation before starting cycle #5. He was advised to call immediately if he has any concerning symptoms in the interval. The patient voices understanding of current disease status and treatment options and is in agreement with the current care plan.  All questions were answered. The patient knows to call the clinic with any problems, questions or concerns. We can certainly see the patient much sooner if necessary.  Disclaimer: This note was dictated with voice recognition software. Similar sounding words can inadvertently be transcribed and may not be corrected upon review.

## 2018-05-27 NOTE — Patient Instructions (Signed)
Steps to Quit Smoking Smoking tobacco can be bad for your health. It can also affect almost every organ in your body. Smoking puts you and people around you at risk for many serious long-lasting (chronic) diseases. Quitting smoking is hard, but it is one of the best things that you can do for your health. It is never too late to quit. What are the benefits of quitting smoking? When you quit smoking, you lower your risk for getting serious diseases and conditions. They can include:  Lung cancer or lung disease.  Heart disease.  Stroke.  Heart attack.  Not being able to have children (infertility).  Weak bones (osteoporosis) and broken bones (fractures).  If you have coughing, wheezing, and shortness of breath, those symptoms may get better when you quit. You may also get sick less often. If you are pregnant, quitting smoking can help to lower your chances of having a baby of low birth weight. What can I do to help me quit smoking? Talk with your doctor about what can help you quit smoking. Some things you can do (strategies) include:  Quitting smoking totally, instead of slowly cutting back how much you smoke over a period of time.  Going to in-person counseling. You are more likely to quit if you go to many counseling sessions.  Using resources and support systems, such as: ? Online chats with a counselor. ? Phone quitlines. ? Printed self-help materials. ? Support groups or group counseling. ? Text messaging programs. ? Mobile phone apps or applications.  Taking medicines. Some of these medicines may have nicotine in them. If you are pregnant or breastfeeding, do not take any medicines to quit smoking unless your doctor says it is okay. Talk with your doctor about counseling or other things that can help you.  Talk with your doctor about using more than one strategy at the same time, such as taking medicines while you are also going to in-person counseling. This can help make  quitting easier. What things can I do to make it easier to quit? Quitting smoking might feel very hard at first, but there is a lot that you can do to make it easier. Take these steps:  Talk to your family and friends. Ask them to support and encourage you.  Call phone quitlines, reach out to support groups, or work with a counselor.  Ask people who smoke to not smoke around you.  Avoid places that make you want (trigger) to smoke, such as: ? Bars. ? Parties. ? Smoke-break areas at work.  Spend time with people who do not smoke.  Lower the stress in your life. Stress can make you want to smoke. Try these things to help your stress: ? Getting regular exercise. ? Deep-breathing exercises. ? Yoga. ? Meditating. ? Doing a body scan. To do this, close your eyes, focus on one area of your body at a time from head to toe, and notice which parts of your body are tense. Try to relax the muscles in those areas.  Download or buy apps on your mobile phone or tablet that can help you stick to your quit plan. There are many free apps, such as QuitGuide from the CDC (Centers for Disease Control and Prevention). You can find more support from smokefree.gov and other websites.  This information is not intended to replace advice given to you by your health care provider. Make sure you discuss any questions you have with your health care provider. Document Released: 05/13/2009 Document   Revised: 03/14/2016 Document Reviewed: 12/01/2014 Elsevier Interactive Patient Education  2018 [employer]vier Inc.  

## 2018-05-28 ENCOUNTER — Ambulatory Visit
Admission: RE | Admit: 2018-05-28 | Discharge: 2018-05-28 | Disposition: A | Discharge status: Home / Self Care | Payer: Self-pay | Source: Ambulatory Visit | Attending: Radiation Oncology | Admitting: Radiation Oncology

## 2018-05-29 ENCOUNTER — Ambulatory Visit
Admission: RE | Admit: 2018-05-29 | Discharge: 2018-05-29 | Disposition: A | Discharge status: Home / Self Care | Payer: Self-pay | Source: Ambulatory Visit | Attending: Radiation Oncology | Admitting: Radiation Oncology

## 2018-05-30 ENCOUNTER — Ambulatory Visit
Admission: RE | Admit: 2018-05-30 | Discharge: 2018-05-30 | Disposition: A | Discharge status: Home / Self Care | Payer: Self-pay | Source: Ambulatory Visit | Attending: Radiation Oncology | Admitting: Radiation Oncology

## 2018-05-31 ENCOUNTER — Ambulatory Visit
Admission: RE | Admit: 2018-05-31 | Discharge: 2018-05-31 | Disposition: A | Discharge status: Home / Self Care | Payer: Self-pay | Source: Ambulatory Visit | Attending: Radiation Oncology | Admitting: Radiation Oncology

## 2018-05-31 DIAGNOSIS — C3401 Malignant neoplasm of right main bronchus: Secondary | ICD-10-CM | POA: Insufficient documentation

## 2018-05-31 DIAGNOSIS — Z720 Tobacco use: Secondary | ICD-10-CM | POA: Insufficient documentation

## 2018-05-31 DIAGNOSIS — Z510 Encounter for antineoplastic radiation therapy: Secondary | ICD-10-CM | POA: Insufficient documentation

## 2018-06-03 ENCOUNTER — Inpatient Hospital Stay: Payer: Self-pay | Attending: Internal Medicine

## 2018-06-03 ENCOUNTER — Inpatient Hospital Stay: Payer: Self-pay

## 2018-06-03 ENCOUNTER — Ambulatory Visit (HOSPITAL_BASED_OUTPATIENT_CLINIC_OR_DEPARTMENT_OTHER): Payer: Self-pay | Admitting: Medical

## 2018-06-03 ENCOUNTER — Ambulatory Visit
Admission: RE | Admit: 2018-06-03 | Discharge: 2018-06-03 | Disposition: A | Discharge status: Home / Self Care | Payer: Self-pay | Source: Ambulatory Visit | Attending: Radiation Oncology | Admitting: Radiation Oncology

## 2018-06-03 VITALS — BP 102/53 | HR 98 | Temp 98.1°F | Resp 16

## 2018-06-03 DIAGNOSIS — C3491 Malignant neoplasm of unspecified part of right bronchus or lung: Secondary | ICD-10-CM

## 2018-06-03 DIAGNOSIS — R609 Edema, unspecified: Secondary | ICD-10-CM

## 2018-06-03 DIAGNOSIS — C3411 Malignant neoplasm of upper lobe, right bronchus or lung: Secondary | ICD-10-CM | POA: Insufficient documentation

## 2018-06-03 DIAGNOSIS — Z720 Tobacco use: Secondary | ICD-10-CM | POA: Insufficient documentation

## 2018-06-03 DIAGNOSIS — E8809 Other disorders of plasma-protein metabolism, not elsewhere classified: Secondary | ICD-10-CM

## 2018-06-03 DIAGNOSIS — Z5111 Encounter for antineoplastic chemotherapy: Secondary | ICD-10-CM | POA: Insufficient documentation

## 2018-06-03 LAB — CBC WITH DIFFERENTIAL (CANCER CENTER ONLY)
Abs Immature Granulocytes: 0.02 10*3/uL (ref 0.00–0.07)
BASOS ABS: 0 10*3/uL (ref 0.0–0.1)
Basophils Relative: 0 %
EOS PCT: 0 %
Eosinophils Absolute: 0 10*3/uL (ref 0.0–0.5)
HEMATOCRIT: 32.1 % — AB (ref 39.0–52.0)
HEMOGLOBIN: 9.9 g/dL — AB (ref 13.0–17.0)
Immature Granulocytes: 0 %
LYMPHS ABS: 0.3 10*3/uL — AB (ref 0.7–4.0)
LYMPHS PCT: 7 %
MCH: 27.3 pg (ref 26.0–34.0)
MCHC: 30.8 g/dL (ref 30.0–36.0)
MCV: 88.4 fL (ref 80.0–100.0)
MONO ABS: 0.7 10*3/uL (ref 0.1–1.0)
Monocytes Relative: 16 %
Neutro Abs: 3.6 10*3/uL (ref 1.7–7.7)
Neutrophils Relative %: 77 %
Platelet Count: 323 10*3/uL (ref 150–400)
RBC: 3.63 MIL/uL — ABNORMAL LOW (ref 4.22–5.81)
RDW: 18.8 % — ABNORMAL HIGH (ref 11.5–15.5)
WBC Count: 4.6 10*3/uL (ref 4.0–10.5)
nRBC: 0 % (ref 0.0–0.2)

## 2018-06-03 LAB — CMP (CANCER CENTER ONLY)
ALBUMIN: 2 g/dL — AB (ref 3.5–5.0)
ALK PHOS: 82 U/L (ref 38–126)
ALT: 21 U/L (ref 0–44)
ANION GAP: 10 (ref 5–15)
AST: 21 U/L (ref 15–41)
BILIRUBIN TOTAL: 0.5 mg/dL (ref 0.3–1.2)
BUN: 13 mg/dL (ref 8–23)
CALCIUM: 9.1 mg/dL (ref 8.9–10.3)
CO2: 26 mmol/L (ref 22–32)
Chloride: 99 mmol/L (ref 98–111)
Creatinine: 0.61 mg/dL (ref 0.61–1.24)
GFR, Est AFR Am: >60 mL/min (ref >60)
GFR, Estimated: >60 mL/min (ref >60)
GLUCOSE: 76 mg/dL (ref 70–99)
Potassium: 3.7 mmol/L (ref 3.5–5.1)
Sodium: 135 mmol/L (ref 135–145)
TOTAL PROTEIN: 6.4 g/dL — AB (ref 6.5–8.1)

## 2018-06-03 MED ORDER — PALONOSETRON HCL INJECTION 0.25 MG/5ML
INTRAVENOUS | Status: AC
  Filled 2018-06-03: qty 5

## 2018-06-03 MED ORDER — FAMOTIDINE IN NACL 20-0.9 MG/50ML-% IV SOLN: 20.0000 mg | Freq: Once | INTRAVENOUS | Status: DC

## 2018-06-03 MED ORDER — SODIUM CHLORIDE 0.9 % IV SOLN
168.8000 mg | Freq: Once | INTRAVENOUS | Status: AC
  Administered 2018-06-03: 170 mg via INTRAVENOUS
  Filled 2018-06-03: qty 17

## 2018-06-03 MED ORDER — SODIUM CHLORIDE 0.9 % IV SOLN
45.0000 mg/m2 | Freq: Once | INTRAVENOUS | Status: AC
  Administered 2018-06-03: 66 mg via INTRAVENOUS
  Filled 2018-06-03: qty 11

## 2018-06-03 MED ORDER — SODIUM CHLORIDE 0.9 % IV SOLN
20.0000 mg | Freq: Once | INTRAVENOUS | Status: AC
  Administered 2018-06-03: 20 mg via INTRAVENOUS
  Filled 2018-06-03: qty 2

## 2018-06-03 MED ORDER — SODIUM CHLORIDE 0.9 % IV SOLN
Freq: Once | INTRAVENOUS | Status: AC
  Administered 2018-06-03: 10:00:00 via INTRAVENOUS
  Filled 2018-06-03: qty 250

## 2018-06-03 MED ORDER — PALONOSETRON HCL INJECTION 0.25 MG/5ML
0.2500 mg | Freq: Once | INTRAVENOUS | Status: AC
  Administered 2018-06-03: 0.25 mg via INTRAVENOUS

## 2018-06-03 MED ORDER — DIPHENHYDRAMINE HCL 50 MG/ML IJ SOLN
INTRAMUSCULAR | Status: AC
  Filled 2018-06-03: qty 1

## 2018-06-03 MED ORDER — DIPHENHYDRAMINE HCL 50 MG/ML IJ SOLN
50.0000 mg | Freq: Once | INTRAMUSCULAR | Status: AC
  Administered 2018-06-03: 50 mg via INTRAVENOUS

## 2018-06-03 NOTE — Patient Instructions (Signed)
   [facility] Discharge Instructions for Patients Receiving Chemotherapy  Today you received the following chemotherapy agents Taxol and Carboplatin   To help prevent nausea and vomiting after your treatment, we encourage you to take your nausea medication as directed.    If you develop nausea and vomiting that is not controlled by your nausea medication, call the clinic.   BELOW ARE SYMPTOMS THAT SHOULD BE REPORTED IMMEDIATELY:  *FEVER GREATER THAN 100.5 F  *CHILLS WITH OR WITHOUT FEVER  NAUSEA AND VOMITING THAT IS NOT CONTROLLED WITH YOUR NAUSEA MEDICATION  *UNUSUAL SHORTNESS OF BREATH  *UNUSUAL BRUISING OR BLEEDING  TENDERNESS IN MOUTH AND THROAT WITH OR WITHOUT PRESENCE OF ULCERS  *URINARY PROBLEMS  *BOWEL PROBLEMS  UNUSUAL RASH Items with * indicate a potential emergency and should be followed up as soon as possible.  Feel free to call the clinic should you have any questions or concerns. The clinic phone number is [phone].  Please show the CHEMO ALERT CARD at check-in to the Emergency Department and triage nurse.   

## 2018-06-03 NOTE — Progress Notes (Signed)
Synopsis: Hospital follow up, s/p bronchoscopy, Stage IIIa NSCLC.  Subjective:   PATIENT ID: Warren Blankenship GENDER: male DOB: [birth year], MRN: [id number]  Chief Complaint  Patient presents with  . Follow-up    States he has coughed up blood daily since bronchoscopy. States this started after and it is a small amount.     62 year old male with a diagnosis of stage IIIb non-small cell lung cancer.  Followed by Dr. Earlie Server in the oncology department.  Patient has completed now 4 cycles of chemotherapy as well as external beam radiation for concomitant chemo XRT.  Patient has been tolerating this very well.  He has gained at least 5 pounds.  He has been trying to maintain his weight and functional status.  He does not have any hemoptysis.  He does have some shortness of breath with significant exertion.  He is able to ambulate with a walker.  He is present today in office with his son.  He has been living with his daughter and son has been helping take care of him.  He does have occasional cough that is nonproductive.   Past Medical History:  Diagnosis Date  . Mass of right lung   . Tobacco abuse      Family History  Problem Relation Age of Onset  . Lung disease Father      Past Surgical History:  Procedure Laterality Date  . BIOPSY  04/29/2018   Procedure: BIOPSY;  Surgeon: Garner Nash, DO;  Location: [facility];  Service: Cardiopulmonary;;  . BRONCHIAL WASHINGS  04/29/2018   Procedure: BRONCHIAL WASHINGS;  Surgeon: Garner Nash, DO;  Location: [facility];  Service: Cardiopulmonary;;  . ENDOBRONCHIAL ULTRASOUND Bilateral 04/29/2018   Procedure: ENDOBRONCHIAL ULTRASOUND;  Surgeon: Garner Nash, DO;  Location: [facility];  Service: Cardiopulmonary;  Laterality: Bilateral;  . Endobronchial Ultrasound (EBUS), Mediastinum  Bilateral 04/29/2018   Dr. Valeta Harms   . FINE NEEDLE ASPIRATION BIOPSY  04/29/2018   Procedure: FINE NEEDLE ASPIRATION BIOPSY;  Surgeon: Garner Nash, DO;  Location: [facility];  Service: Cardiopulmonary;;  . FLEXIBLE BRONCHOSCOPY  04/29/2018   Procedure: FLEXIBLE BRONCHOSCOPY;  Surgeon: Garner Nash, DO;  Location: [facility];  Service: Cardiopulmonary;;  . TONSILLECTOMY      Social History   Socioeconomic History  . Marital status: Divorced    Spouse name: Not on file  . Number of children: Not on file  . Years of education: Not on file  . Highest education level: Not on file  Occupational History  . Not on file  Social Needs  . Financial resource strain: Very hard  . Food insecurity:    Worry: Often true    Inability: Often true  . Transportation needs:    Medical: Yes    Non-medical: Yes  Tobacco Use  . Smoking status: Current Every Day Smoker    Packs/day: 1.00  . Smokeless tobacco: Never Used  . Tobacco comment: 10 cigarettes per day 11.5.19 BB LPN  Substance and Sexual Activity  . Alcohol use: Yes    Comment: Former   . Drug use: Not Currently  . Sexual activity: Not Currently  Lifestyle  . Physical activity:    Days per week: Not on file    Minutes per session: Not on file  . Stress: Not on file  Relationships  . Social connections:    Talks on phone: Not on file    Gets together: Not on file    Attends religious service:  Not on file    Active member of club or organization: Not on file    Attends meetings of clubs or organizations: Not on file    Relationship status: Not on file  . Intimate partner violence:    Fear of current or ex partner: Not on file    Emotionally abused: Not on file    Physically abused: Not on file    Forced sexual activity: Not on file  Other Topics Concern  . Not on file  Social History Narrative   Currently resides in an apartment alone.    Friend states that he has not bathed since last hospitalization.    Friend states that trash, decaying food, animals, bugs, roaches are all living within the home.         No Known Allergies   Outpatient Medications Prior  to Visit  Medication Sig Dispense Refill  . acetaminophen (TYLENOL) 325 MG tablet Take 2 tablets (650 mg total) by mouth every 6 (six) hours as needed for mild pain (or Fever >/= 101).    . lactose free nutrition (BOOST PLUS) LIQD Take 237 mLs by mouth 3 (three) times daily between meals. 90 Can 0  . Multiple Vitamin (MULTIVITAMIN WITH MINERALS) TABS tablet Take 1 tablet by mouth daily.    . polyethylene glycol (MIRALAX / GLYCOLAX) packet Take 17 g by mouth daily as needed for mild constipation. 14 each 0  . prochlorperazine (COMPAZINE) 10 MG tablet Take 1 tablet (10 mg total) by mouth every 6 (six) hours as needed for nausea or vomiting. 30 tablet 0  . nicotine (NICODERM CQ - DOSED IN MG/24 HR) 7 mg/24hr patch Place 1 patch (7 mg total) onto the skin daily. (Patient not taking: Reported on 06/04/2018) 28 patch 0   No facility-administered medications prior to visit.     Review of Systems  Constitutional: Positive for malaise/fatigue and weight loss. Negative for chills and fever.  HENT: Negative for hearing loss, sore throat and tinnitus.   Eyes: Negative for blurred vision and double vision.  Respiratory: Positive for shortness of breath. Negative for cough, hemoptysis, sputum production, wheezing and stridor.   Cardiovascular: Negative for chest pain, palpitations, orthopnea, leg swelling and PND.  Gastrointestinal: Negative for abdominal pain, constipation, diarrhea, heartburn, nausea and vomiting.  Genitourinary: Negative for dysuria, hematuria and urgency.  Musculoskeletal: Negative for joint pain and myalgias.  Skin: Negative for itching and rash.  Neurological: Negative for dizziness, tingling, weakness and headaches.  Endo/Heme/Allergies: Negative for environmental allergies. Does not bruise/bleed easily.  Psychiatric/Behavioral: Negative for depression. The patient is not nervous/anxious and does not have insomnia.   All other systems reviewed and are negative.    Objective:    Physical Exam  Constitutional: He is oriented to person, place, and time. He appears well-developed. No distress.  Severe cachexia  HENT:  Head: Normocephalic and atraumatic.  Mouth/Throat: Oropharynx is clear and moist. No oropharyngeal exudate.  Eyes: Pupils are equal, round, and reactive to light. Conjunctivae and EOM are normal.  Neck: No JVD present. No tracheal deviation present.  Loss of supraclavicular fat  Cardiovascular: Normal rate, regular rhythm, S1 normal, S2 normal and intact distal pulses.  Distant heart tones  Pulmonary/Chest: No accessory muscle usage or stridor. No tachypnea. He has decreased breath sounds (throughout all lung fields). He has no wheezes. He has no rhonchi. He has no rales.  Increased AP chest diameter  Abdominal: Soft. Bowel sounds are normal. He exhibits no distension. There is no  tenderness.  Musculoskeletal: He exhibits deformity (muscle wasting ). He exhibits no edema.  Neurological: He is alert and oriented to person, place, and time.  Skin: Skin is warm and dry. Capillary refill takes less than 2 seconds. No rash noted.  Psychiatric: He has a normal mood and affect. His behavior is normal.  Vitals reviewed.    Vitals:   06/04/18 0944  BP: 100/70  Pulse: (!) 52  SpO2: 98%  Weight: 108 lb 3.2 oz (49.1 kg)  Height: _0  (1.702 m)   98% on RA BMI Readings from Last 3 Encounters:  06/04/18 16.95 kg/m  05/27/18 15.99 kg/m  05/14/18 15.62 kg/m   Wt Readings from Last 3 Encounters:  06/04/18 108 lb 3.2 oz (49.1 kg)  05/27/18 102 lb 1.6 oz (46.3 kg)  05/14/18 99 lb 11.2 oz (45.2 kg)     CBC    Component Value Date/Time   WBC 4.6 06/03/2018 0907   WBC 6.9 05/02/2018 0509   RBC 3.63 (L) 06/03/2018 0907   HGB 9.9 (L) 06/03/2018 0907   HCT 32.1 (L) 06/03/2018 0907   PLT 323 06/03/2018 0907   MCV 88.4 06/03/2018 0907   MCH 27.3 06/03/2018 0907   MCHC 30.8 06/03/2018 0907   RDW 18.8 (H) 06/03/2018 0907   LYMPHSABS 0.3 (L)  06/03/2018 0907   MONOABS 0.7 06/03/2018 0907   EOSABS 0.0 06/03/2018 0907   BASOSABS 0.0 06/03/2018 0907    Chest Imaging: CT imaging of the chest reviewed.  Subsequent bronchoscopy. Large mediastinal paratracheal mass.,  Associated lymphadenopathy assistant with primary bronchogenic carcinoma The patient's images have been independently reviewed by me.    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  Currently going undergoing concomitant chemo XRT.  He is tolerating this well.  Recommended maintenance of current functional status.  Maintaining good oral intake.  Watching his weight and encouraged him to eat as much as possible. He probably has underlying COPD.  No PFTs on file. Will recommend as needed albuterol nebs for shortness of breath and wheezing We will give patient sample of Anoro  Follow-up in 3 months or if symptoms worsen.   Current Outpatient Medications:  .  acetaminophen (TYLENOL) 325 MG tablet, Take 2 tablets (650 mg total) by mouth every 6 (six) hours as needed for mild pain (or Fever >/= 101)., Disp: , Rfl:  .  lactose free nutrition (BOOST PLUS) LIQD, Take 237 mLs by mouth 3 (three) times daily between meals., Disp: 90 Can, Rfl: 0 .  Multiple Vitamin (MULTIVITAMIN WITH MINERALS) TABS tablet, Take 1 tablet by mouth daily., Disp: , Rfl:  .  polyethylene glycol (MIRALAX / GLYCOLAX) packet, Take 17 g by mouth daily as needed for mild constipation., Disp: 14 each, Rfl: 0 .  prochlorperazine (COMPAZINE) 10 MG tablet, Take 1 tablet (10 mg total) by mouth every 6 (six) hours as needed for nausea or vomiting., Disp: 30 tablet, Rfl:  0   Garner Nash, DO [facility] 06/04/2018 10:10 AM

## 2018-06-04 ENCOUNTER — Ambulatory Visit
Admission: RE | Admit: 2018-06-04 | Discharge: 2018-06-04 | Disposition: A | Discharge status: Home / Self Care | Payer: Self-pay | Source: Ambulatory Visit | Attending: Radiation Oncology | Admitting: Radiation Oncology

## 2018-06-04 ENCOUNTER — Ambulatory Visit (INDEPENDENT_AMBULATORY_CARE_PROVIDER_SITE_OTHER): Payer: Self-pay | Admitting: Pulmonary Disease

## 2018-06-04 ENCOUNTER — Encounter: Payer: Self-pay | Admitting: Pulmonary Disease

## 2018-06-04 VITALS — BP 100/70 | HR 52 | Ht 67.0 in | Wt 108.2 lb

## 2018-06-04 DIAGNOSIS — C3491 Malignant neoplasm of unspecified part of right bronchus or lung: Secondary | ICD-10-CM

## 2018-06-04 DIAGNOSIS — R64 Cachexia: Secondary | ICD-10-CM

## 2018-06-04 DIAGNOSIS — Z720 Tobacco use: Secondary | ICD-10-CM

## 2018-06-04 MED ORDER — ALBUTEROL SULFATE (2.5 MG/3ML) 0.083% IN NEBU: 2.5000 mg | INHALATION_SOLUTION | RESPIRATORY_TRACT | 5 refills | Status: DC | PRN

## 2018-06-04 MED ORDER — UMECLIDINIUM-VILANTEROL 62.5-25 MCG/INH IN AEPB: 1.0000 | INHALATION_SPRAY | Freq: Every day | RESPIRATORY_TRACT | 0 refills | Status: DC

## 2018-06-04 NOTE — Patient Instructions (Addendum)
Thank you for visiting Dr. Valeta Harms at [facility]. Today we recommend the following: Orders Placed This Encounter  Procedures  . Ambulatory Referral for DME   Meds ordered this encounter  Medications  . umeclidinium-vilanterol (ANORO ELLIPTA) 62.5-25 MCG/INH AEPB    Sig: Inhale 1 puff into the lungs daily.    Dispense:  60 each    Refill:  0    Order Specific Question:   Lot Number?    Answer:   [id number]    Order Specific Question:   Expiration Date?    Answer:   09/05/2019    Order Specific Question:   Quantity    Answer:   2  . albuterol (PROVENTIL) (2.5 MG/3ML) 0.083% nebulizer solution    Sig: Take 3 mLs (2.5 mg total) by nebulization every 4 (four) hours as needed for wheezing or shortness of breath.    Dispense:  75 mL    Refill:  5   Return in about 3 months (around 09/04/2018), or if symptoms worsen or fail to improve.  We are moving our office in November. The new address will be: [address] [employer] [address] Phone: [phone]

## 2018-06-04 NOTE — Addendum Note (Signed)
Addended by: Vivia Ewing on: 06/04/2018 10:20 AM   Modules accepted: Orders

## 2018-06-05 ENCOUNTER — Ambulatory Visit
Admission: RE | Admit: 2018-06-05 | Discharge: 2018-06-05 | Disposition: A | Discharge status: Home / Self Care | Payer: Self-pay | Source: Ambulatory Visit | Attending: Radiation Oncology | Admitting: Radiation Oncology

## 2018-06-05 ENCOUNTER — Telehealth: Payer: Self-pay | Admitting: Pulmonary Disease

## 2018-06-05 NOTE — Telephone Encounter (Signed)
Spoke with Urban Gibson at [facility]. They are needing a qualifying J code for the pt's Albuterol neb solution. The dx code that was put on the prescription (C34.91) does not qualify.  Dr. Valeta Harms - please advise. Thanks.

## 2018-06-05 NOTE — Telephone Encounter (Signed)
COPD, or emphysema.  Garner Nash, DO [facility] 06/05/2018 12:38 PM

## 2018-06-05 NOTE — Telephone Encounter (Signed)
Called [facility] at [phone]. Spoke to [place] and provided dx code of J44.9. Nothing further is needed.

## 2018-06-06 ENCOUNTER — Telehealth: Payer: Self-pay | Admitting: Pulmonary Disease

## 2018-06-06 ENCOUNTER — Other Ambulatory Visit: Payer: Self-pay | Admitting: Internal Medicine

## 2018-06-06 ENCOUNTER — Ambulatory Visit
Admission: RE | Admit: 2018-06-06 | Discharge: 2018-06-06 | Disposition: A | Discharge status: Home / Self Care | Payer: Self-pay | Source: Ambulatory Visit | Attending: Radiation Oncology | Admitting: Radiation Oncology

## 2018-06-06 NOTE — Telephone Encounter (Signed)
Called pt, line is busy X 3. I called Lynnae Sandhoff and he states pt does not have insurance but they are applying for Medicaid now and awaiting approval. Will call Corene Cornea tomorrow since it is after 5 pm.

## 2018-06-06 NOTE — Progress Notes (Signed)
I was asked to see this patient in the infusion room regarding bilateral lower extremity edema.  I reviewed the patient's labs.  His most recent labs showed a hemoglobin of 9.3 and a hematocrit of 32.1.  His albumin was noted to be low at 2.  I discussed with the patient that his bilateral lower extremity edema was likely related to hypoalbuminemia.  We discussed foods that were higher in protein that might help to correct this.  I also explained to him that improvements in his nutrition might be beneficial.  He was given samples of Ensure and Ensure powder which he can reconstitute with water.  This is high in vitamins and other essential nutrients.  Sandi Mealy, MHS, PA-C Physician Assistant

## 2018-06-07 ENCOUNTER — Ambulatory Visit
Admission: RE | Admit: 2018-06-07 | Discharge: 2018-06-07 | Disposition: A | Discharge status: Home / Self Care | Payer: Self-pay | Source: Ambulatory Visit | Attending: Radiation Oncology | Admitting: Radiation Oncology

## 2018-06-07 NOTE — Telephone Encounter (Signed)
Called Corene Cornea but no answer,  left message for him to call back.

## 2018-06-10 ENCOUNTER — Other Ambulatory Visit: Payer: Self-pay | Admitting: Internal Medicine

## 2018-06-10 ENCOUNTER — Ambulatory Visit
Admission: RE | Admit: 2018-06-10 | Discharge: 2018-06-10 | Disposition: A | Discharge status: Home / Self Care | Payer: Self-pay | Source: Ambulatory Visit | Attending: Radiation Oncology | Admitting: Radiation Oncology

## 2018-06-10 NOTE — Telephone Encounter (Signed)
Left Warren Blankenship a message to call back

## 2018-06-11 ENCOUNTER — Telehealth: Payer: Self-pay | Admitting: Oncology

## 2018-06-11 ENCOUNTER — Inpatient Hospital Stay: Payer: Self-pay

## 2018-06-11 ENCOUNTER — Inpatient Hospital Stay (HOSPITAL_BASED_OUTPATIENT_CLINIC_OR_DEPARTMENT_OTHER): Payer: Self-pay | Admitting: Oncology

## 2018-06-11 ENCOUNTER — Encounter: Payer: Self-pay | Admitting: *Deleted

## 2018-06-11 ENCOUNTER — Encounter: Payer: Self-pay | Admitting: Oncology

## 2018-06-11 ENCOUNTER — Ambulatory Visit: Payer: Self-pay

## 2018-06-11 ENCOUNTER — Other Ambulatory Visit: Payer: Self-pay | Admitting: *Deleted

## 2018-06-11 ENCOUNTER — Ambulatory Visit
Admission: RE | Admit: 2018-06-11 | Discharge: 2018-06-11 | Disposition: A | Discharge status: Home / Self Care | Payer: Self-pay | Source: Ambulatory Visit | Attending: Radiation Oncology | Admitting: Radiation Oncology

## 2018-06-11 VITALS — BP 94/78 | HR 110 | Temp 97.9°F | Resp 18 | Ht 67.0 in | Wt 101.6 lb

## 2018-06-11 VITALS — HR 106

## 2018-06-11 DIAGNOSIS — Z006 Encounter for examination for normal comparison and control in clinical research program: Secondary | ICD-10-CM

## 2018-06-11 DIAGNOSIS — C3411 Malignant neoplasm of upper lobe, right bronchus or lung: Secondary | ICD-10-CM

## 2018-06-11 DIAGNOSIS — Z5111 Encounter for antineoplastic chemotherapy: Secondary | ICD-10-CM

## 2018-06-11 DIAGNOSIS — C3491 Malignant neoplasm of unspecified part of right bronchus or lung: Secondary | ICD-10-CM

## 2018-06-11 DIAGNOSIS — C3401 Malignant neoplasm of right main bronchus: Secondary | ICD-10-CM

## 2018-06-11 DIAGNOSIS — Z720 Tobacco use: Secondary | ICD-10-CM

## 2018-06-11 LAB — CMP (CANCER CENTER ONLY)
ALBUMIN: 2.2 g/dL — AB (ref 3.5–5.0)
ALK PHOS: 90 U/L (ref 38–126)
ALT: 19 U/L (ref 0–44)
ANION GAP: 8 (ref 5–15)
AST: 23 U/L (ref 15–41)
BILIRUBIN TOTAL: 0.4 mg/dL (ref 0.3–1.2)
BUN: 7 mg/dL — ABNORMAL LOW (ref 8–23)
CALCIUM: 8.9 mg/dL (ref 8.9–10.3)
CO2: 25 mmol/L (ref 22–32)
Chloride: 101 mmol/L (ref 98–111)
Creatinine: 0.6 mg/dL — ABNORMAL LOW (ref 0.61–1.24)
Glucose, Bld: 88 mg/dL (ref 70–99)
POTASSIUM: 3.5 mmol/L (ref 3.5–5.1)
Sodium: 134 mmol/L — ABNORMAL LOW (ref 135–145)
TOTAL PROTEIN: 6.5 g/dL (ref 6.5–8.1)

## 2018-06-11 LAB — CBC WITH DIFFERENTIAL (CANCER CENTER ONLY)
Abs Immature Granulocytes: 0.01 10*3/uL (ref 0.00–0.07)
BASOS ABS: 0 10*3/uL (ref 0.0–0.1)
Basophils Relative: 1 %
EOS PCT: 0 %
Eosinophils Absolute: 0 10*3/uL (ref 0.0–0.5)
HEMATOCRIT: 34.9 % — AB (ref 39.0–52.0)
HEMOGLOBIN: 10.6 g/dL — AB (ref 13.0–17.0)
Immature Granulocytes: 0 %
LYMPHS ABS: 0.3 10*3/uL — AB (ref 0.7–4.0)
Lymphocytes Relative: 11 %
MCH: 27.4 pg (ref 26.0–34.0)
MCHC: 30.4 g/dL (ref 30.0–36.0)
MCV: 90.2 fL (ref 80.0–100.0)
MONO ABS: 0.6 10*3/uL (ref 0.1–1.0)
Monocytes Relative: 20 %
NRBC: 0 % (ref 0.0–0.2)
Neutro Abs: 2.1 10*3/uL (ref 1.7–7.7)
Neutrophils Relative %: 68 %
Platelet Count: 283 10*3/uL (ref 150–400)
RBC: 3.87 MIL/uL — ABNORMAL LOW (ref 4.22–5.81)
RDW: 19.6 % — ABNORMAL HIGH (ref 11.5–15.5)
WBC Count: 3.1 10*3/uL — ABNORMAL LOW (ref 4.0–10.5)

## 2018-06-11 MED ORDER — DIPHENHYDRAMINE HCL 50 MG/ML IJ SOLN
50.0000 mg | Freq: Once | INTRAMUSCULAR | Status: AC
  Administered 2018-06-11: 50 mg via INTRAVENOUS

## 2018-06-11 MED ORDER — SODIUM CHLORIDE 0.9 % IV SOLN
20.0000 mg | Freq: Once | INTRAVENOUS | Status: AC
  Administered 2018-06-11: 20 mg via INTRAVENOUS
  Filled 2018-06-11: qty 2

## 2018-06-11 MED ORDER — SODIUM CHLORIDE 0.9 % IV SOLN
45.0000 mg/m2 | Freq: Once | INTRAVENOUS | Status: AC
  Administered 2018-06-11: 66 mg via INTRAVENOUS
  Filled 2018-06-11: qty 11

## 2018-06-11 MED ORDER — SODIUM CHLORIDE 0.9 % IV SOLN
168.8000 mg | Freq: Once | INTRAVENOUS | Status: AC
  Administered 2018-06-11: 170 mg via INTRAVENOUS
  Filled 2018-06-11: qty 17

## 2018-06-11 MED ORDER — DIPHENHYDRAMINE HCL 50 MG/ML IJ SOLN
INTRAMUSCULAR | Status: AC
  Filled 2018-06-11: qty 1

## 2018-06-11 MED ORDER — PALONOSETRON HCL INJECTION 0.25 MG/5ML
INTRAVENOUS | Status: AC
  Filled 2018-06-11: qty 5

## 2018-06-11 MED ORDER — SODIUM CHLORIDE 0.9 % IV SOLN
Freq: Once | INTRAVENOUS | Status: AC
  Administered 2018-06-11: 11:00:00 via INTRAVENOUS
  Filled 2018-06-11: qty 250

## 2018-06-11 MED ORDER — FAMOTIDINE IN NACL 20-0.9 MG/50ML-% IV SOLN: 20.0000 mg | Freq: Once | INTRAVENOUS | Status: DC

## 2018-06-11 MED ORDER — PALONOSETRON HCL INJECTION 0.25 MG/5ML
0.2500 mg | Freq: Once | INTRAVENOUS | Status: AC
  Administered 2018-06-11: 0.25 mg via INTRAVENOUS

## 2018-06-11 NOTE — Progress Notes (Signed)
[place] OFFICE PROGRESS NOTE  Patient, No Pcp Per No address on file  DIAGNOSIS: Stage IIIB (T4, N2, M0)) non-small cell lung cancer, poorly differentiated carcinoma favoring adenocarcinoma presented with large right hilar and upper lobe lung mass with right hilar and mediastinal lymphadenopathy and compression of right mainstem bronchus as well as right middle lobe bronchus diagnosed in September 2019.  PRIOR THERAPY: None.  CURRENT THERAPY: Concurrent chemoradiation with weekly carboplatin for AUC of 2 and paclitaxel 45 MG/M2.  First dose May 14, 2018.  Status post 4 cycles.  INTERVAL HISTORY: Warren Blankenship 62 y.o. male returns for routine follow-up visit accompanied by his son.  The patient is feeling fine today and has no specific complaints.  He denies fevers and chills.  Denies chest pain, shortness of breath, hemoptysis.  He has an ongoing cough which is unchanged.  Denies nausea, vomiting, constipation, diarrhea.  Denies recent weight loss or night sweats.  Denies odynophagia.  The patient is here for evaluation prior to cycle #5 of chemotherapy.  MEDICAL HISTORY: Past Medical History:  Diagnosis Date  . Mass of right lung   . Tobacco abuse     ALLERGIES:  has No Known Allergies.  MEDICATIONS:  Current Outpatient Medications  Medication Sig Dispense Refill  . acetaminophen (TYLENOL) 325 MG tablet Take 2 tablets (650 mg total) by mouth every 6 (six) hours as needed for mild pain (or Fever >/= 101).    Marland Kitchen albuterol (PROVENTIL) (2.5 MG/3ML) 0.083% nebulizer solution Take 3 mLs (2.5 mg total) by nebulization every 4 (four) hours as needed for wheezing or shortness of breath. 75 mL 5  . lactose free nutrition (BOOST PLUS) LIQD Take 237 mLs by mouth 3 (three) times daily between meals. 90 Can 0  . Multiple Vitamin (MULTIVITAMIN WITH MINERALS) TABS tablet Take 1 tablet by mouth daily.    . polyethylene glycol (MIRALAX / GLYCOLAX) packet Take 17 g by mouth  daily as needed for mild constipation. 14 each 0  . prochlorperazine (COMPAZINE) 10 MG tablet TAKE 1 TABLET BY MOUTH EVERY 6 HOURS AS NEEDED FOR NAUSEA FOR VOMITING 30 tablet 0  . umeclidinium-vilanterol (ANORO ELLIPTA) 62.5-25 MCG/INH AEPB Inhale 1 puff into the lungs daily. 60 each 0   No current facility-administered medications for this visit.    Facility-Administered Medications Ordered in Other Visits  Medication Dose Route Frequency Provider Last Rate Last Dose  . CARBOplatin (PARAPLATIN) 170 mg in sodium chloride 0.9 % 250 mL chemo infusion  170 mg Intravenous Once Curt Bears, MD      . dexamethasone (DECADRON) 20 mg in sodium chloride 0.9 % 50 mL IVPB  20 mg Intravenous Once Curt Bears, MD      . famotidine (PEPCID) 20 mg in sodium chloride 0.9 % 100 mL IVPB  20 mg Intravenous Once Curt Bears, MD      . PACLitaxel (TAXOL) 66 mg in sodium chloride 0.9 % 150 mL chemo infusion (</= 30m/m2)  45 mg/m2 (Treatment Plan Recorded) Intravenous Once MCurt Bears MD        SURGICAL HISTORY:  Past Surgical History:  Procedure Laterality Date  . BIOPSY  04/29/2018   Procedure: BIOPSY;  Surgeon: IGarner Nash DO;  Location: [facility];  Service: Cardiopulmonary;;  . BRONCHIAL WASHINGS  04/29/2018   Procedure: BRONCHIAL WASHINGS;  Surgeon: IGarner Nash DO;  Location: [facility];  Service: Cardiopulmonary;;  . ENDOBRONCHIAL ULTRASOUND Bilateral 04/29/2018   Procedure: ENDOBRONCHIAL ULTRASOUND;  Surgeon: IGarner Nash  DO;  Location: [facility];  Service: Cardiopulmonary;  Laterality: Bilateral;  . Endobronchial Ultrasound (EBUS), Mediastinum  Bilateral 04/29/2018   Dr. Valeta Harms   . FINE NEEDLE ASPIRATION BIOPSY  04/29/2018   Procedure: FINE NEEDLE ASPIRATION BIOPSY;  Surgeon: Garner Nash, DO;  Location: [facility];  Service: Cardiopulmonary;;  . FLEXIBLE BRONCHOSCOPY  04/29/2018   Procedure: FLEXIBLE BRONCHOSCOPY;  Surgeon: Garner Nash, DO;  Location:  [facility];  Service: Cardiopulmonary;;  . TONSILLECTOMY      REVIEW OF SYSTEMS:   Review of Systems  Constitutional: Negative for appetite change, chills, fatigue, fever and unexpected weight change.  HENT:   Negative for mouth sores, nosebleeds, sore throat and trouble swallowing.   Eyes: Negative for eye problems and icterus.  Respiratory: Negative for hemoptysis, shortness of breath and wheezing.  Positive for cough which is unchanged from baseline. Cardiovascular: Negative for chest pain and leg swelling.  Gastrointestinal: Negative for abdominal pain, constipation, diarrhea, nausea and vomiting.  Genitourinary: Negative for bladder incontinence, difficulty urinating, dysuria, frequency and hematuria.   Musculoskeletal: Negative for back pain, gait problem, neck pain and neck stiffness.  Skin: Negative for itching and rash.  Neurological: Negative for dizziness, extremity weakness, gait problem, headaches, light-headedness and seizures.  Hematological: Negative for adenopathy. Does not bruise/bleed easily.  Psychiatric/Behavioral: Negative for confusion, depression and sleep disturbance. The patient is not nervous/anxious.     PHYSICAL EXAMINATION:  Blood pressure 94/78, pulse (!) 110, temperature 97.9 F (36.6 C), temperature source Oral, resp. rate 18, height _0  (1.702 m), weight 101 lb 9.6 oz (46.1 kg), SpO2 98 %.  ECOG PERFORMANCE STATUS: 1 - Symptomatic but completely ambulatory  Physical Exam  Constitutional: Oriented to person, place, and time. No distress.  HENT:  Head: Normocephalic and atraumatic.  Mouth/Throat: Oropharynx is clear and moist. No oropharyngeal exudate.  Eyes: Conjunctivae are normal. Right eye exhibits no discharge. Left eye exhibits no discharge. No scleral icterus.  Neck: Normal range of motion. Neck supple.  Cardiovascular: Normal rate, regular rhythm, normal heart sounds and intact distal pulses.   Pulmonary/Chest: Effort normal.  Scattered  expiratory wheezes. Abdominal: Soft. Bowel sounds are normal. Exhibits no distension and no mass. There is no tenderness.  Musculoskeletal: Normal range of motion. Exhibits no edema.  Lymphadenopathy:    No cervical adenopathy.  Neurological: Alert and oriented to person, place, and time. Exhibits normal muscle tone. Gait normal. Coordination normal.  Skin: Skin is warm and dry. No rash noted. Not diaphoretic. No erythema. No pallor.  Psychiatric: Mood, memory and judgment normal.  Vitals reviewed.  LABORATORY DATA: Lab Results  Component Value Date   WBC 3.1 (L) 06/11/2018   HGB 10.6 (L) 06/11/2018   HCT 34.9 (L) 06/11/2018   MCV 90.2 06/11/2018   PLT 283 06/11/2018      Chemistry      Component Value Date/Time   NA 134 (L) 06/11/2018 0944   K 3.5 06/11/2018 0944   CL 101 06/11/2018 0944   CO2 25 06/11/2018 0944   BUN 7 (L) 06/11/2018 0944   CREATININE 0.60 (L) 06/11/2018 0944      Component Value Date/Time   CALCIUM 8.9 06/11/2018 0944   ALKPHOS 90 06/11/2018 0944   AST 23 06/11/2018 0944   ALT 19 06/11/2018 0944   BILITOT 0.4 06/11/2018 0944       RADIOGRAPHIC STUDIES:  No results found.   ASSESSMENT/PLAN:  Stage III squamous cell carcinoma of right lung [facility]) This is a very pleasant  62 year old white male recently diagnosed with a stage IIIb non-small cell lung cancer.  The patient is here today for evaluation prior to cycle #5 of concurrent chemoradiation with weekly carboplatin for AUC of 2 and paclitaxel 45 MG/M2.    The patient is tolerating this treatment well with no concerning complaints. Recommend for him to proceed with cycle #5 of his chemotherapy today as scheduled.  He will follow-up next week for labs and chemo.  We will see him back in 2 weeks for evaluation and repeat lab work.  He was advised to call immediately if he has any concerning symptoms in the interval. The patient voices understanding of current disease status and treatment options and  is in agreement with the current care plan.  All questions were answered. The patient knows to call the clinic with any problems, questions or concerns. We can certainly see the patient much sooner if necessary.   No orders of the defined types were placed in this encounter.    [name], DNP, AGPCNP-BC, AOCNP 06/11/18

## 2018-06-11 NOTE — Research (Signed)
PATHOLOGY PROCUREMENT : Customer service manager for the Discovery and Validation of Biomarkers for the Prediction, Diagnosis, and Management of Disease  I met with patient and son, in-clinic for scheduled appointments, in exam room after his appointment with [name], NP.  Reminded pt about the study and he stated  that he would like to participate and was interested in signing consent today. I reviewed with patient, page by page, both the study authorization and consent form. Patient expressed understanding to the purpose of the study, the voluntary nature of the study, the procedure, potential risks and benefits, as well as, how his information would be kept confidential, and any costs and the compensation. After all of patient's questions answered to his satisfaction, patient proceeded to sign both the study authorization and ICF. Patient was informed that the one-time study blood draw would occur at her next lab appointment when in clinic, already scheduled for 06/17/2018. At that appointment, a research assistant will provide patient with the $50 debit card, provided by study, after the blood draw. Patient gave verbal understanding and denied any other questions at this time. Patient was provided with copies of both signed documents, for his records, as well as the Clinical Trials information pamphlet and my contact information. Patient was thanked for his time and his contribution to study and was encouraged to call with questions.  Approximately 15 minutes was spent consenting patient. Farris Has, Research Assistant

## 2018-06-11 NOTE — Telephone Encounter (Signed)
Corene Cornea returned Emily's call please call back [phone])

## 2018-06-11 NOTE — Telephone Encounter (Signed)
Called and spoke with Warren Blankenship at [facility] stated to him that pt currently does not have any insurance but has applied for medicaid and is currently awaiting approval.  Per Warren Blankenship, there is not much that they can do for pt until he has been approved medicaid but stated they could do private pay for pt at $47.  Attempted to call pt but unable to reach him. Left message for pt to return call.

## 2018-06-11 NOTE — Patient Instructions (Signed)
   [facility] Discharge Instructions for Patients Receiving Chemotherapy  Today you received the following chemotherapy agents Taxol and Carboplatin   To help prevent nausea and vomiting after your treatment, we encourage you to take your nausea medication as directed.    If you develop nausea and vomiting that is not controlled by your nausea medication, call the clinic.   BELOW ARE SYMPTOMS THAT SHOULD BE REPORTED IMMEDIATELY:  *FEVER GREATER THAN 100.5 F  *CHILLS WITH OR WITHOUT FEVER  NAUSEA AND VOMITING THAT IS NOT CONTROLLED WITH YOUR NAUSEA MEDICATION  *UNUSUAL SHORTNESS OF BREATH  *UNUSUAL BRUISING OR BLEEDING  TENDERNESS IN MOUTH AND THROAT WITH OR WITHOUT PRESENCE OF ULCERS  *URINARY PROBLEMS  *BOWEL PROBLEMS  UNUSUAL RASH Items with * indicate a potential emergency and should be followed up as soon as possible.  Feel free to call the clinic should you have any questions or concerns. The clinic phone number is [phone].  Please show the CHEMO ALERT CARD at check-in to the Emergency Department and triage nurse.   

## 2018-06-11 NOTE — Progress Notes (Signed)
Ok to treat with elevated HR per K. Curcio, NP

## 2018-06-11 NOTE — Telephone Encounter (Signed)
Called and spoke with Lynnae Sandhoff to see if pt had been able to get things arranged for the medicaid yet and per Lynnae Sandhoff, due to pt having chemo, they have not been able to get it taken care of yet.  Lynnae Sandhoff stated they plan to go one day this week to get the medicaid taken care of for pt.  I stated to Lynnae Sandhoff the information from [place] with [facility] in regards to the private pay. Stated to [place] once pt has been able to get medicaid approved to call our office so then we can be in contact with [facility].  [place] expressed understanding. Will leave encounter open.

## 2018-06-11 NOTE — Telephone Encounter (Signed)
Appts already scheduled per 11/12 los - no additional appts made.

## 2018-06-11 NOTE — Assessment & Plan Note (Addendum)
This is a very pleasant 62 year old white male recently diagnosed with a stage IIIb non-small cell lung cancer.  The patient is here today for evaluation prior to cycle #5 of concurrent chemoradiation with weekly carboplatin for AUC of 2 and paclitaxel 45 MG/M2.    The patient is tolerating this treatment well with no concerning complaints. Recommend for him to proceed with cycle #5 of his chemotherapy today as scheduled.  He will follow-up next week for labs and chemo.  We will see him back in 2 weeks for evaluation and repeat lab work.  He was advised to call immediately if he has any concerning symptoms in the interval. The patient voices understanding of current disease status and treatment options and is in agreement with the current care plan.  All questions were answered. The patient knows to call the clinic with any problems, questions or concerns. We can certainly see the patient much sooner if necessary.

## 2018-06-12 ENCOUNTER — Ambulatory Visit
Admission: RE | Admit: 2018-06-12 | Discharge: 2018-06-12 | Disposition: A | Discharge status: Home / Self Care | Payer: Self-pay | Source: Ambulatory Visit | Attending: Radiation Oncology | Admitting: Radiation Oncology

## 2018-06-12 ENCOUNTER — Ambulatory Visit: Payer: Self-pay

## 2018-06-13 ENCOUNTER — Ambulatory Visit
Admission: RE | Admit: 2018-06-13 | Discharge: 2018-06-13 | Disposition: A | Discharge status: Home / Self Care | Payer: Self-pay | Source: Ambulatory Visit | Attending: Radiation Oncology | Admitting: Radiation Oncology

## 2018-06-13 ENCOUNTER — Ambulatory Visit: Payer: Self-pay

## 2018-06-14 ENCOUNTER — Ambulatory Visit
Admission: RE | Admit: 2018-06-14 | Discharge: 2018-06-14 | Disposition: A | Discharge status: Home / Self Care | Payer: Self-pay | Source: Ambulatory Visit | Attending: Radiation Oncology | Admitting: Radiation Oncology

## 2018-06-14 ENCOUNTER — Ambulatory Visit: Payer: Self-pay

## 2018-06-14 ENCOUNTER — Encounter: Payer: Self-pay | Admitting: *Deleted

## 2018-06-14 NOTE — Progress Notes (Signed)
Oncology Nurse Navigator Documentation  Oncology Nurse Navigator Flowsheets 06/14/2018  Navigator Location [facility]  Navigator Encounter Type Other/I called [employer] to get an update on results.  I was told they may not result it out due to patient is unable to pay bill.  I listened as she explained and I requested to complete.  They will reach out to patient today to see about bill.  I will update Dr. Julien Nordmann.   Treatment Phase Treatment  Barriers/Navigation Needs Coordination of Care  Interventions Coordination of Care  Coordination of Care Other  Acuity Level 2  Time Spent with Patient 30

## 2018-06-17 ENCOUNTER — Encounter: Payer: Self-pay | Admitting: Radiation Oncology

## 2018-06-17 ENCOUNTER — Inpatient Hospital Stay: Payer: Self-pay

## 2018-06-17 ENCOUNTER — Inpatient Hospital Stay: Payer: Self-pay | Admitting: Nutrition

## 2018-06-17 ENCOUNTER — Ambulatory Visit
Admission: RE | Admit: 2018-06-17 | Discharge: 2018-06-17 | Disposition: A | Discharge status: Home / Self Care | Payer: Self-pay | Source: Ambulatory Visit | Attending: Radiation Oncology | Admitting: Radiation Oncology

## 2018-06-17 ENCOUNTER — Encounter: Payer: Self-pay | Admitting: *Deleted

## 2018-06-17 VITALS — BP 95/66 | HR 100 | Temp 98.6°F | Resp 18 | Ht 67.0 in | Wt 99.8 lb

## 2018-06-17 DIAGNOSIS — Z006 Encounter for examination for normal comparison and control in clinical research program: Secondary | ICD-10-CM

## 2018-06-17 DIAGNOSIS — C3491 Malignant neoplasm of unspecified part of right bronchus or lung: Secondary | ICD-10-CM

## 2018-06-17 LAB — CMP (CANCER CENTER ONLY)
ALBUMIN: 2.3 g/dL — AB (ref 3.5–5.0)
ALK PHOS: 78 U/L (ref 38–126)
ALT: 16 U/L (ref 0–44)
ANION GAP: 11 (ref 5–15)
AST: 19 U/L (ref 15–41)
BILIRUBIN TOTAL: 0.3 mg/dL (ref 0.3–1.2)
BUN: 12 mg/dL (ref 8–23)
CO2: 25 mmol/L (ref 22–32)
Calcium: 9.3 mg/dL (ref 8.9–10.3)
Chloride: 100 mmol/L (ref 98–111)
Creatinine: 0.65 mg/dL (ref 0.61–1.24)
GFR, Est AFR Am: >60 mL/min (ref >60)
GFR, Estimated: >60 mL/min (ref >60)
GLUCOSE: 141 mg/dL — AB (ref 70–99)
Potassium: 3.7 mmol/L (ref 3.5–5.1)
Sodium: 136 mmol/L (ref 135–145)
TOTAL PROTEIN: 6.9 g/dL (ref 6.5–8.1)

## 2018-06-17 LAB — CBC WITH DIFFERENTIAL (CANCER CENTER ONLY)
ABS IMMATURE GRANULOCYTES: 0.01 10*3/uL (ref 0.00–0.07)
BASOS PCT: 1 %
Basophils Absolute: 0 10*3/uL (ref 0.0–0.1)
Eosinophils Absolute: 0 10*3/uL (ref 0.0–0.5)
Eosinophils Relative: 0 %
HCT: 33.9 % — ABNORMAL LOW (ref 39.0–52.0)
Hemoglobin: 10.4 g/dL — ABNORMAL LOW (ref 13.0–17.0)
IMMATURE GRANULOCYTES: 0 %
Lymphocytes Relative: 9 %
Lymphs Abs: 0.3 10*3/uL — ABNORMAL LOW (ref 0.7–4.0)
MCH: 27.8 pg (ref 26.0–34.0)
MCHC: 30.7 g/dL (ref 30.0–36.0)
MCV: 90.6 fL (ref 80.0–100.0)
Monocytes Absolute: 0.3 10*3/uL (ref 0.1–1.0)
Monocytes Relative: 9 %
NEUTROS ABS: 2.3 10*3/uL (ref 1.7–7.7)
NEUTROS PCT: 81 %
PLATELETS: 303 10*3/uL (ref 150–400)
RBC: 3.74 MIL/uL — ABNORMAL LOW (ref 4.22–5.81)
RDW: 19.7 % — AB (ref 11.5–15.5)
WBC: 2.8 10*3/uL — AB (ref 4.0–10.5)
nRBC: 0 % (ref 0.0–0.2)

## 2018-06-17 LAB — RESEARCH LABS

## 2018-06-17 MED ORDER — DIPHENHYDRAMINE HCL 50 MG/ML IJ SOLN
50.0000 mg | Freq: Once | INTRAMUSCULAR | Status: AC
  Administered 2018-06-17: 50 mg via INTRAVENOUS

## 2018-06-17 MED ORDER — FAMOTIDINE IN NACL 20-0.9 MG/50ML-% IV SOLN
INTRAVENOUS | Status: AC
  Filled 2018-06-17: qty 50

## 2018-06-17 MED ORDER — PALONOSETRON HCL INJECTION 0.25 MG/5ML
INTRAVENOUS | Status: AC
  Filled 2018-06-17: qty 5

## 2018-06-17 MED ORDER — SODIUM CHLORIDE 0.9 % IV SOLN
168.8000 mg | Freq: Once | INTRAVENOUS | Status: AC
  Administered 2018-06-17: 170 mg via INTRAVENOUS
  Filled 2018-06-17: qty 17

## 2018-06-17 MED ORDER — SODIUM CHLORIDE 0.9 % IV SOLN
20.0000 mg | Freq: Once | INTRAVENOUS | Status: AC
  Administered 2018-06-17: 20 mg via INTRAVENOUS
  Filled 2018-06-17: qty 2

## 2018-06-17 MED ORDER — FAMOTIDINE IN NACL 20-0.9 MG/50ML-% IV SOLN
20.0000 mg | Freq: Once | INTRAVENOUS | Status: AC
  Administered 2018-06-17: 20 mg via INTRAVENOUS

## 2018-06-17 MED ORDER — DIPHENHYDRAMINE HCL 50 MG/ML IJ SOLN
INTRAMUSCULAR | Status: AC
  Filled 2018-06-17: qty 1

## 2018-06-17 MED ORDER — DEXAMETHASONE SODIUM PHOSPHATE 10 MG/ML IJ SOLN
INTRAMUSCULAR | Status: AC
  Filled 2018-06-17: qty 1

## 2018-06-17 MED ORDER — PALONOSETRON HCL INJECTION 0.25 MG/5ML
0.2500 mg | Freq: Once | INTRAVENOUS | Status: AC
  Administered 2018-06-17: 0.25 mg via INTRAVENOUS

## 2018-06-17 MED ORDER — SODIUM CHLORIDE 0.9 % IV SOLN
Freq: Once | INTRAVENOUS | Status: AC
  Administered 2018-06-17: 11:00:00 via INTRAVENOUS
  Filled 2018-06-17: qty 250

## 2018-06-17 MED ORDER — SODIUM CHLORIDE 0.9 % IV SOLN
45.0000 mg/m2 | Freq: Once | INTRAVENOUS | Status: AC
  Administered 2018-06-17: 66 mg via INTRAVENOUS
  Filled 2018-06-17: qty 11

## 2018-06-17 NOTE — Progress Notes (Signed)
Patient in clinic today, labs drawn per protocol- Lung [facility]. Patient was given a $50 debit card for participating in the research study. Patient was thanked for his time and contribution to the study.

## 2018-06-17 NOTE — Telephone Encounter (Signed)
Attempted to call pt and Lynnae Sandhoff to see if they were able to get things taken care of for medicaid for pt but unable to reach them. Left message for Lynnae Sandhoff and pt to return call.

## 2018-06-17 NOTE — Patient Instructions (Signed)
[facility] Discharge Instructions for Patients Receiving Chemotherapy  Today you received the following chemotherapy agents Paclitaxel (Taxol) & Carboplatin (Paraplatin).  To help prevent nausea and vomiting after your treatment, we encourage you to take your nausea medication as prescribed.   If you develop nausea and vomiting that is not controlled by your nausea medication, call the clinic.   BELOW ARE SYMPTOMS THAT SHOULD BE REPORTED IMMEDIATELY:  *FEVER GREATER THAN 100.5 F  *CHILLS WITH OR WITHOUT FEVER  NAUSEA AND VOMITING THAT IS NOT CONTROLLED WITH YOUR NAUSEA MEDICATION  *UNUSUAL SHORTNESS OF BREATH  *UNUSUAL BRUISING OR BLEEDING  TENDERNESS IN MOUTH AND THROAT WITH OR WITHOUT PRESENCE OF ULCERS  *URINARY PROBLEMS  *BOWEL PROBLEMS  UNUSUAL RASH Items with * indicate a potential emergency and should be followed up as soon as possible.  Feel free to call the clinic should you have any questions or concerns. The clinic phone number is [phone].  Please show the [place] at check-in to the Emergency Department and triage nurse.

## 2018-06-17 NOTE — Progress Notes (Signed)
62 year old male diagnosed with non-small cell lung cancer.  He is a patient of Dr. Julien Nordmann.  Past medical medical history includes tobacco.  Medications include MVI, MiraLAX, and Compazine.  Labs include albumin 2.0.  Height: 5 feet 7 inches. Weight: 99.75 pounds November 2018. Usual body weight: 100 pounds since August. BMI: 15.62.  Patient denies nutrition impact symptoms. He reports that he eats well and is eating as he normally does. He reports he usually eats 2 meals a day and grazes throughout the day. He typically will drink 1 boost original daily.  Nutrition diagnosis:  Underweight related to non-small cell lung cancer and associated treatments as evidenced by BMI of 15.62.  Severe malnutrition in the context of chronic illness secondary to severe depletion of muscle mass and body fat and BMI of 15.62  Intervention: Reviewed strategies for increasing calories and protein throughout the day and provided fact sheet. Recommended patient increase boost to boost plus twice daily between meals.  Provided coupons. Educated patient on suggestions for making milkshakes using oral nutrition supplements and provided recipe fact sheet. Questions were answered.  Teach back method used.  Contact information provided.  Monitoring, evaluation, goals: Patient will tolerate increased calories and protein to minimize weight loss.  Next visit: December 2 during infusion.  **Disclaimer: This note was dictated with voice recognition software. Similar sounding words can inadvertently be transcribed and this note may contain transcription errors which may not have been corrected upon publication of note.**

## 2018-06-19 NOTE — Telephone Encounter (Signed)
Attempted to call pt and Lynnae Sandhoff but unable to reach them. Left message for Lynnae Sandhoff or Evelena Peat to return call.

## 2018-06-20 NOTE — Telephone Encounter (Signed)
Attempted to call pt but unable to reach him. Left message for pt to return call. Due to multiple attempts trying to reach pt, per triage protocol message will be closed.

## 2018-06-24 ENCOUNTER — Telehealth: Payer: Self-pay | Admitting: Oncology

## 2018-06-24 ENCOUNTER — Encounter: Payer: Self-pay | Admitting: Oncology

## 2018-06-24 ENCOUNTER — Inpatient Hospital Stay: Payer: Self-pay

## 2018-06-24 ENCOUNTER — Inpatient Hospital Stay (HOSPITAL_BASED_OUTPATIENT_CLINIC_OR_DEPARTMENT_OTHER): Payer: Self-pay | Admitting: Oncology

## 2018-06-24 VITALS — BP 91/61 | HR 112 | Temp 98.6°F | Resp 14 | Ht 67.0 in | Wt 96.9 lb

## 2018-06-24 DIAGNOSIS — C3491 Malignant neoplasm of unspecified part of right bronchus or lung: Secondary | ICD-10-CM

## 2018-06-24 LAB — CBC WITH DIFFERENTIAL (CANCER CENTER ONLY)
ABS IMMATURE GRANULOCYTES: 0.01 10*3/uL (ref 0.00–0.07)
BASOS PCT: 1 %
Basophils Absolute: 0 10*3/uL (ref 0.0–0.1)
Eosinophils Absolute: 0 10*3/uL (ref 0.0–0.5)
Eosinophils Relative: 0 %
HCT: 36.3 % — ABNORMAL LOW (ref 39.0–52.0)
Hemoglobin: 11.2 g/dL — ABNORMAL LOW (ref 13.0–17.0)
IMMATURE GRANULOCYTES: 0 %
Lymphocytes Relative: 19 %
Lymphs Abs: 0.5 10*3/uL — ABNORMAL LOW (ref 0.7–4.0)
MCH: 27.9 pg (ref 26.0–34.0)
MCHC: 30.9 g/dL (ref 30.0–36.0)
MCV: 90.3 fL (ref 80.0–100.0)
MONO ABS: 0.5 10*3/uL (ref 0.1–1.0)
MONOS PCT: 18 %
NEUTROS ABS: 1.5 10*3/uL — AB (ref 1.7–7.7)
NEUTROS PCT: 62 %
PLATELETS: 288 10*3/uL (ref 150–400)
RBC: 4.02 MIL/uL — AB (ref 4.22–5.81)
RDW: 20.7 % — ABNORMAL HIGH (ref 11.5–15.5)
WBC Count: 2.5 10*3/uL — ABNORMAL LOW (ref 4.0–10.5)
nRBC: 0 % (ref 0.0–0.2)

## 2018-06-24 LAB — CMP (CANCER CENTER ONLY)
ALBUMIN: 2.5 g/dL — AB (ref 3.5–5.0)
ALT: 15 U/L (ref 0–44)
AST: 20 U/L (ref 15–41)
Alkaline Phosphatase: 78 U/L (ref 38–126)
Anion gap: 13 (ref 5–15)
BUN: 10 mg/dL (ref 8–23)
CHLORIDE: 98 mmol/L (ref 98–111)
CO2: 24 mmol/L (ref 22–32)
Calcium: 9.5 mg/dL (ref 8.9–10.3)
Creatinine: 0.67 mg/dL (ref 0.61–1.24)
GFR, Est AFR Am: >60 mL/min (ref >60)
GFR, Estimated: >60 mL/min (ref >60)
GLUCOSE: 109 mg/dL — AB (ref 70–99)
POTASSIUM: 3.9 mmol/L (ref 3.5–5.1)
SODIUM: 135 mmol/L (ref 135–145)
Total Bilirubin: 0.3 mg/dL (ref 0.3–1.2)
Total Protein: 7.1 g/dL (ref 6.5–8.1)

## 2018-06-24 NOTE — Assessment & Plan Note (Signed)
This is a very pleasant 62 year old white male recently diagnosed with a stage IIIb non-small cell lung cancer.  The patient received a course of concurrent chemoradiation with weekly carboplatin for AUC of 2 and paclitaxel 45 MG/M2. Status post 6 cycles which he tolerated fairly well.  He completed his radiation last week.   We will discontinue his remaining chemotherapy infusion appointments.  He will have a restaging CT scan of the chest in approximately 3 weeks and follow-up 1 to 2 days after the CT scan to discuss the results.  For cough, wheezing, and shortness of breath I have advised him to follow-up with his pulmonologist to discuss his medications.  He has not been able to afford his nebulizer because his Medicaid is pending.  He was advised to call immediately if he has any concerning symptoms in the interval. The patient voices understanding of current disease status and treatment options and is in agreement with the current care plan.  All questions were answered. The patient knows to call the clinic with any problems, questions or concerns. We can certainly see the patient much sooner if necessary.

## 2018-06-24 NOTE — Telephone Encounter (Signed)
Printed calendar and avs. °

## 2018-06-24 NOTE — Progress Notes (Signed)
[place] OFFICE PROGRESS NOTE  Patient, No Pcp Per No address on file  DIAGNOSIS:Stage IIIB (T4, N2, M0)) non-small cell lung cancer, poorly differentiated carcinoma favoring adenocarcinoma presented with large right hilar and upper lobe lung mass with right hilar and mediastinal lymphadenopathy and compression of right mainstem bronchus as well as right middle lobe bronchus diagnosed in September 2019.  PRIOR THERAPY: None.  CURRENT THERAPY: Concurrent chemoradiation with weekly carboplatin for AUC of 2 and paclitaxel 45MG/M2. First dose May 14, 2018.Status post 6 cycles.  INTERVAL HISTORY: Warren Blankenship 62 y.o. male returns for routine follow-up visit accompanied by his friend.  The patient is feeling fine today and has no specific complaints except for mild odynophagia.  He completed radiation last week.  He reports that he is still able to eat but has some mild discomfort when he tries to eat.  He denies fevers and chills.  Denies chest pain and hemoptysis.  He has an ongoing cough.  Reports that he has not been able to afford his nebulizer and has run out of samples of Anoro.  He reports shortness of breath with exertion.  Denies nausea, vomiting, constipation, diarrhea.  He reports that his appetite is fair and he is drinking 2 boost per day.  He has lost a few pounds since his last visit.  The patient is here for evaluation and repeat lab work.  MEDICAL HISTORY: Past Medical History:  Diagnosis Date  . Mass of right lung   . Tobacco abuse     ALLERGIES:  has No Known Allergies.  MEDICATIONS:  Current Outpatient Medications  Medication Sig Dispense Refill  . acetaminophen (TYLENOL) 325 MG tablet Take 2 tablets (650 mg total) by mouth every 6 (six) hours as needed for mild pain (or Fever >/= 101).    . lactose free nutrition (BOOST PLUS) LIQD Take 237 mLs by mouth 3 (three) times daily between meals. 90 Can 0  . Multiple Vitamin (MULTIVITAMIN WITH  MINERALS) TABS tablet Take 1 tablet by mouth daily.    . polyethylene glycol (MIRALAX / GLYCOLAX) packet Take 17 g by mouth daily as needed for mild constipation. 14 each 0  . prochlorperazine (COMPAZINE) 10 MG tablet TAKE 1 TABLET BY MOUTH EVERY 6 HOURS AS NEEDED FOR NAUSEA FOR VOMITING 30 tablet 0  . albuterol (PROVENTIL) (2.5 MG/3ML) 0.083% nebulizer solution Take 3 mLs (2.5 mg total) by nebulization every 4 (four) hours as needed for wheezing or shortness of breath. (Patient not taking: Reported on 06/24/2018) 75 mL 5  . umeclidinium-vilanterol (ANORO ELLIPTA) 62.5-25 MCG/INH AEPB Inhale 1 puff into the lungs daily. (Patient not taking: Reported on 06/24/2018) 60 each 0   No current facility-administered medications for this visit.     SURGICAL HISTORY:  Past Surgical History:  Procedure Laterality Date  . BIOPSY  04/29/2018   Procedure: BIOPSY;  Surgeon: Garner Nash, DO;  Location: [facility];  Service: Cardiopulmonary;;  . BRONCHIAL WASHINGS  04/29/2018   Procedure: BRONCHIAL WASHINGS;  Surgeon: Garner Nash, DO;  Location: [facility];  Service: Cardiopulmonary;;  . ENDOBRONCHIAL ULTRASOUND Bilateral 04/29/2018   Procedure: ENDOBRONCHIAL ULTRASOUND;  Surgeon: Garner Nash, DO;  Location: [facility];  Service: Cardiopulmonary;  Laterality: Bilateral;  . Endobronchial Ultrasound (EBUS), Mediastinum  Bilateral 04/29/2018   Dr. Valeta Harms   . FINE NEEDLE ASPIRATION BIOPSY  04/29/2018   Procedure: FINE NEEDLE ASPIRATION BIOPSY;  Surgeon: Garner Nash, DO;  Location: [facility];  Service: Cardiopulmonary;;  .  FLEXIBLE BRONCHOSCOPY  04/29/2018   Procedure: FLEXIBLE BRONCHOSCOPY;  Surgeon: Garner Nash, DO;  Location: [facility];  Service: Cardiopulmonary;;  . TONSILLECTOMY      REVIEW OF SYSTEMS:   Review of Systems  Constitutional: Negative for appetite change, chills, fatigue, fever.  Positive for weight loss. HENT:   Negative for mouth sores, nosebleeds.  Positive  for mild odynophagia. Eyes: Negative for eye problems and icterus.  Respiratory: Negative for hemoptysis.  Positive for cough and shortness of breath with exertion. Cardiovascular: Negative for chest pain and leg swelling.  Gastrointestinal: Negative for abdominal pain, constipation, diarrhea, nausea and vomiting.  Genitourinary: Negative for bladder incontinence, difficulty urinating, dysuria, frequency and hematuria.   Musculoskeletal: Negative for back pain, gait problem, neck pain and neck stiffness.  Skin: Negative for itching and rash.  Neurological: Negative for dizziness, extremity weakness, gait problem, headaches, light-headedness and seizures.  Hematological: Negative for adenopathy. Does not bruise/bleed easily.  Psychiatric/Behavioral: Negative for confusion, depression and sleep disturbance. The patient is not nervous/anxious.     PHYSICAL EXAMINATION:  Blood pressure 91/61, pulse (!) 112, temperature 98.6 F (37 C), temperature source Oral, resp. rate 14, height _0  (1.702 m), weight 96 lb 14.4 oz (44 kg), SpO2 92 %.  ECOG PERFORMANCE STATUS: 1 - Symptomatic but completely ambulatory  Physical Exam  Constitutional: Oriented to person, place, and time. No distress.  HENT:  Head: Normocephalic and atraumatic.  Mouth/Throat: Oropharynx is clear and moist. No oropharyngeal exudate.  Eyes: Conjunctivae are normal. Right eye exhibits no discharge. Left eye exhibits no discharge. No scleral icterus.  Neck: Normal range of motion. Neck supple.  Cardiovascular: Normal rate, regular rhythm, normal heart sounds and intact distal pulses.   Pulmonary/Chest: Effort normal.  Expiratory wheezes noted in the posterior lung fields.  These improved with cough. Abdominal: Soft. Bowel sounds are normal. Exhibits no distension and no mass. There is no tenderness.  Musculoskeletal: Normal range of motion. Exhibits no edema.  Lymphadenopathy:    No cervical adenopathy.  Neurological: Alert  and oriented to person, place, and time. Exhibits normal muscle tone. Gait normal. Coordination normal.  Skin: Skin is warm and dry. No rash noted. Not diaphoretic. No erythema. No pallor.  Psychiatric: Mood, memory and judgment normal.  Vitals reviewed.  LABORATORY DATA: Lab Results  Component Value Date   WBC 2.5 (L) 06/24/2018   HGB 11.2 (L) 06/24/2018   HCT 36.3 (L) 06/24/2018   MCV 90.3 06/24/2018   PLT 288 06/24/2018      Chemistry      Component Value Date/Time   NA 135 06/24/2018 1048   K 3.9 06/24/2018 1048   CL 98 06/24/2018 1048   CO2 24 06/24/2018 1048   BUN 10 06/24/2018 1048   CREATININE 0.67 06/24/2018 1048      Component Value Date/Time   CALCIUM 9.5 06/24/2018 1048   ALKPHOS 78 06/24/2018 1048   AST 20 06/24/2018 1048   ALT 15 06/24/2018 1048   BILITOT 0.3 06/24/2018 1048       RADIOGRAPHIC STUDIES:  No results found.   ASSESSMENT/PLAN:  Stage III squamous cell carcinoma of right lung [facility]) This is a very pleasant 62 year old white male recently diagnosed with a stage IIIb non-small cell lung cancer.  The patient received a course of concurrent chemoradiation with weekly carboplatin for AUC of 2 and paclitaxel 45 MG/M2. Status post 6 cycles which he tolerated fairly well.  He completed his radiation last week.   We  will discontinue his remaining chemotherapy infusion appointments.  He will have a restaging CT scan of the chest in approximately 3 weeks and follow-up 1 to 2 days after the CT scan to discuss the results.  For cough, wheezing, and shortness of breath I have advised him to follow-up with his pulmonologist to discuss his medications.  He has not been able to afford his nebulizer because his Medicaid is pending.  He was advised to call immediately if he has any concerning symptoms in the interval. The patient voices understanding of current disease status and treatment options and is in agreement with the current care plan.  All questions  were answered. The patient knows to call the clinic with any problems, questions or concerns. We can certainly see the patient much sooner if necessary.   Orders Placed This Encounter  Procedures  . CT CHEST W CONTRAST    Standing Status:   Future    Standing Expiration Date:   06/25/2019    Order Specific Question:   If indicated for the ordered procedure, I authorize the administration of contrast media per Radiology protocol    Answer:   Yes    Order Specific Question:   Preferred imaging location?    Answer:   [facility]    Order Specific Question:   Radiology Contrast Protocol - do NOT remove file path    Answer:   [email or web address]    Order Specific Question:   ** REASON FOR EXAM (FREE TEXT)    Answer:   Lung cancer. Restaging.  Marland Kitchen CBC with Differential (Cancer Center Only)    Standing Status:   Future    Standing Expiration Date:   06/25/2019  . CMP ([place] only)    Standing Status:   Future    Standing Expiration Date:   06/25/2019     [name], DNP, AGPCNP-BC, AOCNP 06/24/18

## 2018-06-25 ENCOUNTER — Other Ambulatory Visit: Payer: Self-pay

## 2018-06-25 ENCOUNTER — Telehealth: Payer: Self-pay | Admitting: Pulmonary Disease

## 2018-06-25 DIAGNOSIS — C3491 Malignant neoplasm of unspecified part of right bronchus or lung: Secondary | ICD-10-CM

## 2018-06-25 MED ORDER — ALBUTEROL SULFATE (2.5 MG/3ML) 0.083% IN NEBU: 2.5000 mg | INHALATION_SOLUTION | RESPIRATORY_TRACT | 5 refills | Status: AC | PRN

## 2018-06-25 NOTE — Telephone Encounter (Signed)
Spoke with patient's son Lynnae Sandhoff. He stated that he would prefer to have the neb solution sent to [facility] and have the RX for the neb machine mailed to his house. Advised him that I would do so.   The nebulizer RX will need to be mailed to [place] [id number].   Will route to [place] so she can have [facility] sign the neb machine RX.

## 2018-06-25 NOTE — Telephone Encounter (Signed)
Order printed, signed by Valeta Harms, and placed upfront for outgoing mail. Nothing further is needed at this time.

## 2018-06-25 NOTE — Progress Notes (Signed)
Order for neb machine ordered and printed. Nothing further is needed at this time.

## 2018-07-01 ENCOUNTER — Encounter: Payer: Self-pay | Admitting: Nutrition

## 2018-07-01 ENCOUNTER — Ambulatory Visit: Payer: Self-pay

## 2018-07-01 ENCOUNTER — Other Ambulatory Visit: Payer: Self-pay

## 2018-07-02 ENCOUNTER — Telehealth: Payer: Self-pay | Admitting: Medical Oncology

## 2018-07-02 NOTE — Telephone Encounter (Signed)
LVm to return call about order cancellation . I do not know if [employer] test is cancelled.

## 2018-07-04 NOTE — Progress Notes (Signed)
  Radiation Oncology         [phone] ________________________________  Name: CANDY ZIEGLER MRN: [id number]  Date: 06/17/2018  DOB: 03-21-1956  End of Treatment Note  Diagnosis:  62 y.o. 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 The patient then received a cone down boost treatment for an additional 6 Gy. This yielded a final total dose of 66 Gy.   Narrative: The patient tolerated radiation treatment relatively well.   The patient did not experience esophagitis during the course of treatment which required management. He did have increased fatigue and progressive hemoptysis. His skin was hyperpigmented, more noticeable on his back. He continues to use his Sonafine cream as prescribed.  Plan: The patient has completed radiation treatment. The patient will return to radiation oncology clinic for routine followup in one month. I advised the patient to call or return sooner if they have any questions or concerns related to their recovery or treatment. ________________________________  Jodelle Gross, MD, PhD  This document serves as a record of services personally performed by Kyung Rudd, MD. It was created on his behalf by Rae Lips, a trained medical scribe. The creation of this record is based on the scribe's personal observations and the provider's statements to them. This document has been checked and approved by the attending provider.

## 2018-07-08 ENCOUNTER — Telehealth: Payer: Self-pay | Admitting: Pulmonary Disease

## 2018-07-08 NOTE — Telephone Encounter (Signed)
Call made to patient, made aware his son requested that we mail him the nebulizer prescription. Patient states he will call his son. Nothing further is needed at this time.

## 2018-07-08 NOTE — Telephone Encounter (Signed)
Please see phone note from 06/25/18 looks like the son had called in wanted the rx sent to [facility] for the meds and the rx for neb machine to be mailed to him.

## 2018-07-08 NOTE — Telephone Encounter (Signed)
Pt states he has not heard about getting a nebulizer machine.  Order was placed on 11/5 to [facility], and it's documented in order note that [facility] would be working with pt to get machine out of pocket d/t no insurance coverage.  PCCs please advise on status of this order.  Thanks!

## 2018-07-10 ENCOUNTER — Telehealth: Payer: Self-pay | Admitting: Internal Medicine

## 2018-07-10 ENCOUNTER — Telehealth: Payer: Self-pay | Admitting: Emergency Medicine

## 2018-07-10 NOTE — Telephone Encounter (Addendum)
Patients daughter made aware of CT scan date/time.  ----- Message from Maryanna Shape, NP sent at 07/10/2018 12:55 PM EST ----- Regarding: CT scan Pt sees me on 12/18. Needs CT prior to the visit. This is not yet scheduled (has been authorized). Please call and get this scheduled and then notify pt of date/time. May need to call his son.

## 2018-07-10 NOTE — Telephone Encounter (Signed)
[place] out - moved 12/19 f/u to 12/18. Spoke with patient/son. Other appointments remain the same.

## 2018-07-12 ENCOUNTER — Telehealth: Payer: Self-pay | Admitting: Pulmonary Disease

## 2018-07-12 DIAGNOSIS — C3491 Malignant neoplasm of unspecified part of right bronchus or lung: Secondary | ICD-10-CM

## 2018-07-12 NOTE — Telephone Encounter (Signed)
Called and spoke with Warren Blankenship. He was wanting a prescription mailed to him for his Father.  He has a place in mind to get a neb machine. Offered to send it through a company for him. [place] declined.  Prescription printed and given to [place], [place], to have Dr. Valeta Harms to sign next week.  [address], [place] [id number]  Will route to [place] to follow up

## 2018-07-15 ENCOUNTER — Other Ambulatory Visit: Payer: Self-pay

## 2018-07-15 NOTE — Telephone Encounter (Signed)
Script for nebulizer machine has been signed and placed upfront for out going mail. Left message informing patient. Nothing further is needed at this time.

## 2018-07-16 ENCOUNTER — Inpatient Hospital Stay: Payer: Self-pay | Attending: Internal Medicine

## 2018-07-16 ENCOUNTER — Ambulatory Visit (HOSPITAL_COMMUNITY)
Admission: RE | Admit: 2018-07-16 | Discharge: 2018-07-16 | Disposition: A | Discharge status: Home / Self Care | Payer: Self-pay | Source: Ambulatory Visit | Attending: Oncology | Admitting: Oncology

## 2018-07-16 DIAGNOSIS — I2510 Atherosclerotic heart disease of native coronary artery without angina pectoris: Secondary | ICD-10-CM | POA: Insufficient documentation

## 2018-07-16 DIAGNOSIS — C3411 Malignant neoplasm of upper lobe, right bronchus or lung: Secondary | ICD-10-CM | POA: Insufficient documentation

## 2018-07-16 DIAGNOSIS — C3491 Malignant neoplasm of unspecified part of right bronchus or lung: Secondary | ICD-10-CM

## 2018-07-16 DIAGNOSIS — C771 Secondary and unspecified malignant neoplasm of intrathoracic lymph nodes: Secondary | ICD-10-CM | POA: Insufficient documentation

## 2018-07-16 DIAGNOSIS — R05 Cough: Secondary | ICD-10-CM | POA: Insufficient documentation

## 2018-07-16 DIAGNOSIS — R5383 Other fatigue: Secondary | ICD-10-CM | POA: Insufficient documentation

## 2018-07-16 DIAGNOSIS — Z5112 Encounter for antineoplastic immunotherapy: Secondary | ICD-10-CM | POA: Insufficient documentation

## 2018-07-16 DIAGNOSIS — Z79899 Other long term (current) drug therapy: Secondary | ICD-10-CM | POA: Insufficient documentation

## 2018-07-16 DIAGNOSIS — Z87891 Personal history of nicotine dependence: Secondary | ICD-10-CM | POA: Insufficient documentation

## 2018-07-16 LAB — CBC WITH DIFFERENTIAL (CANCER CENTER ONLY)
ABS IMMATURE GRANULOCYTES: 0.01 10*3/uL (ref 0.00–0.07)
Basophils Absolute: 0 10*3/uL (ref 0.0–0.1)
Basophils Relative: 1 %
Eosinophils Absolute: 0.1 10*3/uL (ref 0.0–0.5)
Eosinophils Relative: 3 %
HCT: 32.6 % — ABNORMAL LOW (ref 39.0–52.0)
Hemoglobin: 10 g/dL — ABNORMAL LOW (ref 13.0–17.0)
Immature Granulocytes: 0 %
Lymphocytes Relative: 20 %
Lymphs Abs: 0.7 10*3/uL (ref 0.7–4.0)
MCH: 28 pg (ref 26.0–34.0)
MCHC: 30.7 g/dL (ref 30.0–36.0)
MCV: 91.3 fL (ref 80.0–100.0)
Monocytes Absolute: 0.5 10*3/uL (ref 0.1–1.0)
Monocytes Relative: 13 %
NEUTROS ABS: 2.1 10*3/uL (ref 1.7–7.7)
Neutrophils Relative %: 63 %
Platelet Count: 366 10*3/uL (ref 150–400)
RBC: 3.57 MIL/uL — ABNORMAL LOW (ref 4.22–5.81)
RDW: 19.1 % — ABNORMAL HIGH (ref 11.5–15.5)
WBC Count: 3.4 10*3/uL — ABNORMAL LOW (ref 4.0–10.5)
nRBC: 0 % (ref 0.0–0.2)

## 2018-07-16 LAB — CMP (CANCER CENTER ONLY)
ALT: 12 U/L (ref 0–44)
AST: 17 U/L (ref 15–41)
Albumin: 2.5 g/dL — ABNORMAL LOW (ref 3.5–5.0)
Alkaline Phosphatase: 98 U/L (ref 38–126)
Anion gap: 9 (ref 5–15)
BUN: 6 mg/dL — AB (ref 8–23)
CO2: 26 mmol/L (ref 22–32)
Calcium: 9.4 mg/dL (ref 8.9–10.3)
Chloride: 99 mmol/L (ref 98–111)
Creatinine: 0.64 mg/dL (ref 0.61–1.24)
GFR, Est AFR Am: >60 mL/min (ref >60)
GFR, Estimated: >60 mL/min (ref >60)
Glucose, Bld: 72 mg/dL (ref 70–99)
POTASSIUM: 4.2 mmol/L (ref 3.5–5.1)
Sodium: 134 mmol/L — ABNORMAL LOW (ref 135–145)
Total Bilirubin: 0.4 mg/dL (ref 0.3–1.2)
Total Protein: 7.2 g/dL (ref 6.5–8.1)

## 2018-07-16 MED ORDER — SODIUM CHLORIDE (PF) 0.9 % IJ SOLN
INTRAMUSCULAR | Status: AC
  Filled 2018-07-16: qty 50

## 2018-07-16 MED ORDER — IOHEXOL 300 MG/ML  SOLN
75.0000 mL | Freq: Once | INTRAMUSCULAR | Status: AC | PRN
  Administered 2018-07-16: 75 mL via INTRAVENOUS

## 2018-07-17 ENCOUNTER — Inpatient Hospital Stay (HOSPITAL_BASED_OUTPATIENT_CLINIC_OR_DEPARTMENT_OTHER): Payer: Self-pay | Admitting: Oncology

## 2018-07-17 ENCOUNTER — Telehealth: Payer: Self-pay

## 2018-07-17 ENCOUNTER — Other Ambulatory Visit: Payer: Self-pay | Admitting: Internal Medicine

## 2018-07-17 ENCOUNTER — Encounter: Payer: Self-pay | Admitting: Oncology

## 2018-07-17 VITALS — BP 84/60 | HR 104 | Temp 98.2°F | Resp 14 | Ht 67.0 in | Wt 97.9 lb

## 2018-07-17 DIAGNOSIS — C3411 Malignant neoplasm of upper lobe, right bronchus or lung: Secondary | ICD-10-CM

## 2018-07-17 DIAGNOSIS — Z87891 Personal history of nicotine dependence: Secondary | ICD-10-CM

## 2018-07-17 DIAGNOSIS — Z79899 Other long term (current) drug therapy: Secondary | ICD-10-CM

## 2018-07-17 DIAGNOSIS — I2510 Atherosclerotic heart disease of native coronary artery without angina pectoris: Secondary | ICD-10-CM

## 2018-07-17 DIAGNOSIS — R5383 Other fatigue: Secondary | ICD-10-CM

## 2018-07-17 DIAGNOSIS — Z7189 Other specified counseling: Secondary | ICD-10-CM

## 2018-07-17 DIAGNOSIS — C3491 Malignant neoplasm of unspecified part of right bronchus or lung: Secondary | ICD-10-CM

## 2018-07-17 DIAGNOSIS — R05 Cough: Secondary | ICD-10-CM

## 2018-07-17 DIAGNOSIS — C771 Secondary and unspecified malignant neoplasm of intrathoracic lymph nodes: Secondary | ICD-10-CM

## 2018-07-17 DIAGNOSIS — C3401 Malignant neoplasm of right main bronchus: Secondary | ICD-10-CM

## 2018-07-17 DIAGNOSIS — Z5112 Encounter for antineoplastic immunotherapy: Secondary | ICD-10-CM | POA: Insufficient documentation

## 2018-07-17 NOTE — Telephone Encounter (Signed)
Printed avs and calender of upcoming appointment. Per 12/18 los

## 2018-07-17 NOTE — Progress Notes (Signed)
[facility] OFFICE PROGRESS NOTE  Patient, No Pcp Per No address on file  DIAGNOSIS:Stage IIIB (T4, N2, M0)) non-small cell lung cancer, poorly differentiated carcinoma favoring adenocarcinoma presented with large right hilar and upper lobe lung mass with right hilar and mediastinal lymphadenopathy and compression of right mainstem bronchus as well as right middle lobe bronchus diagnosed in September 2019.  PRIOR THERAPY: Concurrent chemoradiation with weekly carboplatin for AUC of 2 and paclitaxel 45MG/M2. First dose May 14, 2018.Status post6cycles.  CURRENT THERAPY: Consolidation treatment with immunotherapy with Imfinzi (Durvalumab) 10 mg/KG every 2 weeks.  First dose expected on 07/29/2018.  INTERVAL HISTORY: Warren Blankenship 62 y.o. male returns for routine follow-up visit accompanied by his son.  The patient is feeling fine today and has no specific complaints.  He denies fevers and chills.  Denies chest pain, shortness of breath, hemoptysis.  Positive for cough which is unchanged from baseline.  Denies nausea, vomiting, constipation, diarrhea.  He reports that he has good appetite and has gained 1 pound since his last visit.  Denies night sweats.  The patient is here for evaluation and discussion of his CT scan results.  MEDICAL HISTORY: Past Medical History:  Diagnosis Date  . Mass of right lung   . Tobacco abuse     ALLERGIES:  has No Known Allergies.  MEDICATIONS:  Current Outpatient Medications  Medication Sig Dispense Refill  . acetaminophen (TYLENOL) 325 MG tablet Take 2 tablets (650 mg total) by mouth every 6 (six) hours as needed for mild pain (or Fever >/= 101).    Marland Kitchen albuterol (PROVENTIL) (2.5 MG/3ML) 0.083% nebulizer solution Take 3 mLs (2.5 mg total) by nebulization every 4 (four) hours as needed for wheezing or shortness of breath. 75 mL 5  . lactose free nutrition (BOOST PLUS) LIQD Take 237 mLs by mouth 3 (three) times daily between meals.  90 Can 0  . Multiple Vitamin (MULTIVITAMIN WITH MINERALS) TABS tablet Take 1 tablet by mouth daily.    . polyethylene glycol (MIRALAX / GLYCOLAX) packet Take 17 g by mouth daily as needed for mild constipation. (Patient not taking: Reported on 07/17/2018) 14 each 0  . prochlorperazine (COMPAZINE) 10 MG tablet TAKE 1 TABLET BY MOUTH EVERY 6 HOURS AS NEEDED FOR NAUSEA FOR VOMITING (Patient not taking: Reported on 07/17/2018) 30 tablet 0  . umeclidinium-vilanterol (ANORO ELLIPTA) 62.5-25 MCG/INH AEPB Inhale 1 puff into the lungs daily. (Patient not taking: Reported on 06/24/2018) 60 each 0   No current facility-administered medications for this visit.     SURGICAL HISTORY:  Past Surgical History:  Procedure Laterality Date  . BIOPSY  04/29/2018   Procedure: BIOPSY;  Surgeon: Garner Nash, DO;  Location: [facility];  Service: Cardiopulmonary;;  . BRONCHIAL WASHINGS  04/29/2018   Procedure: BRONCHIAL WASHINGS;  Surgeon: Garner Nash, DO;  Location: [facility];  Service: Cardiopulmonary;;  . ENDOBRONCHIAL ULTRASOUND Bilateral 04/29/2018   Procedure: ENDOBRONCHIAL ULTRASOUND;  Surgeon: Garner Nash, DO;  Location: [facility];  Service: Cardiopulmonary;  Laterality: Bilateral;  . Endobronchial Ultrasound (EBUS), Mediastinum  Bilateral 04/29/2018   Dr. Valeta Harms   . FINE NEEDLE ASPIRATION BIOPSY  04/29/2018   Procedure: FINE NEEDLE ASPIRATION BIOPSY;  Surgeon: Garner Nash, DO;  Location: [facility];  Service: Cardiopulmonary;;  . FLEXIBLE BRONCHOSCOPY  04/29/2018   Procedure: FLEXIBLE BRONCHOSCOPY;  Surgeon: Garner Nash, DO;  Location: [facility];  Service: Cardiopulmonary;;  . TONSILLECTOMY      REVIEW OF SYSTEMS:  Review of Systems  Constitutional: Negative for appetite change, chills, fatigue, fever and unexpected weight change.  HENT:   Negative for mouth sores, nosebleeds, sore throat and trouble swallowing.   Eyes: Negative for eye problems and icterus.   Respiratory: Negative for hemoptysis, shortness of breath and wheezing.  Positive for cough. Cardiovascular: Negative for chest pain and leg swelling.  Gastrointestinal: Negative for abdominal pain, constipation, diarrhea, nausea and vomiting.  Genitourinary: Negative for bladder incontinence, difficulty urinating, dysuria, frequency and hematuria.   Musculoskeletal: Negative for back pain, gait problem, neck pain and neck stiffness.  Skin: Negative for itching and rash.  Neurological: Negative for dizziness, extremity weakness, gait problem, headaches, light-headedness and seizures.  Hematological: Negative for adenopathy. Does not bruise/bleed easily.  Psychiatric/Behavioral: Negative for confusion, depression and sleep disturbance. The patient is not nervous/anxious.     PHYSICAL EXAMINATION:  Blood pressure (!) 84/60, pulse (!) 104, temperature 98.2 F (36.8 C), temperature source Oral, resp. rate 14, height _0  (1.702 m), weight 97 lb 14.4 oz (44.4 kg), SpO2 100 %.  ECOG PERFORMANCE STATUS: 1 - Symptomatic but completely ambulatory  Physical Exam  Constitutional: Oriented to person, place, and time and well-developed, well-nourished, and in no distress. No distress.  HENT:  Head: Normocephalic and atraumatic.  Mouth/Throat: Oropharynx is clear and moist. No oropharyngeal exudate.  Eyes: Conjunctivae are normal. Right eye exhibits no discharge. Left eye exhibits no discharge. No scleral icterus.  Neck: Normal range of motion. Neck supple.  Cardiovascular: Normal rate, regular rhythm, normal heart sounds and intact distal pulses.   Pulmonary/Chest: Effort normal and breath sounds normal. No respiratory distress. No wheezes. No rales.  Abdominal: Soft. Bowel sounds are normal. Exhibits no distension and no mass. There is no tenderness.  Musculoskeletal: Normal range of motion. Exhibits no edema.  Lymphadenopathy:    No cervical adenopathy.  Neurological: Alert and oriented to  person, place, and time. Exhibits normal muscle tone. Gait normal. Coordination normal.  Skin: Skin is warm and dry. No rash noted. Not diaphoretic. No erythema. No pallor.  Psychiatric: Mood, memory and judgment normal.  Vitals reviewed.  LABORATORY DATA: Lab Results  Component Value Date   WBC 3.4 (L) 07/16/2018   HGB 10.0 (L) 07/16/2018   HCT 32.6 (L) 07/16/2018   MCV 91.3 07/16/2018   PLT 366 07/16/2018      Chemistry      Component Value Date/Time   NA 134 (L) 07/16/2018 1308   K 4.2 07/16/2018 1308   CL 99 07/16/2018 1308   CO2 26 07/16/2018 1308   BUN 6 (L) 07/16/2018 1308   CREATININE 0.64 07/16/2018 1308      Component Value Date/Time   CALCIUM 9.4 07/16/2018 1308   ALKPHOS 98 07/16/2018 1308   AST 17 07/16/2018 1308   ALT 12 07/16/2018 1308   BILITOT 0.4 07/16/2018 1308       RADIOGRAPHIC STUDIES:  Ct Chest W Contrast  Result Date: 07/17/2018 CLINICAL DATA:  Stage IIIB non-small cell lung cancer EXAM: CT CHEST WITH CONTRAST TECHNIQUE: Multidetector CT imaging of the chest was performed during intravenous contrast administration. CONTRAST:  64m OMNIPAQUE IOHEXOL 300 MG/ML  SOLN COMPARISON:  PET-CT dated 05/10/2018.  CT chest dated 04/12/2018. FINDINGS: Cardiovascular: The heart is normal in size. Small volume anterior pericardial effusion. No evidence of thoracic aortic aneurysm. Mild atherosclerotic calcifications of the aortic arch. Coronary atherosclerosis of the LAD. Mediastinum/Nodes: Bulky thoracic nodal metastases, improved from prior CT chest, including: --3.0 cm short axis right  paratracheal node (series 2/image 87), previously 3.8 cm --2.7 cm short axis low right paratracheal node (series 2/image 87), previously 4.0 cm when measured discretely --2.5 cm short axis right hilar node (series 2/image 67), previously 3.5 cm when measured discretely Visualized thyroid is unremarkable. Lungs/Pleura: Right hilar/suprahilar mass described above leads to narrowing of  right upper lobe bronchi with peribronchial thickening and postobstructive nodularity in the medial right upper lobe (series 7/image 77). This appearance is similar to the prior. Mild centrilobular and paraseptal emphysematous changes, upper lobe predominant. No focal consolidation. No pleural effusion or pneumothorax. Upper Abdomen: Visualized upper abdomen is grossly unremarkable. Musculoskeletal: Visualized osseous structures are within normal limits. IMPRESSION: Improving bulky thoracic lymphadenopathy, as above. Mild postobstructive opacity in the suprahilar region/medial right upper lobe, similar. Aortic Atherosclerosis (ICD10-I70.0) and Emphysema (ICD10-J43.9). Electronically Signed   By: Julian Hy M.D.   On: 07/17/2018 08:24     ASSESSMENT/PLAN:  Stage III squamous cell carcinoma of right lung [facility]) This is a very pleasant 63 year old white male recently diagnosed with a stage IIIb non-small cell lung cancer.  The patient received a course ofconcurrent chemoradiation with weekly carboplatin for AUC of 2 and paclitaxel 45 MG/M2. Status post 6 cycles which he tolerated fairly well.  He had a restaging CT scan of the chest and is here to discuss the results.  The patient was seen with Dr. Julien Nordmann.  CT scan results were discussed with the patient and his son which showed improvement of his disease. Discussed with the patient options for management of his condition at this point including close observation and monitoring versus consolidation treatment with immunotherapy with Imfinzi (Durvalumab) 10 MG/KG every 2 weeks for a total of one year unless the patient developed toxicity or disease progression. The patient is interested in the treatment with consolidation Imfinzi (Durvalumab). Discussed with him the adverse effects of this treatment including but not limited to immunotherapy mediated skin rash, diarrhea, or inflammation of the lung, kidney, liver, thyroid or other endocrine dysfunction  including type 1 diabetes mellitus. The patient would like to proceed with treatment as planned and he is expected to start the first cycle of this treatment on 07/29/2018. We will see the patient back for follow-up in approximately 4 weeks for evaluation prior to cycle #2.  He was advised to call immediately if he has any concerning symptoms in the interval. The patient voices understanding of current disease status and treatment options and is in agreement with the current care plan.  All questions were answered. The patient knows to call the clinic with any problems, questions or concerns. We can certainly see the patient much sooner if necessary.   Orders Placed This Encounter  Procedures  . CBC with Differential (Cancer Center Only)    Standing Status:   Standing    Number of Occurrences:   20    Standing Expiration Date:   07/18/2019  . CMP ([facility] only)    Standing Status:   Standing    Number of Occurrences:   20    Standing Expiration Date:   07/18/2019  . TSH    Standing Status:   Standing    Number of Occurrences:   12    Standing Expiration Date:   07/18/2019     [name], DNP, AGPCNP-BC, AOCNP 07/17/18   ADDENDUM: Hematology/Oncology Attending: I had a face-to-face encounter with the patient.  I recommended his care plan.  This is a very pleasant 62 years old white  male with a stage IIIb non-small cell lung cancer.  The patient underwent a course of concurrent chemoradiation with weekly carboplatin and paclitaxel.  He tolerated this course of treatment well except for fatigue and mild cough. He had repeat CT scan of the chest performed recently.  I personally and independently reviewed the scan and discussed the results with the patient and his son.  His scan showed improvement of his disease consistent with partial response. I had a lengthy discussion with the patient and his son about his treatment options.  I gave the patient the option of treatment with  consolidation immunotherapy with Imfinzi 10 mg/KG every 2 weeks for a total of 1 year if he has no evidence for disease progression or unacceptable toxicity versus continuous observation and monitoring.  The patient and his son are interested in proceeding with the treatment.  We discussed with him the adverse effect of this treatment including but not limited to immunotherapy mediated skin rash, diarrhea, inflammation of the lung, kidney, liver, thyroid or other endocrine dysfunction. The patient is expected to start the first cycle of this treatment after Christmas. We will see him back for follow-up visit in 3 weeks for evaluation with the start of cycle #2. He was advised to call immediately if he has any concerning symptoms in the interval.  Disclaimer: This note was dictated with voice recognition software. Similar sounding words can inadvertently be transcribed and may be missed upon review. Eilleen Kempf, MD 07/17/18

## 2018-07-17 NOTE — Assessment & Plan Note (Addendum)
This is a very pleasant 62 year old white male recently diagnosed with a stage IIIb non-small cell lung cancer.  The patient received a course ofconcurrent chemoradiation with weekly carboplatin for AUC of 2 and paclitaxel 45 MG/M2. Status post 6 cycles which he tolerated fairly well.  He had a restaging CT scan of the chest and is here to discuss the results.  The patient was seen with Dr. Julien Nordmann.  CT scan results were discussed with the patient and his son which showed improvement of his disease. Discussed with the patient options for management of his condition at this point including close observation and monitoring versus consolidation treatment with immunotherapy with Imfinzi (Durvalumab) 10 MG/KG every 2 weeks for a total of one year unless the patient developed toxicity or disease progression. The patient is interested in the treatment with consolidation Imfinzi (Durvalumab). Discussed with him the adverse effects of this treatment including but not limited to immunotherapy mediated skin rash, diarrhea, or inflammation of the lung, kidney, liver, thyroid or other endocrine dysfunction including type 1 diabetes mellitus. The patient would like to proceed with treatment as planned and he is expected to start the first cycle of this treatment on 07/29/2018. We will see the patient back for follow-up in approximately 4 weeks for evaluation prior to cycle #2.  He was advised to call immediately if he has any concerning symptoms in the interval. The patient voices understanding of current disease status and treatment options and is in agreement with the current care plan.  All questions were answered. The patient knows to call the clinic with any problems, questions or concerns. We can certainly see the patient much sooner if necessary.

## 2018-07-17 NOTE — Progress Notes (Signed)
DISCONTINUE ON PATHWAY REGIMEN - Non-Small Cell Lung     Administer weekly:     Paclitaxel      Carboplatin   **Always confirm dose/schedule in your pharmacy ordering system**  REASON: Continuation Of Treatment PRIOR TREATMENT: [id number]: Carboplatin AUC=2 + Paclitaxel 45 mg/m2 Weekly During Radiation TREATMENT RESPONSE: Partial Response (PR)  START ON PATHWAY REGIMEN - Non-Small Cell Lung     A cycle is every 14 days:     Durvalumab   **Always confirm dose/schedule in your pharmacy ordering system**  Patient Characteristics: Stage III - Unresectable, PS = 0, 1 AJCC T Category: T4 Current Disease Status: No Distant Mets or Local Recurrence AJCC N Category: N2 AJCC M Category: M0 AJCC 8 Stage Grouping: IIIB Performance Status: PS = 0, 1 Intent of Therapy: Non-Curative / Palliative Intent, Discussed with Patient

## 2018-07-17 NOTE — Patient Instructions (Signed)
Durvalumab injection What is this medicine? DURVALUMAB (dur VAL ue mab) is a monoclonal antibody. It is used to treat urothelial cancer and lung cancer. This medicine may be used for other purposes; ask your health care provider or pharmacist if you have questions. COMMON BRAND NAME(S): IMFINZI What should I tell my health care provider before I take this medicine? They need to know if you have any of these conditions: -diabetes -immune system problems -infection -inflammatory bowel disease -kidney disease -liver disease -lung or breathing disease -lupus -organ transplant -stomach or intestine problems -thyroid disease -an unusual or allergic reaction to durvalumab, other medicines, foods, dyes, or preservatives -pregnant or trying to get pregnant -breast-feeding How should I use this medicine? This medicine is for infusion into a vein. It is given by a health care professional in a hospital or clinic setting. A special MedGuide will be given to you before each treatment. Be sure to read this information carefully each time. Talk to your pediatrician regarding the use of this medicine in children. Special care may be needed. Overdosage: If you think you have taken too much of this medicine contact a poison control center or emergency room at once. NOTE: This medicine is only for you. Do not share this medicine with others. What if I miss a dose? It is important not to miss your dose. Call your doctor or health care professional if you are unable to keep an appointment. What may interact with this medicine? Interactions have not been studied. This list may not describe all possible interactions. Give your health care provider a list of all the medicines, herbs, non-prescription drugs, or dietary supplements you use. Also tell them if you smoke, drink alcohol, or use illegal drugs. Some items may interact with your medicine. What should I watch for while using this medicine? This drug  may make you feel generally unwell. Continue your course of treatment even though you feel ill unless your doctor tells you to stop. You may need blood work done while you are taking this medicine. Do not become pregnant while taking this medicine or for 3 months after stopping it. Women should inform their doctor if they wish to become pregnant or think they might be pregnant. There is a potential for serious side effects to an unborn child. Talk to your health care professional or pharmacist for more information. Do not breast-feed an infant while taking this medicine or for 3 months after stopping it. What side effects may I notice from receiving this medicine? Side effects that you should report to your doctor or health care professional as soon as possible: -allergic reactions like skin rash, itching or hives, swelling of the face, lips, or tongue -black, tarry stools -bloody or watery diarrhea -breathing problems -change in emotions or moods -change in sex drive -changes in vision -chest pain or chest tightness -chills -confusion -cough -facial flushing -fever -headache -signs and symptoms of high blood sugar such as dizziness; dry mouth; dry skin; fruity breath; nausea; stomach pain; increased hunger or thirst; increased urination -signs and symptoms of liver injury like dark yellow or brown urine; general ill feeling or flu-like symptoms; light-colored stools; loss of appetite; nausea; right upper belly pain; unusually weak or tired; yellowing of the eyes or skin -stomach pain -trouble passing urine or change in the amount of urine -weight gain or weight loss Side effects that usually do not require medical attention (report these to your doctor or health care professional if they continue or are   bothersome): -bone pain -constipation -loss of appetite -muscle pain -nausea -swelling of the ankles, feet, hands -tiredness This list may not describe all possible side effects. Call  your doctor for medical advice about side effects. You may report side effects to FDA at 1-800-FDA-1088. Where should I keep my medicine? This drug is given in a hospital or clinic and will not be stored at home. NOTE: This sheet is a summary. It may not cover all possible information. If you have questions about this medicine, talk to your doctor, pharmacist, or health care provider.  2019 Elsevier/Gold Standard (2016-09-26 19:25:04)

## 2018-07-18 ENCOUNTER — Ambulatory Visit: Payer: Self-pay | Admitting: Oncology

## 2018-07-22 ENCOUNTER — Ambulatory Visit
Admission: RE | Admit: 2018-07-22 | Discharge: 2018-07-22 | Disposition: A | Discharge status: Home / Self Care | Payer: Self-pay | Source: Ambulatory Visit | Attending: Radiation Oncology | Admitting: Radiation Oncology

## 2018-07-22 ENCOUNTER — Other Ambulatory Visit: Payer: Self-pay

## 2018-07-22 ENCOUNTER — Encounter: Payer: Self-pay | Admitting: Radiation Oncology

## 2018-07-22 VITALS — BP 96/74 | HR 116 | Temp 98.3°F | Resp 20 | Ht 67.0 in | Wt 99.2 lb

## 2018-07-22 DIAGNOSIS — R000 Tachycardia, unspecified: Secondary | ICD-10-CM | POA: Insufficient documentation

## 2018-07-22 DIAGNOSIS — C3490 Malignant neoplasm of unspecified part of unspecified bronchus or lung: Secondary | ICD-10-CM

## 2018-07-22 DIAGNOSIS — R590 Localized enlarged lymph nodes: Secondary | ICD-10-CM | POA: Insufficient documentation

## 2018-07-22 DIAGNOSIS — C342 Malignant neoplasm of middle lobe, bronchus or lung: Secondary | ICD-10-CM | POA: Insufficient documentation

## 2018-07-22 DIAGNOSIS — I959 Hypotension, unspecified: Secondary | ICD-10-CM | POA: Insufficient documentation

## 2018-07-22 DIAGNOSIS — I313 Pericardial effusion (noninflammatory): Secondary | ICD-10-CM | POA: Insufficient documentation

## 2018-07-22 DIAGNOSIS — Z79899 Other long term (current) drug therapy: Secondary | ICD-10-CM | POA: Insufficient documentation

## 2018-07-22 DIAGNOSIS — Z923 Personal history of irradiation: Secondary | ICD-10-CM | POA: Insufficient documentation

## 2018-07-22 NOTE — Progress Notes (Signed)
  Radiation Oncology         [phone] ________________________________  Name: Warren Blankenship MRN: [id number]  Date: 04/30/2018  DOB: 1956/01/19  RESPIRATORY MOTION MANAGEMENT SIMULATION  NARRATIVE:  In order to account for effect of respiratory motion on target structures and other organs in the planning and delivery of radiotherapy, this patient underwent respiratory motion management simulation.  To accomplish this, when the patient was brought to the CT simulation planning suite, 4D respiratoy motion management CT images were obtained.  The CT images were loaded into the planning software.  Then, using a variety of tools including Cine, MIP, and standard views, the target volume and planning target volumes (PTV) were delineated.  Avoidance structures were contoured.  Treatment planning then occurred.  Dose volume histograms were generated and reviewed for each of the requested structure.  The resulting plan was carefully reviewed and approved today.   ------------------------------------------------  Jodelle Gross, MD, PhD

## 2018-07-22 NOTE — Progress Notes (Signed)
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 The patient then received a cone down boost treatment for an additional 6 Gy. This yielded a final total dose of 66 Gy.   Narrative:  The patient returns today for routine follow-up. In summary this is a pleasant patient who was met during his inpatient hospitalization at the time of his diagnosis and work up. He completed a definitive course of radiotherapy and tolerated this well overall. He has been underweight since we originally met and has been able to get back to eating well since completing radiotherapy. He reports he has been having some mild tachycardia after taking nebulizer treatments. He denies any concentrated urine, or hematuria. He denies any progressive symptoms of weakness and reports he gets along well with a walker. He denies shortness of breath at rest, or with walking in today but occasional does get short winded with exertion. He denies any chest pain, fevers, chills, or purulent productive mucous. He denies any edema of his neck or chest wall or RUE. No other complaints are noted.                        ALLERGIES:  has No Known Allergies.  Meds: Current Outpatient Medications  Medication Sig Dispense Refill  . acetaminophen (TYLENOL) 325 MG tablet Take 2 tablets (650 mg total) by mouth every 6 (six) hours as needed for mild pain (or Fever >/= 101).    Marland Kitchen albuterol (PROVENTIL) (2.5 MG/3ML) 0.083% nebulizer solution Take 3 mLs (2.5 mg total) by  nebulization every 4 (four) hours as needed for wheezing or shortness of breath. 75 mL 5  . lactose free nutrition (BOOST PLUS) LIQD Take 237 mLs by mouth 3 (three) times daily between meals. 90 Can 0  . Multiple Vitamin (MULTIVITAMIN WITH MINERALS) TABS tablet Take 1 tablet by mouth daily.    . polyethylene glycol (MIRALAX / GLYCOLAX) packet Take 17 g by mouth daily as needed for mild constipation. 14 each 0  . prochlorperazine (COMPAZINE) 10 MG tablet TAKE 1 TABLET BY MOUTH EVERY 6 HOURS AS NEEDED FOR NAUSEA FOR VOMITING (Patient not taking: Reported on 07/17/2018) 30 tablet 0  . umeclidinium-vilanterol (ANORO ELLIPTA) 62.5-25 MCG/INH AEPB Inhale 1 puff into the lungs daily. (Patient not taking: Reported on 06/24/2018) 60 each 0   No current facility-administered medications for this encounter.     Physical Findings:  height is '5\' 7"'  (1.702 m) and weight is 99 lb 3.2 oz (45 kg). His oral temperature is 98.3 F (36.8 C). His blood pressure is 96/74 and his pulse is 116 (abnormal). His respiration is 20 and oxygen saturation is 100%.  Pain Assessment Pain Score: 0-No pain/10 In general this is a thin, chronically ill appearing caucasian male in no acute distress. He's alert and oriented x4 and appropriate throughout the examination. Cardiopulmonary assessment is negative for acute distress and he exhibits normal effort. No edema of the RUE or neck are noted.  Lab Findings: Lab Results  Component Value Date   WBC 3.4 (L) 07/16/2018   HGB 10.0 (L) 07/16/2018   HCT 32.6 (L) 07/16/2018   MCV 91.3 07/16/2018   PLT 366 07/16/2018     Radiographic Findings: Ct Chest W Contrast  Result Date: 07/17/2018 CLINICAL DATA:  Stage IIIB non-small cell lung cancer EXAM: CT CHEST WITH CONTRAST TECHNIQUE: Multidetector CT imaging of the chest was performed during intravenous contrast administration. CONTRAST:  7m OMNIPAQUE IOHEXOL 300 MG/ML  SOLN COMPARISON:  PET-CT dated 05/10/2018.  CT chest dated  04/12/2018. FINDINGS: Cardiovascular: The heart is normal in size. Small volume anterior pericardial effusion. No evidence of thoracic aortic aneurysm. Mild atherosclerotic calcifications of the aortic arch. Coronary atherosclerosis of the LAD. Mediastinum/Nodes: Bulky thoracic nodal metastases, improved from prior CT chest, including: --3.0 cm short axis right paratracheal node (series 2/image 87), previously 3.8 cm --2.7 cm short axis low right paratracheal node (series 2/image 87), previously 4.0 cm when measured discretely --2.5 cm short axis right hilar node (series 2/image 67), previously 3.5 cm when measured discretely Visualized thyroid is unremarkable. Lungs/Pleura: Right hilar/suprahilar mass described above leads to narrowing of right upper lobe bronchi with peribronchial thickening and postobstructive nodularity in the medial right upper lobe (series 7/image 77). This appearance is similar to the prior. Mild centrilobular and paraseptal emphysematous changes, upper lobe predominant. No focal consolidation. No pleural effusion or pneumothorax. Upper Abdomen: Visualized upper abdomen is grossly unremarkable. Musculoskeletal: Visualized osseous structures are within normal limits. IMPRESSION: Improving bulky thoracic lymphadenopathy, as above. Mild postobstructive opacity in the suprahilar region/medial right upper lobe, similar. Aortic Atherosclerosis (ICD10-I70.0) and Emphysema (ICD10-J43.9). Electronically Signed   By: SJulian HyM.D.   On: 07/17/2018 08:24    Impression/Plan: 1. 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  The patient is due for repeat evaluation with Dr. MJulien Nordmannand infusion and we will follow him as needed. He was given parameters to call or if he had concerns about his prior radiotherapy. 2. Hypotension. The patient is asymptomatic and this is likely multifactorial. I encouraged him to increase oral intake of fluids and he will  let u[place]ore[place]now if he starts having any progressive symptoms. He is going to have his BP checked at the pharmacy if he is feeling poorly and I encouraged him to try and obtain a BP cuff for home use.      ACarola Rhine PAC

## 2018-07-22 NOTE — Progress Notes (Signed)
  Radiation Oncology         [phone] ________________________________  Name: Warren Blankenship MRN: [id number]  Date: 04/30/2018  DOB: 06-29-1956  SIMULATION AND TREATMENT PLANNING NOTE  DIAGNOSIS:     ICD-10-CM   1. Malignant neoplasm of middle lobe, bronchus or lung ([place]) C34.2      Site:  chest  NARRATIVE:  The patient was brought to the [facility].  Identity was confirmed.  All relevant records and images related to the planned course of therapy were reviewed.   Written consent to proceed with treatment was confirmed which was freely given after reviewing the details related to the planned course of therapy had been reviewed with the patient.  Then, the patient was set-up in a stable reproducible  supine position for radiation therapy.  CT images were obtained.  Surface markings were placed.    Medically necessary complex treatment device(s) for immobilization:  Vac-lock bag.   The CT images were loaded into the planning software.  Then the target and avoidance structures were contoured.  Treatment planning then occurred.  The radiation prescription was entered and confirmed.  A total of 5 complex treatment devices were fabricated which relate to the designed radiation treatment fields. Additional reduced fields will be used as necessary to improve the dose homogeneity of the plan. Each of these customized fields/ complex treatment devices will be used on a daily basis during the radiation course. I have requested : 3D Simulation  I have requested a DVH of the following structures: target volume, spinal cord, lungs, heart.   The patient will undergo daily image guidance to ensure accurate localization of the target, and adequate minimize dose to the normal surrounding structures in close proximity to the target.  PLAN:  The patient will receive 60 Gy in 30 fractions initially. The patient will then receive a 6 Gy boost for a final dose of 66 Gy.   Special treatment  procedure The patient will also receive concurrent chemotherapy during the treatment. The patient may therefore experience increased toxicity or side effects and the patient will be monitored for such problems. This may require extra lab work as necessary. This therefore constitutes a special treatment procedure.   ________________________________   Jodelle Gross, MD, PhD

## 2018-07-29 ENCOUNTER — Other Ambulatory Visit: Payer: Self-pay | Admitting: Internal Medicine

## 2018-07-29 ENCOUNTER — Inpatient Hospital Stay: Payer: Self-pay

## 2018-07-29 VITALS — BP 97/56 | HR 99 | Temp 98.4°F | Resp 12 | Wt 101.2 lb

## 2018-07-29 DIAGNOSIS — C3491 Malignant neoplasm of unspecified part of right bronchus or lung: Secondary | ICD-10-CM

## 2018-07-29 DIAGNOSIS — Z5112 Encounter for antineoplastic immunotherapy: Secondary | ICD-10-CM

## 2018-07-29 LAB — CBC WITH DIFFERENTIAL (CANCER CENTER ONLY)
Abs Immature Granulocytes: 0.01 10*3/uL (ref 0.00–0.07)
BASOS ABS: 0.1 10*3/uL (ref 0.0–0.1)
Basophils Relative: 1 %
Eosinophils Absolute: 0.1 10*3/uL (ref 0.0–0.5)
Eosinophils Relative: 2 %
HCT: 35.3 % — ABNORMAL LOW (ref 39.0–52.0)
Hemoglobin: 10.6 g/dL — ABNORMAL LOW (ref 13.0–17.0)
Immature Granulocytes: 0 %
Lymphocytes Relative: 19 %
Lymphs Abs: 0.9 10*3/uL (ref 0.7–4.0)
MCH: 28.5 pg (ref 26.0–34.0)
MCHC: 30 g/dL (ref 30.0–36.0)
MCV: 94.9 fL (ref 80.0–100.0)
Monocytes Absolute: 0.6 10*3/uL (ref 0.1–1.0)
Monocytes Relative: 12 %
NEUTROS ABS: 3.1 10*3/uL (ref 1.7–7.7)
Neutrophils Relative %: 66 %
PLATELETS: 255 10*3/uL (ref 150–400)
RBC: 3.72 MIL/uL — AB (ref 4.22–5.81)
RDW: 20.1 % — ABNORMAL HIGH (ref 11.5–15.5)
WBC Count: 4.7 10*3/uL (ref 4.0–10.5)
nRBC: 0 % (ref 0.0–0.2)

## 2018-07-29 LAB — CMP (CANCER CENTER ONLY)
ALBUMIN: 2.9 g/dL — AB (ref 3.5–5.0)
ALT: 10 U/L (ref 0–44)
AST: 15 U/L (ref 15–41)
Alkaline Phosphatase: 84 U/L (ref 38–126)
Anion gap: 10 (ref 5–15)
BUN: 9 mg/dL (ref 8–23)
CHLORIDE: 100 mmol/L (ref 98–111)
CO2: 25 mmol/L (ref 22–32)
Calcium: 9.5 mg/dL (ref 8.9–10.3)
Creatinine: 0.68 mg/dL (ref 0.61–1.24)
GFR, Est AFR Am: >60 mL/min (ref >60)
GFR, Estimated: >60 mL/min (ref >60)
Glucose, Bld: 94 mg/dL (ref 70–99)
Potassium: 4 mmol/L (ref 3.5–5.1)
Sodium: 135 mmol/L (ref 135–145)
Total Bilirubin: 0.3 mg/dL (ref 0.3–1.2)
Total Protein: 7.3 g/dL (ref 6.5–8.1)

## 2018-07-29 LAB — TSH: TSH: 2.037 u[IU]/mL (ref 0.320–4.118)

## 2018-07-29 MED ORDER — SODIUM CHLORIDE 0.9 % IV SOLN
10.0000 mg/kg | Freq: Once | INTRAVENOUS | Status: AC
  Administered 2018-07-29: 440 mg via INTRAVENOUS
  Filled 2018-07-29: qty 8.8

## 2018-07-29 MED ORDER — SODIUM CHLORIDE 0.9 % IV SOLN
Freq: Once | INTRAVENOUS | Status: AC
  Administered 2018-07-29: 09:00:00 via INTRAVENOUS
  Filled 2018-07-29: qty 250

## 2018-07-29 NOTE — Progress Notes (Signed)
Labs reviewed with MD, ok to treat pt with Last week 12/17 CMET resutlts and todays CBC.

## 2018-07-29 NOTE — Patient Instructions (Signed)
[place] Discharge Instructions for Patients Receiving Chemotherapy  Today you received the following chemotherapy agents Durvalumab (IMFINZI).  To help prevent nausea and vomiting after your treatment, we encourage you to take your nausea medication as prescribed.  If you develop nausea and vomiting that is not controlled by your nausea medication, call the clinic.   BELOW ARE SYMPTOMS THAT SHOULD BE REPORTED IMMEDIATELY:  *FEVER GREATER THAN 100.5 F  *CHILLS WITH OR WITHOUT FEVER  NAUSEA AND VOMITING THAT IS NOT CONTROLLED WITH YOUR NAUSEA MEDICATION  *UNUSUAL SHORTNESS OF BREATH  *UNUSUAL BRUISING OR BLEEDING  TENDERNESS IN MOUTH AND THROAT WITH OR WITHOUT PRESENCE OF ULCERS  *URINARY PROBLEMS  *BOWEL PROBLEMS  UNUSUAL RASH Items with * indicate a potential emergency and should be followed up as soon as possible.  Feel free to call the clinic should you have any questions or concerns. The clinic phone number is [phone].  Please show the [facility] at check-in to the Emergency Department and triage nurse.  Durvalumab injection What is this medicine? DURVALUMAB (dur VAL ue mab) is a monoclonal antibody. It is used to treat urothelial cancer and lung cancer. This medicine may be used for other purposes; ask your health care provider or pharmacist if you have questions. COMMON BRAND NAME(S): IMFINZI What should I tell my health care provider before I take this medicine? They need to know if you have any of these conditions: -diabetes -immune system problems -infection -inflammatory bowel disease -kidney disease -liver disease -lung or breathing disease -lupus -organ transplant -stomach or intestine problems -thyroid disease -an unusual or allergic reaction to durvalumab, other medicines, foods, dyes, or preservatives -pregnant or trying to get pregnant -breast-feeding How should I use this medicine? This medicine is for infusion  into a vein. It is given by a health care professional in a hospital or clinic setting. A special MedGuide will be given to you before each treatment. Be sure to read this information carefully each time. Talk to your pediatrician regarding the use of this medicine in children. Special care may be needed. Overdosage: If you think you have taken too much of this medicine contact a poison control center or emergency room at once. NOTE: This medicine is only for you. Do not share this medicine with others. What if I miss a dose? It is important not to miss your dose. Call your doctor or health care professional if you are unable to keep an appointment. What may interact with this medicine? Interactions have not been studied. This list may not describe all possible interactions. Give your health care provider a list of all the medicines, herbs, non-prescription drugs, or dietary supplements you use. Also tell them if you smoke, drink alcohol, or use illegal drugs. Some items may interact with your medicine. What should I watch for while using this medicine? This drug may make you feel generally unwell. Continue your course of treatment even though you feel ill unless your doctor tells you to stop. You may need blood work done while you are taking this medicine. Do not become pregnant while taking this medicine or for 3 months after stopping it. Women should inform their doctor if they wish to become pregnant or think they might be pregnant. There is a potential for serious side effects to an unborn child. Talk to your health care professional or pharmacist for more information. Do not breast-feed an infant while taking this medicine or for 3 months after stopping it. What  side effects may I notice from receiving this medicine? Side effects that you should report to your doctor or health care professional as soon as possible: -allergic reactions like skin rash, itching or hives, swelling of the face,  lips, or tongue -black, tarry stools -bloody or watery diarrhea -breathing problems -change in emotions or moods -change in sex drive -changes in vision -chest pain or chest tightness -chills -confusion -cough -facial flushing -fever -headache -signs and symptoms of high blood sugar such as dizziness; dry mouth; dry skin; fruity breath; nausea; stomach pain; increased hunger or thirst; increased urination -signs and symptoms of liver injury like dark yellow or brown urine; general ill feeling or flu-like symptoms; light-colored stools; loss of appetite; nausea; right upper belly pain; unusually weak or tired; yellowing of the eyes or skin -stomach pain -trouble passing urine or change in the amount of urine -weight gain or weight loss Side effects that usually do not require medical attention (report these to your doctor or health care professional if they continue or are bothersome): -bone pain -constipation -loss of appetite -muscle pain -nausea -swelling of the ankles, feet, hands -tiredness This list may not describe all possible side effects. Call your doctor for medical advice about side effects. You may report side effects to FDA at 1-800-FDA-1088. Where should I keep my medicine? This drug is given in a hospital or clinic and will not be stored at home. NOTE: This sheet is a summary. It may not cover all possible information. If you have questions about this medicine, talk to your doctor, pharmacist, or health care provider.  2019 Elsevier/Gold Standard (2016-09-26 19:25:04)

## 2018-08-01 ENCOUNTER — Telehealth: Payer: Self-pay | Admitting: *Deleted

## 2018-08-01 NOTE — Telephone Encounter (Signed)
TCT pt's son and daughter to follow up with pt after his 1 st treatment of Imfinzi on 07/29/18.  No answer on either phone. Was able to leave VM message for [name] asking how Mr. Gaymon was feeling after Imfinizi.  Attempted call to patient as well. No answer/no VM available.

## 2018-08-01 NOTE — Telephone Encounter (Signed)
-----   Message from Georgianne Fick, RN sent at 07/29/2018 12:05 PM EST ----- Regarding: Dr. Julien Nordmann first time Imfinzi Patient received first time Durvalumab [facility]) today and tolerated this well.

## 2018-08-12 ENCOUNTER — Inpatient Hospital Stay (HOSPITAL_BASED_OUTPATIENT_CLINIC_OR_DEPARTMENT_OTHER): Payer: Self-pay | Admitting: Internal Medicine

## 2018-08-12 ENCOUNTER — Inpatient Hospital Stay: Payer: Self-pay | Attending: Internal Medicine

## 2018-08-12 ENCOUNTER — Encounter: Payer: Self-pay | Admitting: Internal Medicine

## 2018-08-12 ENCOUNTER — Encounter: Payer: Self-pay | Admitting: Pharmacy Technician

## 2018-08-12 ENCOUNTER — Inpatient Hospital Stay: Payer: Self-pay

## 2018-08-12 ENCOUNTER — Telehealth: Payer: Self-pay

## 2018-08-12 ENCOUNTER — Inpatient Hospital Stay: Payer: Self-pay | Admitting: Nutrition

## 2018-08-12 VITALS — BP 92/60 | HR 107 | Temp 98.7°F | Resp 16 | Ht 67.0 in | Wt 102.6 lb

## 2018-08-12 DIAGNOSIS — C342 Malignant neoplasm of middle lobe, bronchus or lung: Secondary | ICD-10-CM

## 2018-08-12 DIAGNOSIS — C771 Secondary and unspecified malignant neoplasm of intrathoracic lymph nodes: Secondary | ICD-10-CM

## 2018-08-12 DIAGNOSIS — C3411 Malignant neoplasm of upper lobe, right bronchus or lung: Secondary | ICD-10-CM | POA: Insufficient documentation

## 2018-08-12 DIAGNOSIS — Z5112 Encounter for antineoplastic immunotherapy: Secondary | ICD-10-CM

## 2018-08-12 DIAGNOSIS — F17210 Nicotine dependence, cigarettes, uncomplicated: Secondary | ICD-10-CM

## 2018-08-12 DIAGNOSIS — Z79899 Other long term (current) drug therapy: Secondary | ICD-10-CM

## 2018-08-12 DIAGNOSIS — C3491 Malignant neoplasm of unspecified part of right bronchus or lung: Secondary | ICD-10-CM

## 2018-08-12 LAB — CBC WITH DIFFERENTIAL (CANCER CENTER ONLY)
Abs Immature Granulocytes: 0.01 10*3/uL (ref 0.00–0.07)
Basophils Absolute: 0.1 10*3/uL (ref 0.0–0.1)
Basophils Relative: 1 %
Eosinophils Absolute: 0.1 10*3/uL (ref 0.0–0.5)
Eosinophils Relative: 2 %
HCT: 34.7 % — ABNORMAL LOW (ref 39.0–52.0)
Hemoglobin: 10.8 g/dL — ABNORMAL LOW (ref 13.0–17.0)
Immature Granulocytes: 0 %
Lymphocytes Relative: 19 %
Lymphs Abs: 1.1 10*3/uL (ref 0.7–4.0)
MCH: 29.5 pg (ref 26.0–34.0)
MCHC: 31.1 g/dL (ref 30.0–36.0)
MCV: 94.8 fL (ref 80.0–100.0)
Monocytes Absolute: 0.8 10*3/uL (ref 0.1–1.0)
Monocytes Relative: 12 %
NEUTROS PCT: 66 %
NRBC: 0 % (ref 0.0–0.2)
Neutro Abs: 4 10*3/uL (ref 1.7–7.7)
Platelet Count: 279 10*3/uL (ref 150–400)
RBC: 3.66 MIL/uL — AB (ref 4.22–5.81)
RDW: 18 % — ABNORMAL HIGH (ref 11.5–15.5)
WBC Count: 6.1 10*3/uL (ref 4.0–10.5)

## 2018-08-12 LAB — CMP (CANCER CENTER ONLY)
ALT: 6 U/L (ref 0–44)
AST: 11 U/L — ABNORMAL LOW (ref 15–41)
Albumin: 3 g/dL — ABNORMAL LOW (ref 3.5–5.0)
Alkaline Phosphatase: 94 U/L (ref 38–126)
Anion gap: 9 (ref 5–15)
BILIRUBIN TOTAL: 0.5 mg/dL (ref 0.3–1.2)
BUN: 9 mg/dL (ref 8–23)
CO2: 26 mmol/L (ref 22–32)
Calcium: 9.3 mg/dL (ref 8.9–10.3)
Chloride: 101 mmol/L (ref 98–111)
Creatinine: 0.68 mg/dL (ref 0.61–1.24)
GFR, Est AFR Am: >60 mL/min (ref >60)
GFR, Estimated: >60 mL/min (ref >60)
Glucose, Bld: 121 mg/dL — ABNORMAL HIGH (ref 70–99)
Potassium: 3.8 mmol/L (ref 3.5–5.1)
Sodium: 136 mmol/L (ref 135–145)
Total Protein: 7.2 g/dL (ref 6.5–8.1)

## 2018-08-12 MED ORDER — SODIUM CHLORIDE 0.9 % IV SOLN
Freq: Once | INTRAVENOUS | Status: AC
  Administered 2018-08-12: 14:00:00 via INTRAVENOUS
  Filled 2018-08-12: qty 250

## 2018-08-12 MED ORDER — SODIUM CHLORIDE 0.9 % IV SOLN
10.8000 mg/kg | Freq: Once | INTRAVENOUS | Status: AC
  Administered 2018-08-12: 480 mg via INTRAVENOUS
  Filled 2018-08-12: qty 9.6

## 2018-08-12 NOTE — Patient Instructions (Signed)
[facility] Discharge Instructions for Patients Receiving Chemotherapy  Today you received the following chemotherapy agents Durvalumab (IMFINZI).  To help prevent nausea and vomiting after your treatment, we encourage you to take your nausea medication as prescribed.  If you develop nausea and vomiting that is not controlled by your nausea medication, call the clinic.   BELOW ARE SYMPTOMS THAT SHOULD BE REPORTED IMMEDIATELY:  *FEVER GREATER THAN 100.5 F  *CHILLS WITH OR WITHOUT FEVER  NAUSEA AND VOMITING THAT IS NOT CONTROLLED WITH YOUR NAUSEA MEDICATION  *UNUSUAL SHORTNESS OF BREATH  *UNUSUAL BRUISING OR BLEEDING  TENDERNESS IN MOUTH AND THROAT WITH OR WITHOUT PRESENCE OF ULCERS  *URINARY PROBLEMS  *BOWEL PROBLEMS  UNUSUAL RASH Items with * indicate a potential emergency and should be followed up as soon as possible.  Feel free to call the clinic should you have any questions or concerns. The clinic phone number is [phone].  Please show the [facility] at check-in to the Emergency Department and triage nurse.  Durvalumab injection What is this medicine? DURVALUMAB (dur VAL ue mab) is a monoclonal antibody. It is used to treat urothelial cancer and lung cancer. This medicine may be used for other purposes; ask your health care provider or pharmacist if you have questions. COMMON BRAND NAME(S): IMFINZI What should I tell my health care provider before I take this medicine? They need to know if you have any of these conditions: -diabetes -immune system problems -infection -inflammatory bowel disease -kidney disease -liver disease -lung or breathing disease -lupus -organ transplant -stomach or intestine problems -thyroid disease -an unusual or allergic reaction to durvalumab, other medicines, foods, dyes, or preservatives -pregnant or trying to get pregnant -breast-feeding How should I use this medicine? This medicine is for infusion  into a vein. It is given by a health care professional in a hospital or clinic setting. A special MedGuide will be given to you before each treatment. Be sure to read this information carefully each time. Talk to your pediatrician regarding the use of this medicine in children. Special care may be needed. Overdosage: If you think you have taken too much of this medicine contact a poison control center or emergency room at once. NOTE: This medicine is only for you. Do not share this medicine with others. What if I miss a dose? It is important not to miss your dose. Call your doctor or health care professional if you are unable to keep an appointment. What may interact with this medicine? Interactions have not been studied. This list may not describe all possible interactions. Give your health care provider a list of all the medicines, herbs, non-prescription drugs, or dietary supplements you use. Also tell them if you smoke, drink alcohol, or use illegal drugs. Some items may interact with your medicine. What should I watch for while using this medicine? This drug may make you feel generally unwell. Continue your course of treatment even though you feel ill unless your doctor tells you to stop. You may need blood work done while you are taking this medicine. Do not become pregnant while taking this medicine or for 3 months after stopping it. Women should inform their doctor if they wish to become pregnant or think they might be pregnant. There is a potential for serious side effects to an unborn child. Talk to your health care professional or pharmacist for more information. Do not breast-feed an infant while taking this medicine or for 3 months after stopping it. What  side effects may I notice from receiving this medicine? Side effects that you should report to your doctor or health care professional as soon as possible: -allergic reactions like skin rash, itching or hives, swelling of the face,  lips, or tongue -black, tarry stools -bloody or watery diarrhea -breathing problems -change in emotions or moods -change in sex drive -changes in vision -chest pain or chest tightness -chills -confusion -cough -facial flushing -fever -headache -signs and symptoms of high blood sugar such as dizziness; dry mouth; dry skin; fruity breath; nausea; stomach pain; increased hunger or thirst; increased urination -signs and symptoms of liver injury like dark yellow or brown urine; general ill feeling or flu-like symptoms; light-colored stools; loss of appetite; nausea; right upper belly pain; unusually weak or tired; yellowing of the eyes or skin -stomach pain -trouble passing urine or change in the amount of urine -weight gain or weight loss Side effects that usually do not require medical attention (report these to your doctor or health care professional if they continue or are bothersome): -bone pain -constipation -loss of appetite -muscle pain -nausea -swelling of the ankles, feet, hands -tiredness This list may not describe all possible side effects. Call your doctor for medical advice about side effects. You may report side effects to FDA at 1-800-FDA-1088. Where should I keep my medicine? This drug is given in a hospital or clinic and will not be stored at home. NOTE: This sheet is a summary. It may not cover all possible information. If you have questions about this medicine, talk to your doctor, pharmacist, or health care provider.  2019 Elsevier/Gold Standard (2016-09-26 19:25:04)

## 2018-08-12 NOTE — Progress Notes (Signed)
Nutrition follow-up was completed with patient during infusion for non-small cell lung cancer. Weight improved and documented as 102.6 pounds January 13, increased from 99.75 pounds November 2018. Patient denies nutrition impact symptoms. He continues to eat meals and graze throughout the day. He states he is now drinking 2 boost a day. He has no questions or concerns.  Nutrition diagnosis: Underweight has improved.  Severe malnutrition continues.  Intervention: Educated patient to continue strategies for increased calories and protein throughout the day. Recommended patient continue boost plus twice a day.  Provided coupons. Teach back method used.  Monitoring, evaluation, goals: Patient will work to increase calories and protein to minimize further weight loss.  Next visit: Monday, February 10 during infusion.  **Disclaimer: This note was dictated with voice recognition software. Similar sounding words can inadvertently be transcribed and this note may contain transcription errors which may not have been corrected upon publication of note.**

## 2018-08-12 NOTE — Telephone Encounter (Signed)
Printed avs and calender of upcoming appointment. Per 1/13 los

## 2018-08-12 NOTE — Progress Notes (Signed)
[place] [phone]   [phone]  OFFICE PROGRESS NOTE  Patient, No Pcp Per No address on file  DIAGNOSIS:Stage IIIB (T4, N2, M0)) non-small cell lung cancer, poorly differentiated carcinoma favoring adenocarcinoma presented with large right hilar and upper lobe lung mass with right hilar and mediastinal lymphadenopathy and compression of right mainstem bronchus as well as right middle lobe bronchus diagnosed in September 2019.  PRIOR THERAPY: Concurrent chemoradiation with weekly carboplatin for AUC of 2 and paclitaxel 45MG/M2. First dose May 14, 2018.Status post6cycles with partial response.  CURRENT THERAPY: Consolidation treatment with immunotherapy with Imfinzi (Durvalumab) 10 mg/KG every 2 weeks.  First dose expected on 07/29/2018.  Status post 1 cycle.  INTERVAL HISTORY: Warren Blankenship 63 y.o. male returns to the clinic today for follow-up visit accompanied by his son.  The patient is feeling fine today with no concerning complaints.  He tolerated the first cycle of his consolidation treatment with Imfinzi fairly well.  He denied having any chest pain, shortness of breath, cough or hemoptysis.  He denied having any significant weight loss or night sweats.  He has no nausea, vomiting, diarrhea or constipation.  He has no skin rash.  Unfortunately he continues to smoke 10 cigarettes every day.  He is here today for evaluation before starting cycle #2.  MEDICAL HISTORY: Past Medical History:  Diagnosis Date  . Mass of right lung   . Tobacco abuse     ALLERGIES:  has No Known Allergies.  MEDICATIONS:  Current Outpatient Medications  Medication Sig Dispense Refill  . acetaminophen (TYLENOL) 325 MG tablet Take 2 tablets (650 mg total) by mouth every 6 (six) hours as needed for mild pain (or Fever >/= 101).    Marland Kitchen albuterol (PROVENTIL) (2.5 MG/3ML) 0.083% nebulizer solution Take 3 mLs (2.5 mg total) by nebulization every 4 (four)  hours as needed for wheezing or shortness of breath. 75 mL 5  . lactose free nutrition (BOOST PLUS) LIQD Take 237 mLs by mouth 3 (three) times daily between meals. 90 Can 0  . Multiple Vitamin (MULTIVITAMIN WITH MINERALS) TABS tablet Take 1 tablet by mouth daily.    . polyethylene glycol (MIRALAX / GLYCOLAX) packet Take 17 g by mouth daily as needed for mild constipation. 14 each 0  . prochlorperazine (COMPAZINE) 10 MG tablet TAKE 1 TABLET BY MOUTH EVERY 6 HOURS AS NEEDED FOR NAUSEA FOR VOMITING 30 tablet 0  . umeclidinium-vilanterol (ANORO ELLIPTA) 62.5-25 MCG/INH AEPB Inhale 1 puff into the lungs daily. (Patient not taking: Reported on 06/24/2018) 60 each 0   No current facility-administered medications for this visit.     SURGICAL HISTORY:  Past Surgical History:  Procedure Laterality Date  . BIOPSY  04/29/2018   Procedure: BIOPSY;  Surgeon: Garner Nash, DO;  Location: [facility];  Service: Cardiopulmonary;;  . BRONCHIAL WASHINGS  04/29/2018   Procedure: BRONCHIAL WASHINGS;  Surgeon: Garner Nash, DO;  Location: [facility];  Service: Cardiopulmonary;;  . ENDOBRONCHIAL ULTRASOUND Bilateral 04/29/2018   Procedure: ENDOBRONCHIAL ULTRASOUND;  Surgeon: Garner Nash, DO;  Location: [facility];  Service: Cardiopulmonary;  Laterality: Bilateral;  . Endobronchial Ultrasound (EBUS), Mediastinum  Bilateral 04/29/2018   Dr. Valeta Harms   . FINE NEEDLE ASPIRATION BIOPSY  04/29/2018   Procedure: FINE NEEDLE ASPIRATION BIOPSY;  Surgeon: Garner Nash, DO;  Location: [facility];  Service: Cardiopulmonary;;  . FLEXIBLE BRONCHOSCOPY  04/29/2018   Procedure: FLEXIBLE BRONCHOSCOPY;  Surgeon: Garner Nash, DO;  Location: [facility];  Service: Cardiopulmonary;;  . TONSILLECTOMY      REVIEW OF SYSTEMS:  A comprehensive review of systems was negative except for: Constitutional: positive for fatigue   PHYSICAL EXAMINATION: General appearance: alert, cooperative and no distress Head:  Normocephalic, without obvious abnormality, atraumatic Neck: no adenopathy, no JVD, supple, symmetrical, trachea midline and thyroid not enlarged, symmetric, no tenderness/mass/nodules Lymph nodes: Cervical, supraclavicular, and axillary nodes normal. Resp: clear to auscultation bilaterally Back: symmetric, no curvature. ROM normal. No CVA tenderness. Cardio: regular rate and rhythm, S1, S2 normal, no murmur, click, rub or gallop GI: soft, non-tender; bowel sounds normal; no masses,  no organomegaly Extremities: extremities normal, atraumatic, no cyanosis or edema  ECOG PERFORMANCE STATUS: 1 - Symptomatic but completely ambulatory  Blood pressure 92/60, pulse (!) 107, temperature 98.7 F (37.1 C), temperature source Oral, resp. rate 16, height _0  (1.702 m), weight 102 lb 9.6 oz (46.5 kg), SpO2 100 %.  LABORATORY DATA: Lab Results  Component Value Date   WBC 4.7 07/29/2018   HGB 10.6 (L) 07/29/2018   HCT 35.3 (L) 07/29/2018   MCV 94.9 07/29/2018   PLT 255 07/29/2018      Chemistry      Component Value Date/Time   NA 135 07/29/2018 0837   K 4.0 07/29/2018 0837   CL 100 07/29/2018 0837   CO2 25 07/29/2018 0837   BUN 9 07/29/2018 0837   CREATININE 0.68 07/29/2018 0837      Component Value Date/Time   CALCIUM 9.5 07/29/2018 0837   ALKPHOS 84 07/29/2018 0837   AST 15 07/29/2018 0837   ALT 10 07/29/2018 0837   BILITOT 0.3 07/29/2018 0837       RADIOGRAPHIC STUDIES: Ct Chest W Contrast  Result Date: 07/17/2018 CLINICAL DATA:  Stage IIIB non-small cell lung cancer EXAM: CT CHEST WITH CONTRAST TECHNIQUE: Multidetector CT imaging of the chest was performed during intravenous contrast administration. CONTRAST:  89m OMNIPAQUE IOHEXOL 300 MG/ML  SOLN COMPARISON:  PET-CT dated 05/10/2018.  CT chest dated 04/12/2018. FINDINGS: Cardiovascular: The heart is normal in size. Small volume anterior pericardial effusion. No evidence of thoracic aortic aneurysm. Mild atherosclerotic  calcifications of the aortic arch. Coronary atherosclerosis of the LAD. Mediastinum/Nodes: Bulky thoracic nodal metastases, improved from prior CT chest, including: --3.0 cm short axis right paratracheal node (series 2/image 87), previously 3.8 cm --2.7 cm short axis low right paratracheal node (series 2/image 87), previously 4.0 cm when measured discretely --2.5 cm short axis right hilar node (series 2/image 67), previously 3.5 cm when measured discretely Visualized thyroid is unremarkable. Lungs/Pleura: Right hilar/suprahilar mass described above leads to narrowing of right upper lobe bronchi with peribronchial thickening and postobstructive nodularity in the medial right upper lobe (series 7/image 77). This appearance is similar to the prior. Mild centrilobular and paraseptal emphysematous changes, upper lobe predominant. No focal consolidation. No pleural effusion or pneumothorax. Upper Abdomen: Visualized upper abdomen is grossly unremarkable. Musculoskeletal: Visualized osseous structures are within normal limits. IMPRESSION: Improving bulky thoracic lymphadenopathy, as above. Mild postobstructive opacity in the suprahilar region/medial right upper lobe, similar. Aortic Atherosclerosis (ICD10-I70.0) and Emphysema (ICD10-J43.9). Electronically Signed   By: SJulian HyM.D.   On: 07/17/2018 08:24    ASSESSMENT AND PLAN: This is a very pleasant 62years old white male recently diagnosed with a stage IIIb non-small cell lung cancer.   The patient completed a course of concurrent chemoradiation with weekly carboplatin and paclitaxel and tolerated his treatment well with partial response. He is  currently undergoing consolidation treatment with immunotherapy with Imfinzi 10 mg/KG every 2 weeks status post 1 cycle.  He tolerated the first cycle of this treatment well. I recommended for the patient to proceed with cycle #2 today as scheduled. I will see him back for follow-up visit in 2 weeks for  evaluation before the next cycle of his treatment. For smoking cessation, I strongly encouraged the patient to quit smoking and offered him a smoke cessation program. He was advised to call immediately if he has any concerning symptoms in the interval. The patient voices understanding of current disease status and treatment options and is in agreement with the current care plan.  All questions were answered. The patient knows to call the clinic with any problems, questions or concerns. We can certainly see the patient much sooner if necessary.  Disclaimer: This note was dictated with voice recognition software. Similar sounding words can inadvertently be transcribed and may not be corrected upon review.

## 2018-08-12 NOTE — Progress Notes (Signed)
The patient is approved for drug assistance by [employer] for Imfinzi.  Enrollment is effective until 08/13/19 and is based on self pay.  Drug replacement will begin for DOS 08/26/18.

## 2018-08-15 ENCOUNTER — Other Ambulatory Visit: Payer: Self-pay

## 2018-08-26 ENCOUNTER — Encounter: Payer: Self-pay | Admitting: Oncology

## 2018-08-26 ENCOUNTER — Inpatient Hospital Stay: Payer: Self-pay

## 2018-08-26 ENCOUNTER — Inpatient Hospital Stay (HOSPITAL_BASED_OUTPATIENT_CLINIC_OR_DEPARTMENT_OTHER): Payer: Self-pay | Admitting: Oncology

## 2018-08-26 VITALS — BP 96/65 | HR 98 | Temp 98.3°F | Resp 18 | Ht 67.0 in | Wt 104.1 lb

## 2018-08-26 DIAGNOSIS — C3491 Malignant neoplasm of unspecified part of right bronchus or lung: Secondary | ICD-10-CM

## 2018-08-26 DIAGNOSIS — F17200 Nicotine dependence, unspecified, uncomplicated: Secondary | ICD-10-CM

## 2018-08-26 DIAGNOSIS — Z5112 Encounter for antineoplastic immunotherapy: Secondary | ICD-10-CM

## 2018-08-26 DIAGNOSIS — F17210 Nicotine dependence, cigarettes, uncomplicated: Secondary | ICD-10-CM

## 2018-08-26 DIAGNOSIS — C3411 Malignant neoplasm of upper lobe, right bronchus or lung: Secondary | ICD-10-CM

## 2018-08-26 DIAGNOSIS — Z79899 Other long term (current) drug therapy: Secondary | ICD-10-CM

## 2018-08-26 DIAGNOSIS — C771 Secondary and unspecified malignant neoplasm of intrathoracic lymph nodes: Secondary | ICD-10-CM

## 2018-08-26 LAB — CBC WITH DIFFERENTIAL (CANCER CENTER ONLY)
Abs Immature Granulocytes: 0.02 10*3/uL (ref 0.00–0.07)
BASOS ABS: 0.1 10*3/uL (ref 0.0–0.1)
Basophils Relative: 1 %
Eosinophils Absolute: 0.1 10*3/uL (ref 0.0–0.5)
Eosinophils Relative: 1 %
HCT: 38 % — ABNORMAL LOW (ref 39.0–52.0)
Hemoglobin: 11.8 g/dL — ABNORMAL LOW (ref 13.0–17.0)
Immature Granulocytes: 0 %
Lymphocytes Relative: 23 %
Lymphs Abs: 1.4 10*3/uL (ref 0.7–4.0)
MCH: 29.4 pg (ref 26.0–34.0)
MCHC: 31.1 g/dL (ref 30.0–36.0)
MCV: 94.8 fL (ref 80.0–100.0)
Monocytes Absolute: 0.8 10*3/uL (ref 0.1–1.0)
Monocytes Relative: 13 %
Neutro Abs: 3.8 10*3/uL (ref 1.7–7.7)
Neutrophils Relative %: 62 %
Platelet Count: 282 10*3/uL (ref 150–400)
RBC: 4.01 MIL/uL — AB (ref 4.22–5.81)
RDW: 15.9 % — ABNORMAL HIGH (ref 11.5–15.5)
WBC: 6.2 10*3/uL (ref 4.0–10.5)
nRBC: 0 % (ref 0.0–0.2)

## 2018-08-26 LAB — CMP (CANCER CENTER ONLY)
ALT: 7 U/L (ref 0–44)
AST: 12 U/L — ABNORMAL LOW (ref 15–41)
Albumin: 3.2 g/dL — ABNORMAL LOW (ref 3.5–5.0)
Alkaline Phosphatase: 84 U/L (ref 38–126)
Anion gap: 11 (ref 5–15)
BUN: 9 mg/dL (ref 8–23)
CO2: 26 mmol/L (ref 22–32)
Calcium: 10 mg/dL (ref 8.9–10.3)
Chloride: 97 mmol/L — ABNORMAL LOW (ref 98–111)
Creatinine: 0.73 mg/dL (ref 0.61–1.24)
GFR, Est AFR Am: >60 mL/min (ref >60)
GFR, Estimated: >60 mL/min (ref >60)
Glucose, Bld: 80 mg/dL (ref 70–99)
Potassium: 4.2 mmol/L (ref 3.5–5.1)
Sodium: 134 mmol/L — ABNORMAL LOW (ref 135–145)
TOTAL PROTEIN: 8 g/dL (ref 6.5–8.1)
Total Bilirubin: 0.4 mg/dL (ref 0.3–1.2)

## 2018-08-26 LAB — TSH: TSH: 1.921 u[IU]/mL (ref 0.320–4.118)

## 2018-08-26 MED ORDER — SODIUM CHLORIDE 0.9 % IV SOLN
11.0000 mg/kg | Freq: Once | INTRAVENOUS | Status: AC
  Administered 2018-08-26: 480 mg via INTRAVENOUS
  Filled 2018-08-26: qty 9.6

## 2018-08-26 MED ORDER — SODIUM CHLORIDE 0.9 % IV SOLN
Freq: Once | INTRAVENOUS | Status: AC
  Administered 2018-08-26: 10:00:00 via INTRAVENOUS
  Filled 2018-08-26: qty 250

## 2018-08-26 NOTE — Progress Notes (Signed)
[place] OFFICE PROGRESS NOTE  Patient, No Pcp Per No address on file  DIAGNOSIS:Stage IIIB (T4, N2, M0)) non-small cell lung cancer, poorly differentiated carcinoma favoring adenocarcinoma presented with large right hilar and upper lobe lung mass with right hilar and mediastinal lymphadenopathy and compression of right mainstem bronchus as well as right middle lobe bronchus diagnosed in September 2019.  PRIOR THERAPY: Concurrent chemoradiation with weekly carboplatin for AUC of 2 and paclitaxel 45MG/M2. First dose May 14, 2018.Status post6cycles with partial response.  CURRENT THERAPY:Consolidation treatment with immunotherapy with Imfinzi (Durvalumab) 10 mg/KG every 2 weeks.First dose expected on 07/29/2018.  Status post 2 cycles.  INTERVAL HISTORY: Warren Blankenship 63 y.o. male returns for a routine follow-up visit accompanied by his son.  The patient is feeling fine today and has no specific complaints.  He continues to tolerate treatment with consolidation Imfinzi fairly well.  He denies fevers and chills.  Denies chest pain, shortness of breath, cough, hemoptysis.  Denies nausea, vomiting, constipation, diarrhea.  Denies recent weight loss or night sweats.  Denies skin rashes.  He reports that he continues to smoke about 10 cigarettes/day.  The patient is here for evaluation prior to cycle #3 of Imfinzi.  MEDICAL HISTORY: Past Medical History:  Diagnosis Date  . Mass of right lung   . Tobacco abuse     ALLERGIES:  has No Known Allergies.  MEDICATIONS:  Current Outpatient Medications  Medication Sig Dispense Refill  . acetaminophen (TYLENOL) 325 MG tablet Take 2 tablets (650 mg total) by mouth every 6 (six) hours as needed for mild pain (or Fever >/= 101).    Marland Kitchen albuterol (PROVENTIL) (2.5 MG/3ML) 0.083% nebulizer solution Take 3 mLs (2.5 mg total) by nebulization every 4 (four) hours as needed for wheezing or shortness of breath. 75 mL 5  . lactose  free nutrition (BOOST PLUS) LIQD Take 237 mLs by mouth 3 (three) times daily between meals. 90 Can 0  . Multiple Vitamin (MULTIVITAMIN WITH MINERALS) TABS tablet Take 1 tablet by mouth daily.    . polyethylene glycol (MIRALAX / GLYCOLAX) packet Take 17 g by mouth daily as needed for mild constipation. 14 each 0  . prochlorperazine (COMPAZINE) 10 MG tablet TAKE 1 TABLET BY MOUTH EVERY 6 HOURS AS NEEDED FOR NAUSEA FOR VOMITING 30 tablet 0  . umeclidinium-vilanterol (ANORO ELLIPTA) 62.5-25 MCG/INH AEPB Inhale 1 puff into the lungs daily. (Patient not taking: Reported on 06/24/2018) 60 each 0   No current facility-administered medications for this visit.     SURGICAL HISTORY:  Past Surgical History:  Procedure Laterality Date  . BIOPSY  04/29/2018   Procedure: BIOPSY;  Surgeon: Garner Nash, DO;  Location: [facility];  Service: Cardiopulmonary;;  . BRONCHIAL WASHINGS  04/29/2018   Procedure: BRONCHIAL WASHINGS;  Surgeon: Garner Nash, DO;  Location: [facility];  Service: Cardiopulmonary;;  . ENDOBRONCHIAL ULTRASOUND Bilateral 04/29/2018   Procedure: ENDOBRONCHIAL ULTRASOUND;  Surgeon: Garner Nash, DO;  Location: [facility];  Service: Cardiopulmonary;  Laterality: Bilateral;  . Endobronchial Ultrasound (EBUS), Mediastinum  Bilateral 04/29/2018   Dr. Valeta Harms   . FINE NEEDLE ASPIRATION BIOPSY  04/29/2018   Procedure: FINE NEEDLE ASPIRATION BIOPSY;  Surgeon: Garner Nash, DO;  Location: [facility];  Service: Cardiopulmonary;;  . FLEXIBLE BRONCHOSCOPY  04/29/2018   Procedure: FLEXIBLE BRONCHOSCOPY;  Surgeon: Garner Nash, DO;  Location: [facility];  Service: Cardiopulmonary;;  . TONSILLECTOMY      REVIEW OF SYSTEMS:   Review of  Systems  Constitutional: Negative for appetite change, chills, fatigue, fever and unexpected weight change.  HENT:   Negative for mouth sores, nosebleeds, sore throat and trouble swallowing.   Eyes: Negative for eye problems and icterus.   Respiratory: Negative for cough, hemoptysis, shortness of breath and wheezing.   Cardiovascular: Negative for chest pain and leg swelling.  Gastrointestinal: Negative for abdominal pain, constipation, diarrhea, nausea and vomiting.  Genitourinary: Negative for bladder incontinence, difficulty urinating, dysuria, frequency and hematuria.   Musculoskeletal: Negative for back pain, gait problem, neck pain and neck stiffness.  Skin: Negative for itching and rash.  Neurological: Negative for dizziness, extremity weakness, gait problem, headaches, light-headedness and seizures.  Hematological: Negative for adenopathy. Does not bruise/bleed easily.  Psychiatric/Behavioral: Negative for confusion, depression and sleep disturbance. The patient is not nervous/anxious.     PHYSICAL EXAMINATION:  Blood pressure 96/65, pulse 98, temperature 98.3 F (36.8 C), temperature source Oral, resp. rate 18, height _0  (1.702 m), weight 104 lb 1.6 oz (47.2 kg), SpO2 100 %.  ECOG PERFORMANCE STATUS: 1 - Symptomatic but completely ambulatory  Physical Exam  Constitutional: Oriented to person, place, and time and well-developed, well-nourished, and in no distress. No distress.  HENT:  Head: Normocephalic and atraumatic.  Mouth/Throat: Oropharynx is clear and moist. No oropharyngeal exudate.  Eyes: Conjunctivae are normal. Right eye exhibits no discharge. Left eye exhibits no discharge. No scleral icterus.  Neck: Normal range of motion. Neck supple.  Cardiovascular: Normal rate, regular rhythm, normal heart sounds and intact distal pulses.   Pulmonary/Chest: Effort normal and breath sounds normal. No respiratory distress. No wheezes. No rales.  Abdominal: Soft. Bowel sounds are normal. Exhibits no distension and no mass. There is no tenderness.  Musculoskeletal: Normal range of motion. Exhibits no edema.  Lymphadenopathy:    No cervical adenopathy.  Neurological: Alert and oriented to person, place, and time.  Exhibits normal muscle tone. Gait normal. Coordination normal.  Skin: Skin is warm and dry. No rash noted. Not diaphoretic. No erythema. No pallor.  Psychiatric: Mood, memory and judgment normal.  Vitals reviewed.  LABORATORY DATA: Lab Results  Component Value Date   WBC 6.2 08/26/2018   HGB 11.8 (L) 08/26/2018   HCT 38.0 (L) 08/26/2018   MCV 94.8 08/26/2018   PLT 282 08/26/2018      Chemistry      Component Value Date/Time   NA 134 (L) 08/26/2018 0850   K 4.2 08/26/2018 0850   CL 97 (L) 08/26/2018 0850   CO2 26 08/26/2018 0850   BUN 9 08/26/2018 0850   CREATININE 0.73 08/26/2018 0850      Component Value Date/Time   CALCIUM 10.0 08/26/2018 0850   ALKPHOS 84 08/26/2018 0850   AST 12 (L) 08/26/2018 0850   ALT 7 08/26/2018 0850   BILITOT 0.4 08/26/2018 0850       RADIOGRAPHIC STUDIES:  No results found.   ASSESSMENT/PLAN:  Stage III squamous cell carcinoma of right lung [facility]) This is a very pleasant 63 year old white male recently diagnosed with a stage IIIb non-small cell lung cancer.   The patient completed a course of concurrent chemoradiation with weekly carboplatin and paclitaxel and tolerated his treatment well with partial response. He is currently undergoing consolidation treatment with immunotherapy with Imfinzi 10 mg/KG every 2 weeks status post 2 cycles.  He is tolerating his treatment fairly well with no concerning complaints.  Recommend for him to proceed with cycle #3 of Imfinzi today as scheduled.  For smoking cessation, I strongly encouraged the patient to quit smoking and offered him a smoke cessation program.  He will follow-up in 2 weeks for evaluation prior to cycle #4.  He was advised to call immediately if he has any concerning symptoms in the interval. The patient voices understanding of current disease status and treatment options and is in agreement with the current care plan.  All questions were answered. The patient knows to call the  clinic with any problems, questions or concerns. We can certainly see the patient much sooner if necessary.   No orders of the defined types were placed in this encounter.    [name], DNP, AGPCNP-BC, AOCNP 08/26/18

## 2018-08-26 NOTE — Patient Instructions (Addendum)
[facility] Discharge Instructions for Patients Receiving Chemotherapy  Today you received the following chemotherapy agents: Imfinzi.  To help prevent nausea and vomiting after your treatment, we encourage you to take your nausea medication as directed.   If you develop nausea and vomiting that is not controlled by your nausea medication, call the clinic.   BELOW ARE SYMPTOMS THAT SHOULD BE REPORTED IMMEDIATELY:  *FEVER GREATER THAN 100.5 F  *CHILLS WITH OR WITHOUT FEVER  NAUSEA AND VOMITING THAT IS NOT CONTROLLED WITH YOUR NAUSEA MEDICATION  *UNUSUAL SHORTNESS OF BREATH  *UNUSUAL BRUISING OR BLEEDING  TENDERNESS IN MOUTH AND THROAT WITH OR WITHOUT PRESENCE OF ULCERS  *URINARY PROBLEMS  *BOWEL PROBLEMS  UNUSUAL RASH Items with * indicate a potential emergency and should be followed up as soon as possible.  Feel free to call the clinic should you have any questions or concerns. The clinic phone number is [phone].  Please show the CHEMO ALERT CARD at check-in to the Emergency Department and triage nurse.   

## 2018-08-26 NOTE — Assessment & Plan Note (Addendum)
This is a very pleasant 63 year old white male recently diagnosed with a stage IIIb non-small cell lung cancer.   The patient completed a course of concurrent chemoradiation with weekly carboplatin and paclitaxel and tolerated his treatment well with partial response. He is currently undergoing consolidation treatment with immunotherapy with Imfinzi 10 mg/KG every 2 weeks status post 2 cycles.  He is tolerating his treatment fairly well with no concerning complaints.  Recommend for him to proceed with cycle #3 of Imfinzi today as scheduled.   For smoking cessation, I strongly encouraged the patient to quit smoking and offered him a smoke cessation program.  He will follow-up in 2 weeks for evaluation prior to cycle #4.  He was advised to call immediately if he has any concerning symptoms in the interval. The patient voices understanding of current disease status and treatment options and is in agreement with the current care plan.  All questions were answered. The patient knows to call the clinic with any problems, questions or concerns. We can certainly see the patient much sooner if necessary.

## 2018-08-27 ENCOUNTER — Telehealth: Payer: Self-pay | Admitting: Oncology

## 2018-08-27 NOTE — Telephone Encounter (Signed)
Scheduled appt per 1/27 los - added another cycle pt to get an updated schedule next visit.

## 2018-08-30 ENCOUNTER — Other Ambulatory Visit: Payer: Self-pay | Admitting: Internal Medicine

## 2018-09-05 ENCOUNTER — Telehealth: Payer: Self-pay | Admitting: Internal Medicine

## 2018-09-05 NOTE — Telephone Encounter (Signed)
Moved 2/10 f/u from [facility] to [facility]. Spoke with patient re change and new start time for appointments.

## 2018-09-06 NOTE — Progress Notes (Deleted)
Synopsis: Hospital follow up, s/p bronchoscopy, Stage IIIa NSCLC.  Subjective:   PATIENT ID: Warren Blankenship GENDER: male DOB: 06-Apr-1956, MRN: [id number]  No chief complaint on file.   63 year old male with a diagnosis of stage IIIb non-small cell lung cancer.  Followed by Dr. Earlie Server in the oncology department.  Patient has completed now 4 cycles of chemotherapy as well as external beam radiation for concomitant chemo XRT.  Patient has been tolerating this very well.  He has gained at least 5 pounds.  He has been trying to maintain his weight and functional status.  He does not have any hemoptysis.  He does have some shortness of breath with significant exertion.  He is able to ambulate with a walker.  He is present today in office with his son.  He has been living with his daughter and son has been helping take care of him.  He does have occasional cough that is nonproductive.  OV    Past Medical History:  Diagnosis Date  . Mass of right lung   . Tobacco abuse      Family History  Problem Relation Age of Onset  . Lung disease Father      Past Surgical History:  Procedure Laterality Date  . BIOPSY  04/29/2018   Procedure: BIOPSY;  Surgeon: Garner Nash, DO;  Location: [facility];  Service: Cardiopulmonary;;  . BRONCHIAL WASHINGS  04/29/2018   Procedure: BRONCHIAL WASHINGS;  Surgeon: Garner Nash, DO;  Location: [facility];  Service: Cardiopulmonary;;  . ENDOBRONCHIAL ULTRASOUND Bilateral 04/29/2018   Procedure: ENDOBRONCHIAL ULTRASOUND;  Surgeon: Garner Nash, DO;  Location: [facility];  Service: Cardiopulmonary;  Laterality: Bilateral;  . Endobronchial Ultrasound (EBUS), Mediastinum  Bilateral 04/29/2018   Dr. Valeta Harms   . FINE NEEDLE ASPIRATION BIOPSY  04/29/2018   Procedure: FINE NEEDLE ASPIRATION BIOPSY;  Surgeon: Garner Nash, DO;  Location: [facility];  Service: Cardiopulmonary;;  . FLEXIBLE BRONCHOSCOPY  04/29/2018   Procedure: FLEXIBLE BRONCHOSCOPY;   Surgeon: Garner Nash, DO;  Location: [facility];  Service: Cardiopulmonary;;  . TONSILLECTOMY      Social History   Socioeconomic History  . Marital status: Divorced    Spouse name: Not on file  . Number of children: Not on file  . Years of education: Not on file  . Highest education level: Not on file  Occupational History  . Not on file  Social Needs  . Financial resource strain: Very hard  . Food insecurity:    Worry: Often true    Inability: Often true  . Transportation needs:    Medical: Yes    Non-medical: Yes  Tobacco Use  . Smoking status: Current Every Day Smoker    Packs/day: 1.00  . Smokeless tobacco: Never Used  . Tobacco comment: 10 cigarettes per day 11.5.19 BB LPN  Substance and Sexual Activity  . Alcohol use: Yes    Comment: Former   . Drug use: Not Currently  . Sexual activity: Not Currently  Lifestyle  . Physical activity:    Days per week: Not on file    Minutes per session: Not on file  . Stress: Not on file  Relationships  . Social connections:    Talks on phone: Not on file    Gets together: Not on file    Attends religious service: Not on file    Active member of club or organization: Not on file    Attends meetings of clubs or organizations: Not  on file    Relationship status: Not on file  . Intimate partner violence:    Fear of current or ex partner: Not on file    Emotionally abused: Not on file    Physically abused: Not on file    Forced sexual activity: Not on file  Other Topics Concern  . Not on file  Social History Narrative   Currently resides in an apartment alone.    Friend states that he has not bathed since last hospitalization.    Friend states that trash, decaying food, animals, bugs, roaches are all living within the home.         No Known Allergies   Outpatient Medications Prior to Visit  Medication Sig Dispense Refill  . acetaminophen (TYLENOL) 325 MG tablet Take 2 tablets (650 mg total) by mouth every 6  (six) hours as needed for mild pain (or Fever >/= 101).    Marland Kitchen albuterol (PROVENTIL) (2.5 MG/3ML) 0.083% nebulizer solution Take 3 mLs (2.5 mg total) by nebulization every 4 (four) hours as needed for wheezing or shortness of breath. 75 mL 5  . lactose free nutrition (BOOST PLUS) LIQD Take 237 mLs by mouth 3 (three) times daily between meals. 90 Can 0  . Multiple Vitamin (MULTIVITAMIN WITH MINERALS) TABS tablet Take 1 tablet by mouth daily.    . polyethylene glycol (MIRALAX / GLYCOLAX) packet Take 17 g by mouth daily as needed for mild constipation. 14 each 0  . prochlorperazine (COMPAZINE) 10 MG tablet TAKE 1 TABLET BY MOUTH EVERY 6 HOURS AS NEEDED FOR NAUSEA OR VOMITING 30 tablet 0  . umeclidinium-vilanterol (ANORO ELLIPTA) 62.5-25 MCG/INH AEPB Inhale 1 puff into the lungs daily. (Patient not taking: Reported on 06/24/2018) 60 each 0   No facility-administered medications prior to visit.     ROS   Objective:  Physical Exam Vitals signs reviewed.  Constitutional:      General: He is not in acute distress.    Appearance: He is well-developed.  HENT:     Head: Normocephalic and atraumatic.     Mouth/Throat:     Pharynx: No oropharyngeal exudate.  Eyes:     Conjunctiva/sclera: Conjunctivae normal.     Pupils: Pupils are equal, round, and reactive to light.  Neck:     Vascular: No JVD.     Trachea: No tracheal deviation.     Comments: Loss of supraclavicular fat Cardiovascular:     Rate and Rhythm: Normal rate and regular rhythm.     Heart sounds: S1 normal and S2 normal.     Comments: Distant heart tones Pulmonary:     Effort: No tachypnea or accessory muscle usage.     Breath sounds: No stridor. Decreased breath sounds (throughout all lung fields) and wheezing present. No rhonchi or rales.  Abdominal:     General: Bowel sounds are normal. There is no distension.     Palpations: Abdomen is soft.     Tenderness: There is no abdominal tenderness.  Musculoskeletal:        General:  Deformity (muscle wasting ) present.  Skin:    General: Skin is warm and dry.     Capillary Refill: Capillary refill takes less than 2 seconds.     Findings: No rash.  Neurological:     Mental Status: He is alert and oriented to person, place, and time.  Psychiatric:        Behavior: Behavior normal.      There were no vitals filed for  this visit.   on RA BMI Readings from Last 3 Encounters:  08/26/18 16.30 kg/m  08/12/18 16.07 kg/m  07/29/18 15.85 kg/m   Wt Readings from Last 3 Encounters:  08/26/18 104 lb 1.6 oz (47.2 kg)  08/12/18 102 lb 9.6 oz (46.5 kg)  07/29/18 101 lb 3.2 oz (45.9 kg)     CBC    Component Value Date/Time   WBC 6.2 08/26/2018 0850   WBC 6.9 05/02/2018 0509   RBC 4.01 (L) 08/26/2018 0850   HGB 11.8 (L) 08/26/2018 0850   HCT 38.0 (L) 08/26/2018 0850   PLT 282 08/26/2018 0850   MCV 94.8 08/26/2018 0850   MCH 29.4 08/26/2018 0850   MCHC 31.1 08/26/2018 0850   RDW 15.9 (H) 08/26/2018 0850   LYMPHSABS 1.4 08/26/2018 0850   MONOABS 0.8 08/26/2018 0850   EOSABS 0.1 08/26/2018 0850   BASOSABS 0.1 08/26/2018 0850    Chest Imaging: CT imaging of the chest reviewed.  Subsequent bronchoscopy. Large mediastinal paratracheal mass.,  Associated lymphadenopathy assistant with primary bronchogenic carcinoma The patient's images have been independently reviewed by me.    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  Currently going undergoing concomitant chemo XRT.  He is tolerating this well.  Recommended maintenance of current functional status.  Maintaining good oral intake.  Watching his weight and  encouraged him to eat as much as possible. He probably has underlying COPD.  No PFTs on file. Will recommend as needed albuterol nebs for shortness of breath and wheezing We will give patient sample of Anoro  Follow-up in 3 months or if symptoms worsen.   Current Outpatient Medications:  .  acetaminophen (TYLENOL) 325 MG tablet, Take 2 tablets (650 mg total) by mouth every 6 (six) hours as needed for mild pain (or Fever >/= 101)., Disp: , Rfl:  .  albuterol (PROVENTIL) (2.5 MG/3ML) 0.083% nebulizer solution, Take 3 mLs (2.5 mg total) by nebulization every 4 (four) hours as needed for wheezing or shortness of breath., Disp: 75 mL, Rfl: 5 .  lactose free nutrition (BOOST PLUS) LIQD, Take 237 mLs by mouth 3 (three) times daily between meals., Disp: 90 Can, Rfl: 0 .  Multiple Vitamin (MULTIVITAMIN WITH MINERALS) TABS tablet, Take 1 tablet by mouth daily., Disp: , Rfl:  .  polyethylene glycol (MIRALAX / GLYCOLAX) packet, Take 17 g by mouth daily as needed for mild constipation., Disp: 14 each, Rfl: 0 .  prochlorperazine (COMPAZINE) 10 MG tablet, TAKE 1 TABLET BY MOUTH EVERY 6 HOURS AS NEEDED FOR NAUSEA OR VOMITING, Disp: 30 tablet, Rfl: 0 .  umeclidinium-vilanterol (ANORO ELLIPTA) 62.5-25 MCG/INH AEPB, Inhale 1 puff into the lungs daily. (Patient not taking: Reported on 06/24/2018), Disp: 60 each, Rfl: 0   Garner Nash, DO [facility] 09/06/2018 11:43 AM

## 2018-09-09 ENCOUNTER — Inpatient Hospital Stay: Payer: Self-pay

## 2018-09-09 ENCOUNTER — Telehealth: Payer: Self-pay

## 2018-09-09 ENCOUNTER — Ambulatory Visit: Payer: Self-pay

## 2018-09-09 ENCOUNTER — Encounter: Payer: Self-pay | Admitting: Nutrition

## 2018-09-09 ENCOUNTER — Other Ambulatory Visit: Payer: Self-pay

## 2018-09-09 ENCOUNTER — Ambulatory Visit: Payer: Self-pay | Admitting: Oncology

## 2018-09-09 ENCOUNTER — Inpatient Hospital Stay: Payer: Self-pay | Attending: Internal Medicine | Admitting: Nutrition

## 2018-09-09 ENCOUNTER — Inpatient Hospital Stay (HOSPITAL_BASED_OUTPATIENT_CLINIC_OR_DEPARTMENT_OTHER): Payer: Self-pay | Admitting: Physician Assistant

## 2018-09-09 ENCOUNTER — Encounter: Payer: Self-pay | Admitting: Physician Assistant

## 2018-09-09 VITALS — BP 100/61 | HR 60 | Temp 97.5°F | Resp 17 | Ht 67.0 in | Wt 102.2 lb

## 2018-09-09 DIAGNOSIS — Z5112 Encounter for antineoplastic immunotherapy: Secondary | ICD-10-CM | POA: Insufficient documentation

## 2018-09-09 DIAGNOSIS — C3411 Malignant neoplasm of upper lobe, right bronchus or lung: Secondary | ICD-10-CM | POA: Insufficient documentation

## 2018-09-09 DIAGNOSIS — F17210 Nicotine dependence, cigarettes, uncomplicated: Secondary | ICD-10-CM | POA: Insufficient documentation

## 2018-09-09 DIAGNOSIS — C3491 Malignant neoplasm of unspecified part of right bronchus or lung: Secondary | ICD-10-CM

## 2018-09-09 DIAGNOSIS — F17200 Nicotine dependence, unspecified, uncomplicated: Secondary | ICD-10-CM

## 2018-09-09 DIAGNOSIS — Z79899 Other long term (current) drug therapy: Secondary | ICD-10-CM | POA: Insufficient documentation

## 2018-09-09 LAB — CMP (CANCER CENTER ONLY)
ALK PHOS: 90 U/L (ref 38–126)
ALT: 8 U/L (ref 0–44)
AST: 12 U/L — ABNORMAL LOW (ref 15–41)
Albumin: 3.1 g/dL — ABNORMAL LOW (ref 3.5–5.0)
Anion gap: 9 (ref 5–15)
BUN: 11 mg/dL (ref 8–23)
CO2: 29 mmol/L (ref 22–32)
Calcium: 10.1 mg/dL (ref 8.9–10.3)
Chloride: 99 mmol/L (ref 98–111)
Creatinine: 0.76 mg/dL (ref 0.61–1.24)
GFR, Estimated: >60 mL/min (ref >60)
Glucose, Bld: 78 mg/dL (ref 70–99)
Potassium: 4.6 mmol/L (ref 3.5–5.1)
Sodium: 137 mmol/L (ref 135–145)
Total Bilirubin: 0.3 mg/dL (ref 0.3–1.2)
Total Protein: 8 g/dL (ref 6.5–8.1)

## 2018-09-09 LAB — CBC WITH DIFFERENTIAL (CANCER CENTER ONLY)
Abs Immature Granulocytes: 0.01 10*3/uL (ref 0.00–0.07)
Basophils Absolute: 0.1 10*3/uL (ref 0.0–0.1)
Basophils Relative: 1 %
EOS ABS: 0 10*3/uL (ref 0.0–0.5)
Eosinophils Relative: 1 %
HCT: 40.3 % (ref 39.0–52.0)
Hemoglobin: 12.4 g/dL — ABNORMAL LOW (ref 13.0–17.0)
Immature Granulocytes: 0 %
Lymphocytes Relative: 24 %
Lymphs Abs: 1.1 10*3/uL (ref 0.7–4.0)
MCH: 29.6 pg (ref 26.0–34.0)
MCHC: 30.8 g/dL (ref 30.0–36.0)
MCV: 96.2 fL (ref 80.0–100.0)
Monocytes Absolute: 0.6 10*3/uL (ref 0.1–1.0)
Monocytes Relative: 13 %
Neutro Abs: 2.9 10*3/uL (ref 1.7–7.7)
Neutrophils Relative %: 61 %
Platelet Count: 278 10*3/uL (ref 150–400)
RBC: 4.19 MIL/uL — ABNORMAL LOW (ref 4.22–5.81)
RDW: 14.8 % (ref 11.5–15.5)
WBC: 4.8 10*3/uL (ref 4.0–10.5)
nRBC: 0 % (ref 0.0–0.2)

## 2018-09-09 MED ORDER — SODIUM CHLORIDE 0.9 % IV SOLN
11.0000 mg/kg | Freq: Once | INTRAVENOUS | Status: AC
  Administered 2018-09-09: 480 mg via INTRAVENOUS
  Filled 2018-09-09: qty 9.6

## 2018-09-09 MED ORDER — SODIUM CHLORIDE 0.9 % IV SOLN
Freq: Once | INTRAVENOUS | Status: AC
  Administered 2018-09-09: 14:00:00 via INTRAVENOUS
  Filled 2018-09-09: qty 250

## 2018-09-09 NOTE — Telephone Encounter (Signed)
Printed avs and calender of upcoming appointment. Per 2/10 los 

## 2018-09-09 NOTE — Progress Notes (Signed)
[facility] OFFICE PROGRESS NOTE  Patient, No Pcp Per No address on file  DIAGNOSIS: Stage IIIB (T4, N2, M0)) non-small cell lung cancer, poorly differentiated carcinoma favoring adenocarcinoma presented with large right hilar and upper lobe lung mass with right hilar and mediastinal lymphadenopathy and compression of right mainstem bronchus as well as right middle lobe bronchus diagnosed in September 2019.  PRIOR THERAPY: Concurrent chemoradiation with weekly carboplatin for AUC of 2 and paclitaxel 45MG/M2. First dose May 14, 2018.Status post6cycleswith partial response.  CURRENT THERAPY: Consolidation treatment with immunotherapy with Imfinzi (Durvalumab) 10 mg/KG every 2 weeks.First dose expected on 07/29/2018.Status post 3 cycles.  INTERVAL HISTORY: Warren Blankenship 63 y.o. male returns for a routine follow-up visit accompanied by his daughter. The patient is feeling fine today and has no specific complaints. He continues to tolerate his Imfinzi with no adverse effects. He reports that he has been having occasional night sweats for approximately 1 month. He denies fever, chills, or fatigue. He denies shortness of breath, chest pain, wheezing, or hemoptysis. He denies nausea, vomiting, constipation, or diarrhea. He denies any rashes or skin changes. He denies any headache or visual changes. He still continues to smoke approximately 8-10 cigarettes a day. He states he is trying to cut back on his tobacco use. He is here today for evaluation prior to starting cycle number 4 of his treatment today.    MEDICAL HISTORY: Past Medical History:  Diagnosis Date  . Mass of right lung   . Tobacco abuse     ALLERGIES:  has No Known Allergies.  MEDICATIONS:  Current Outpatient Medications  Medication Sig Dispense Refill  . acetaminophen (TYLENOL) 325 MG tablet Take 2 tablets (650 mg total) by mouth every 6 (six) hours as needed for mild pain (or Fever >/= 101).    Marland Kitchen  albuterol (PROVENTIL) (2.5 MG/3ML) 0.083% nebulizer solution Take 3 mLs (2.5 mg total) by nebulization every 4 (four) hours as needed for wheezing or shortness of breath. 75 mL 5  . lactose free nutrition (BOOST PLUS) LIQD Take 237 mLs by mouth 3 (three) times daily between meals. 90 Can 0  . Multiple Vitamin (MULTIVITAMIN WITH MINERALS) TABS tablet Take 1 tablet by mouth daily.    . polyethylene glycol (MIRALAX / GLYCOLAX) packet Take 17 g by mouth daily as needed for mild constipation. 14 each 0  . prochlorperazine (COMPAZINE) 10 MG tablet TAKE 1 TABLET BY MOUTH EVERY 6 HOURS AS NEEDED FOR NAUSEA OR VOMITING (Patient not taking: Reported on 09/09/2018) 30 tablet 0  . umeclidinium-vilanterol (ANORO ELLIPTA) 62.5-25 MCG/INH AEPB Inhale 1 puff into the lungs daily. (Patient not taking: Reported on 06/24/2018) 60 each 0   No current facility-administered medications for this visit.     SURGICAL HISTORY:  Past Surgical History:  Procedure Laterality Date  . BIOPSY  04/29/2018   Procedure: BIOPSY;  Surgeon: Garner Nash, DO;  Location: [facility];  Service: Cardiopulmonary;;  . BRONCHIAL WASHINGS  04/29/2018   Procedure: BRONCHIAL WASHINGS;  Surgeon: Garner Nash, DO;  Location: [facility];  Service: Cardiopulmonary;;  . ENDOBRONCHIAL ULTRASOUND Bilateral 04/29/2018   Procedure: ENDOBRONCHIAL ULTRASOUND;  Surgeon: Garner Nash, DO;  Location: [facility];  Service: Cardiopulmonary;  Laterality: Bilateral;  . Endobronchial Ultrasound (EBUS), Mediastinum  Bilateral 04/29/2018   Dr. Valeta Harms   . FINE NEEDLE ASPIRATION BIOPSY  04/29/2018   Procedure: FINE NEEDLE ASPIRATION BIOPSY;  Surgeon: Garner Nash, DO;  Location: [facility];  Service: Cardiopulmonary;;  .  FLEXIBLE BRONCHOSCOPY  04/29/2018   Procedure: FLEXIBLE BRONCHOSCOPY;  Surgeon: Garner Nash, DO;  Location: [facility];  Service: Cardiopulmonary;;  . TONSILLECTOMY      REVIEW OF SYSTEMS:   Review of Systems   Constitutional: Negative for appetite change, chills, fatigue, fever and unexpected weight change.  HENT:   Negative for mouth sores, nosebleeds, sore throat and trouble swallowing.   Eyes: Negative for eye problems and icterus.  Respiratory: Negative for cough, hemoptysis, shortness of breath and wheezing.   Cardiovascular: Negative for chest pain and leg swelling.  Gastrointestinal: Negative for abdominal pain, constipation, diarrhea, nausea and vomiting.  Genitourinary: Negative for bladder incontinence, difficulty urinating, dysuria, frequency and hematuria.   Musculoskeletal: Negative for back pain, gait problem, neck pain and neck stiffness.  Skin: Negative for itching and rash.  Neurological: Negative for dizziness, extremity weakness, gait problem, headaches, light-headedness and seizures.  Hematological: Negative for adenopathy. Does not bruise/bleed easily.  Psychiatric/Behavioral: Negative for confusion, depression and sleep disturbance. The patient is not nervous/anxious.     PHYSICAL EXAMINATION:  Blood pressure 100/61, pulse 60, temperature (!) 97.5 F (36.4 C), temperature source Oral, resp. rate 17, height _0  (1.702 m), weight 102 lb 3.2 oz (46.4 kg), SpO2 93 %.  ECOG PERFORMANCE STATUS: 1 - Symptomatic but completely ambulatory  Physical Exam  Constitutional: Thin appearing caucasian male. Oriented to person, place, and time and  and in no distress. No distress.  HENT:  Head: Normocephalic and atraumatic.  Mouth/Throat: Oropharynx is clear and moist. No oropharyngeal exudate.  Eyes: Conjunctivae are normal. Right eye exhibits no discharge. Left eye exhibits no discharge. No scleral icterus.  Neck: Normal range of motion. Neck supple.  Cardiovascular: Normal rate, regular rhythm, normal heart sounds and intact distal pulses.   Pulmonary/Chest: Diminished breath sounds in right upper lobe. Effort normal and breath sounds normal. No respiratory distress. No wheezes. No  rales.  Abdominal: Soft. Bowel sounds are normal. Exhibits no distension and no mass. There is no tenderness.  Musculoskeletal: Normal range of motion. Exhibits no edema.  Lymphadenopathy:    No cervical adenopathy.  Neurological: Alert and oriented to person, place, and time. Exhibits normal muscle tone. Gait normal. Coordination normal.  Skin: Skin is warm and dry. No rash noted. Not diaphoretic. No erythema. No pallor.  Psychiatric: Mood, memory and judgment normal.  Vitals reviewed.  LABORATORY DATA: Lab Results  Component Value Date   WBC 4.8 09/09/2018   HGB 12.4 (L) 09/09/2018   HCT 40.3 09/09/2018   MCV 96.2 09/09/2018   PLT 278 09/09/2018      Chemistry      Component Value Date/Time   NA 134 (L) 08/26/2018 0850   K 4.2 08/26/2018 0850   CL 97 (L) 08/26/2018 0850   CO2 26 08/26/2018 0850   BUN 9 08/26/2018 0850   CREATININE 0.73 08/26/2018 0850      Component Value Date/Time   CALCIUM 10.0 08/26/2018 0850   ALKPHOS 84 08/26/2018 0850   AST 12 (L) 08/26/2018 0850   ALT 7 08/26/2018 0850   BILITOT 0.4 08/26/2018 0850       RADIOGRAPHIC STUDIES:  No results found.   ASSESSMENT/PLAN:  The patient is a very pleasant 63 year old Caucasian male diagnosed with stage IIIB non-small cell lung cancer.   The patient has completed a course of concurrent chemoradiation with weekly carboplatin and paclitaxel and tolerated it well with a partial response.  He is currently undergoing consolidation treatment with immunotherapy  with 10 mg/kg every 2 weeks of Imfinzi.  He is status post 3 cycles.  The patient was seen with Dr. Julien Nordmann today.  The patient is tolerating Imfinzi well with no adverse effects.  I recommend he proceed with cycle #4 of Imfinzi today as scheduled.   Ragarding his tobacco use, I strongly encouraged the patient to quit smoking and offered a smoking cessation program.  I will plan to see him back for follow-up visit in 2 weeks prior to starting cycle  #5.   The patient is a thin appearing male. He has lost 2 lbs since the previous visit despite maintaining a healthy appetite. He had a meeting with dietary today and will follow their recommendations.   The patient was advised to call immediately if he has any concerning symptoms in the interval. The patient voices understanding of current disease status and treatment options and is in agreement with the current care plan. All questions were answered. The patient knows to call the clinic with any problems, questions or concerns. We can certainly see the patient much sooner if necessary   No orders of the defined types were placed in this encounter.    Aideen Fenster L Edras Wilford, PA-C 09/09/18  ADDENDUM: Hematology/Oncology Attending: I had a face-to-face encounter with the patient today.  I recommended his care plan.  This is a very pleasant 63 years old white male with stage IIIb non-small cell lung cancer status post a course of concurrent chemoradiation with weekly carboplatin and paclitaxel with partial response.  The patient is currently undergoing consolidation treatment with Imfinzi status post 3 cycles.  He has been tolerating this treatment well with no concerning adverse effects. I recommended for the patient to proceed with cycle #4 today as scheduled. For smoking cessation I strongly encouraged the patient to quit smoking and offered him smoking cessation program. For the malnutrition, he is followed by the dietitian at the cancer center. The patient will come back for follow-up visit in 2 weeks for evaluation before the next cycle of his treatment. He was advised to call immediately if he has any concerning symptoms in the interval.  Disclaimer: This note was dictated with voice recognition software. Similar sounding words can inadvertently be transcribed and may be missed upon review. Eilleen Kempf, MD 09/09/18

## 2018-09-09 NOTE — Progress Notes (Signed)
Nutrition follow-up completed with patient receiving treatment for non-small cell lung cancer. Weight was documented as 102.8 pounds today, down from 104.1 pounds January 27.  It is relatively stable overall. He continues to deny nutrition impact symptoms. Denies any difficulty with oral intake. Reports he is drinking 1 boost plus daily. He has no questions or concerns.  Nutrition diagnosis: Underweight continues.  Severe malnutrition continues.  Intervention: Educated patient to increase calories and protein throughout the day. Educated to increase boost plus twice daily. Provided samples and coupons. Teach back method used.  Monitoring, evaluation, goals: Patient will work to increase oral intake to minimize weight loss.  Next visit: March 23 during infusion.  **Disclaimer: This note was dictated with voice recognition software. Similar sounding words can inadvertently be transcribed and this note may contain transcription errors which may not have been corrected upon publication of note.**

## 2018-09-09 NOTE — Patient Instructions (Signed)
[facility] Discharge Instructions for Patients Receiving Chemotherapy  Today you received the following chemotherapy agents: Imfinzi.  To help prevent nausea and vomiting after your treatment, we encourage you to take your nausea medication as directed.   If you develop nausea and vomiting that is not controlled by your nausea medication, call the clinic.   BELOW ARE SYMPTOMS THAT SHOULD BE REPORTED IMMEDIATELY:  *FEVER GREATER THAN 100.5 F  *CHILLS WITH OR WITHOUT FEVER  NAUSEA AND VOMITING THAT IS NOT CONTROLLED WITH YOUR NAUSEA MEDICATION  *UNUSUAL SHORTNESS OF BREATH  *UNUSUAL BRUISING OR BLEEDING  TENDERNESS IN MOUTH AND THROAT WITH OR WITHOUT PRESENCE OF ULCERS  *URINARY PROBLEMS  *BOWEL PROBLEMS  UNUSUAL RASH Items with * indicate a potential emergency and should be followed up as soon as possible.  Feel free to call the clinic should you have any questions or concerns. The clinic phone number is [phone].  Please show the CHEMO ALERT CARD at check-in to the Emergency Department and triage nurse.   

## 2018-09-10 ENCOUNTER — Ambulatory Visit: Payer: Self-pay | Admitting: Pulmonary Disease

## 2018-09-20 ENCOUNTER — Other Ambulatory Visit: Payer: Self-pay | Admitting: *Deleted

## 2018-09-20 DIAGNOSIS — Z5112 Encounter for antineoplastic immunotherapy: Secondary | ICD-10-CM

## 2018-09-23 ENCOUNTER — Inpatient Hospital Stay: Payer: Self-pay

## 2018-09-23 ENCOUNTER — Telehealth: Payer: Self-pay | Admitting: Internal Medicine

## 2018-09-23 ENCOUNTER — Inpatient Hospital Stay (HOSPITAL_BASED_OUTPATIENT_CLINIC_OR_DEPARTMENT_OTHER): Payer: Self-pay | Admitting: Internal Medicine

## 2018-09-23 ENCOUNTER — Encounter: Payer: Self-pay | Admitting: Internal Medicine

## 2018-09-23 VITALS — BP 93/63 | HR 92 | Temp 98.0°F | Resp 18 | Ht 67.0 in | Wt 100.7 lb

## 2018-09-23 DIAGNOSIS — F17210 Nicotine dependence, cigarettes, uncomplicated: Secondary | ICD-10-CM

## 2018-09-23 DIAGNOSIS — C342 Malignant neoplasm of middle lobe, bronchus or lung: Secondary | ICD-10-CM

## 2018-09-23 DIAGNOSIS — Z5112 Encounter for antineoplastic immunotherapy: Secondary | ICD-10-CM

## 2018-09-23 DIAGNOSIS — F17200 Nicotine dependence, unspecified, uncomplicated: Secondary | ICD-10-CM

## 2018-09-23 DIAGNOSIS — Z79899 Other long term (current) drug therapy: Secondary | ICD-10-CM

## 2018-09-23 DIAGNOSIS — C3491 Malignant neoplasm of unspecified part of right bronchus or lung: Secondary | ICD-10-CM

## 2018-09-23 DIAGNOSIS — C3411 Malignant neoplasm of upper lobe, right bronchus or lung: Secondary | ICD-10-CM

## 2018-09-23 LAB — CBC WITH DIFFERENTIAL (CANCER CENTER ONLY)
Abs Immature Granulocytes: 0.01 10*3/uL (ref 0.00–0.07)
Basophils Absolute: 0 10*3/uL (ref 0.0–0.1)
Basophils Relative: 1 %
Eosinophils Absolute: 0.1 10*3/uL (ref 0.0–0.5)
Eosinophils Relative: 2 %
HEMATOCRIT: 35.8 % — AB (ref 39.0–52.0)
Hemoglobin: 11.3 g/dL — ABNORMAL LOW (ref 13.0–17.0)
Immature Granulocytes: 0 %
Lymphocytes Relative: 20 %
Lymphs Abs: 1 10*3/uL (ref 0.7–4.0)
MCH: 29.1 pg (ref 26.0–34.0)
MCHC: 31.6 g/dL (ref 30.0–36.0)
MCV: 92.3 fL (ref 80.0–100.0)
MONO ABS: 0.5 10*3/uL (ref 0.1–1.0)
Monocytes Relative: 11 %
Neutro Abs: 3.3 10*3/uL (ref 1.7–7.7)
Neutrophils Relative %: 66 %
Platelet Count: 303 10*3/uL (ref 150–400)
RBC: 3.88 MIL/uL — ABNORMAL LOW (ref 4.22–5.81)
RDW: 14.3 % (ref 11.5–15.5)
WBC Count: 5 10*3/uL (ref 4.0–10.5)
nRBC: 0 % (ref 0.0–0.2)

## 2018-09-23 LAB — CMP (CANCER CENTER ONLY)
ALT: 10 U/L (ref 0–44)
AST: 13 U/L — ABNORMAL LOW (ref 15–41)
Albumin: 2.8 g/dL — ABNORMAL LOW (ref 3.5–5.0)
Alkaline Phosphatase: 88 U/L (ref 38–126)
Anion gap: 14 (ref 5–15)
BUN: 8 mg/dL (ref 8–23)
CO2: 22 mmol/L (ref 22–32)
Calcium: 9.6 mg/dL (ref 8.9–10.3)
Chloride: 101 mmol/L (ref 98–111)
Creatinine: 0.77 mg/dL (ref 0.61–1.24)
GFR, Est AFR Am: >60 mL/min (ref >60)
GFR, Estimated: >60 mL/min (ref >60)
Glucose, Bld: 141 mg/dL — ABNORMAL HIGH (ref 70–99)
Potassium: 3.9 mmol/L (ref 3.5–5.1)
Sodium: 137 mmol/L (ref 135–145)
Total Bilirubin: 0.3 mg/dL (ref 0.3–1.2)
Total Protein: 7.3 g/dL (ref 6.5–8.1)

## 2018-09-23 LAB — TSH: TSH: 1.242 u[IU]/mL (ref 0.320–4.118)

## 2018-09-23 MED ORDER — SODIUM CHLORIDE 0.9 % IV SOLN
Freq: Once | INTRAVENOUS | Status: AC
  Administered 2018-09-23: 13:00:00 via INTRAVENOUS
  Filled 2018-09-23: qty 250

## 2018-09-23 MED ORDER — SODIUM CHLORIDE 0.9 % IV SOLN
11.3000 mg/kg | Freq: Once | INTRAVENOUS | Status: AC
  Administered 2018-09-23: 500 mg via INTRAVENOUS
  Filled 2018-09-23: qty 10

## 2018-09-23 NOTE — Patient Instructions (Signed)
[facility] Discharge Instructions for Patients Receiving Chemotherapy  Today you received the following chemotherapy agents: Imfinzi.  To help prevent nausea and vomiting after your treatment, we encourage you to take your nausea medication as directed.   If you develop nausea and vomiting that is not controlled by your nausea medication, call the clinic.   BELOW ARE SYMPTOMS THAT SHOULD BE REPORTED IMMEDIATELY:  *FEVER GREATER THAN 100.5 F  *CHILLS WITH OR WITHOUT FEVER  NAUSEA AND VOMITING THAT IS NOT CONTROLLED WITH YOUR NAUSEA MEDICATION  *UNUSUAL SHORTNESS OF BREATH  *UNUSUAL BRUISING OR BLEEDING  TENDERNESS IN MOUTH AND THROAT WITH OR WITHOUT PRESENCE OF ULCERS  *URINARY PROBLEMS  *BOWEL PROBLEMS  UNUSUAL RASH Items with * indicate a potential emergency and should be followed up as soon as possible.  Feel free to call the clinic should you have any questions or concerns. The clinic phone number is [phone].  Please show the CHEMO ALERT CARD at check-in to the Emergency Department and triage nurse.   

## 2018-09-23 NOTE — Progress Notes (Signed)
[place] [phone]   [phone]  OFFICE PROGRESS NOTE  Patient, No Pcp Per No address on file  DIAGNOSIS:Stage IIIB (T4, N2, M0)) non-small cell lung cancer, poorly differentiated carcinoma favoring adenocarcinoma presented with large right hilar and upper lobe lung mass with right hilar and mediastinal lymphadenopathy and compression of right mainstem bronchus as well as right middle lobe bronchus diagnosed in September 2019.  PRIOR THERAPY: Concurrent chemoradiation with weekly carboplatin for AUC of 2 and paclitaxel 45MG/M2. First dose May 14, 2018.Status post6cycles with partial response.  CURRENT THERAPY: Consolidation treatment with immunotherapy with Imfinzi (Durvalumab) 10 mg/KG every 2 weeks.  First dose expected on 07/29/2018.  Status post 4 cycles.  INTERVAL HISTORY: Warren Blankenship 63 y.o. male returns to the clinic today for follow-up visit accompanied by his daughter.  The patient is feeling fine today with no concerning complaints except for fatigue.  He denied having any chest pain, shortness of breath except with exertion with no cough or hemoptysis.  He denied having any fever or chills.  He has no nausea, vomiting, diarrhea or constipation.  Unfortunately he continues to smoke 1 pack of cigarettes every day.  He has been tolerating his treatment with Imfinzi fairly well.  He is here for evaluation before starting cycle #5.   MEDICAL HISTORY: Past Medical History:  Diagnosis Date  . Mass of right lung   . Tobacco abuse     ALLERGIES:  has No Known Allergies.  MEDICATIONS:  Current Outpatient Medications  Medication Sig Dispense Refill  . acetaminophen (TYLENOL) 325 MG tablet Take 2 tablets (650 mg total) by mouth every 6 (six) hours as needed for mild pain (or Fever >/= 101).    Marland Kitchen albuterol (PROVENTIL) (2.5 MG/3ML) 0.083% nebulizer solution Take 3 mLs (2.5 mg total) by nebulization every 4 (four) hours as  needed for wheezing or shortness of breath. 75 mL 5  . lactose free nutrition (BOOST PLUS) LIQD Take 237 mLs by mouth 3 (three) times daily between meals. 90 Can 0  . Multiple Vitamin (MULTIVITAMIN WITH MINERALS) TABS tablet Take 1 tablet by mouth daily.    . prochlorperazine (COMPAZINE) 10 MG tablet TAKE 1 TABLET BY MOUTH EVERY 6 HOURS AS NEEDED FOR NAUSEA OR VOMITING 30 tablet 0  . polyethylene glycol (MIRALAX / GLYCOLAX) packet Take 17 g by mouth daily as needed for mild constipation. (Patient not taking: Reported on 09/23/2018) 14 each 0  . umeclidinium-vilanterol (ANORO ELLIPTA) 62.5-25 MCG/INH AEPB Inhale 1 puff into the lungs daily. (Patient not taking: Reported on 06/24/2018) 60 each 0   No current facility-administered medications for this visit.     SURGICAL HISTORY:  Past Surgical History:  Procedure Laterality Date  . BIOPSY  04/29/2018   Procedure: BIOPSY;  Surgeon: Garner Nash, DO;  Location: [facility];  Service: Cardiopulmonary;;  . BRONCHIAL WASHINGS  04/29/2018   Procedure: BRONCHIAL WASHINGS;  Surgeon: Garner Nash, DO;  Location: [facility];  Service: Cardiopulmonary;;  . ENDOBRONCHIAL ULTRASOUND Bilateral 04/29/2018   Procedure: ENDOBRONCHIAL ULTRASOUND;  Surgeon: Garner Nash, DO;  Location: [facility];  Service: Cardiopulmonary;  Laterality: Bilateral;  . Endobronchial Ultrasound (EBUS), Mediastinum  Bilateral 04/29/2018   Dr. Valeta Harms   . FINE NEEDLE ASPIRATION BIOPSY  04/29/2018   Procedure: FINE NEEDLE ASPIRATION BIOPSY;  Surgeon: Garner Nash, DO;  Location: [facility];  Service: Cardiopulmonary;;  . FLEXIBLE BRONCHOSCOPY  04/29/2018   Procedure: FLEXIBLE BRONCHOSCOPY;  Surgeon: Valeta Harms,  Octavio Graves, DO;  Location: [facility];  Service: Cardiopulmonary;;  . TONSILLECTOMY      REVIEW OF SYSTEMS:  A comprehensive review of systems was negative except for: Constitutional: positive for fatigue   PHYSICAL EXAMINATION: General appearance: alert,  cooperative and no distress Head: Normocephalic, without obvious abnormality, atraumatic Neck: no adenopathy, no JVD, supple, symmetrical, trachea midline and thyroid not enlarged, symmetric, no tenderness/mass/nodules Lymph nodes: Cervical, supraclavicular, and axillary nodes normal. Resp: clear to auscultation bilaterally Back: symmetric, no curvature. ROM normal. No CVA tenderness. Cardio: regular rate and rhythm, S1, S2 normal, no murmur, click, rub or gallop GI: soft, non-tender; bowel sounds normal; no masses,  no organomegaly Extremities: extremities normal, atraumatic, no cyanosis or edema  ECOG PERFORMANCE STATUS: 1 - Symptomatic but completely ambulatory  Blood pressure 93/63, pulse 92, temperature 98 F (36.7 C), temperature source Oral, resp. rate 18, height _0  (1.702 m), weight 100 lb 11.2 oz (45.7 kg), SpO2 100 %.  LABORATORY DATA: Lab Results  Component Value Date   WBC 5.0 09/23/2018   HGB 11.3 (L) 09/23/2018   HCT 35.8 (L) 09/23/2018   MCV 92.3 09/23/2018   PLT 303 09/23/2018      Chemistry      Component Value Date/Time   NA 137 09/23/2018 1127   K 3.9 09/23/2018 1127   CL 101 09/23/2018 1127   CO2 22 09/23/2018 1127   BUN 8 09/23/2018 1127   CREATININE 0.77 09/23/2018 1127      Component Value Date/Time   CALCIUM 9.6 09/23/2018 1127   ALKPHOS 88 09/23/2018 1127   AST 13 (L) 09/23/2018 1127   ALT 10 09/23/2018 1127   BILITOT 0.3 09/23/2018 1127       RADIOGRAPHIC STUDIES: No results found.  ASSESSMENT AND PLAN: This is a very pleasant 63 years old white male recently diagnosed with a stage IIIb non-small cell lung cancer.   The patient completed a course of concurrent chemoradiation with weekly carboplatin and paclitaxel and tolerated his treatment well with partial response. He is currently undergoing consolidation treatment with immunotherapy with Imfinzi 10 mg/KG every 2 weeks status post 4 cycles.  The patient continues to tolerate this  treatment well with no concerning complaints. I recommended for the patient to proceed with cycle #5 today as scheduled. I will see him back for follow-up visit in 2 weeks for evaluation before the next cycle of his treatment. The patient was advised to call immediately if he has any concerning symptoms in the interval. The patient voices understanding of current disease status and treatment options and is in agreement with the current care plan. All questions were answered. The patient knows to call the clinic with any problems, questions or concerns. We can certainly see the patient much sooner if necessary.  Disclaimer: This note was dictated with voice recognition software. Similar sounding words can inadvertently be transcribed and may not be corrected upon review.

## 2018-09-23 NOTE — Patient Instructions (Signed)
Steps to Quit Smoking    Smoking tobacco can be bad for your health. It can also affect almost every organ in your body. Smoking puts you and people around you at risk for many serious long-lasting (chronic) diseases. Quitting smoking is hard, but it is one of the best things that you can do for your health. It is never too late to quit.  What are the benefits of quitting smoking?  When you quit smoking, you lower your risk for getting serious diseases and conditions. They can include:  · Lung cancer or lung disease.  · Heart disease.  · Stroke.  · Heart attack.  · Not being able to have children (infertility).  · Weak bones (osteoporosis) and broken bones (fractures).  If you have coughing, wheezing, and shortness of breath, those symptoms may get better when you quit. You may also get sick less often. If you are pregnant, quitting smoking can help to lower your chances of having a baby of low birth weight.  What can I do to help me quit smoking?  Talk with your doctor about what can help you quit smoking. Some things you can do (strategies) include:  · Quitting smoking totally, instead of slowly cutting back how much you smoke over a period of time.  · Going to in-person counseling. You are more likely to quit if you go to many counseling sessions.  · Using resources and support systems, such as:  ? Online chats with a counselor.  ? Phone quitlines.  ? Printed self-help materials.  ? Support groups or group counseling.  ? Text messaging programs.  ? Mobile phone apps or applications.  · Taking medicines. Some of these medicines may have nicotine in them. If you are pregnant or breastfeeding, do not take any medicines to quit smoking unless your doctor says it is okay. Talk with your doctor about counseling or other things that can help you.  Talk with your doctor about using more than one strategy at the same time, such as taking medicines while you are also going to in-person counseling. This can help make  quitting easier.  What things can I do to make it easier to quit?  Quitting smoking might feel very hard at first, but there is a lot that you can do to make it easier. Take these steps:  · Talk to your family and friends. Ask them to support and encourage you.  · Call phone quitlines, reach out to support groups, or work with a counselor.  · Ask people who smoke to not smoke around you.  · Avoid places that make you want (trigger) to smoke, such as:  ? Bars.  ? Parties.  ? Smoke-break areas at work.  · Spend time with people who do not smoke.  · Lower the stress in your life. Stress can make you want to smoke. Try these things to help your stress:  ? Getting regular exercise.  ? Deep-breathing exercises.  ? Yoga.  ? Meditating.  ? Doing a body scan. To do this, close your eyes, focus on one area of your body at a time from head to toe, and notice which parts of your body are tense. Try to relax the muscles in those areas.  · Download or buy apps on your mobile phone or tablet that can help you stick to your quit plan. There are many free apps, such as QuitGuide from the CDC (Centers for Disease Control and Prevention). You can find more   support from smokefree.gov and other websites.  This information is not intended to replace advice given to you by your health care provider. Make sure you discuss any questions you have with your health care provider.  Document Released: 05/13/2009 Document Revised: 03/14/2016 Document Reviewed: 12/01/2014  Elsevier Interactive Patient Education © 2019 [employer]

## 2018-09-23 NOTE — Telephone Encounter (Signed)
Added additional cycles per 2/24 los - pt to get an updated schedule next visit.

## 2018-09-25 ENCOUNTER — Telehealth: Payer: Self-pay | Admitting: Internal Medicine

## 2018-09-25 NOTE — Telephone Encounter (Signed)
Called per scheduling VM log.  Patient wanted to change the appt from a Tuesday to a Monday.  Patient is aware of appt date and time.

## 2018-10-03 ENCOUNTER — Other Ambulatory Visit: Payer: Self-pay | Admitting: Medical Oncology

## 2018-10-03 DIAGNOSIS — C3491 Malignant neoplasm of unspecified part of right bronchus or lung: Secondary | ICD-10-CM

## 2018-10-07 ENCOUNTER — Telehealth: Payer: Self-pay | Admitting: Internal Medicine

## 2018-10-07 ENCOUNTER — Other Ambulatory Visit: Payer: Self-pay | Admitting: Medical

## 2018-10-07 ENCOUNTER — Inpatient Hospital Stay: Payer: Medicaid Other | Admitting: Medical

## 2018-10-07 ENCOUNTER — Inpatient Hospital Stay (HOSPITAL_BASED_OUTPATIENT_CLINIC_OR_DEPARTMENT_OTHER): Payer: Medicaid Other | Admitting: Internal Medicine

## 2018-10-07 ENCOUNTER — Inpatient Hospital Stay: Payer: Medicaid Other | Attending: Internal Medicine

## 2018-10-07 ENCOUNTER — Inpatient Hospital Stay: Payer: Medicaid Other

## 2018-10-07 ENCOUNTER — Encounter: Payer: Self-pay | Admitting: Internal Medicine

## 2018-10-07 VITALS — BP 102/69 | HR 97 | Temp 98.7°F | Resp 18 | Ht 67.0 in | Wt 100.4 lb

## 2018-10-07 DIAGNOSIS — C771 Secondary and unspecified malignant neoplasm of intrathoracic lymph nodes: Secondary | ICD-10-CM | POA: Diagnosis not present

## 2018-10-07 DIAGNOSIS — C342 Malignant neoplasm of middle lobe, bronchus or lung: Secondary | ICD-10-CM

## 2018-10-07 DIAGNOSIS — Z720 Tobacco use: Secondary | ICD-10-CM | POA: Insufficient documentation

## 2018-10-07 DIAGNOSIS — L609 Nail disorder, unspecified: Secondary | ICD-10-CM

## 2018-10-07 DIAGNOSIS — C3490 Malignant neoplasm of unspecified part of unspecified bronchus or lung: Secondary | ICD-10-CM

## 2018-10-07 DIAGNOSIS — Z79899 Other long term (current) drug therapy: Secondary | ICD-10-CM

## 2018-10-07 DIAGNOSIS — C3411 Malignant neoplasm of upper lobe, right bronchus or lung: Secondary | ICD-10-CM | POA: Insufficient documentation

## 2018-10-07 DIAGNOSIS — F17210 Nicotine dependence, cigarettes, uncomplicated: Secondary | ICD-10-CM

## 2018-10-07 DIAGNOSIS — Z5112 Encounter for antineoplastic immunotherapy: Secondary | ICD-10-CM | POA: Insufficient documentation

## 2018-10-07 DIAGNOSIS — C3491 Malignant neoplasm of unspecified part of right bronchus or lung: Secondary | ICD-10-CM

## 2018-10-07 DIAGNOSIS — F17200 Nicotine dependence, unspecified, uncomplicated: Secondary | ICD-10-CM

## 2018-10-07 LAB — CBC WITH DIFFERENTIAL (CANCER CENTER ONLY)
Abs Immature Granulocytes: 0.01 10*3/uL (ref 0.00–0.07)
Basophils Absolute: 0.1 10*3/uL (ref 0.0–0.1)
Basophils Relative: 1 %
EOS PCT: 2 %
Eosinophils Absolute: 0.1 10*3/uL (ref 0.0–0.5)
HCT: 35 % — ABNORMAL LOW (ref 39.0–52.0)
Hemoglobin: 11.1 g/dL — ABNORMAL LOW (ref 13.0–17.0)
Immature Granulocytes: 0 %
Lymphocytes Relative: 17 %
Lymphs Abs: 1 10*3/uL (ref 0.7–4.0)
MCH: 28.8 pg (ref 26.0–34.0)
MCHC: 31.7 g/dL (ref 30.0–36.0)
MCV: 90.9 fL (ref 80.0–100.0)
MONO ABS: 0.6 10*3/uL (ref 0.1–1.0)
MONOS PCT: 11 %
Neutro Abs: 4.1 10*3/uL (ref 1.7–7.7)
Neutrophils Relative %: 69 %
Platelet Count: 334 10*3/uL (ref 150–400)
RBC: 3.85 MIL/uL — ABNORMAL LOW (ref 4.22–5.81)
RDW: 14.3 % (ref 11.5–15.5)
WBC Count: 5.9 10*3/uL (ref 4.0–10.5)
nRBC: 0 % (ref 0.0–0.2)

## 2018-10-07 LAB — COMPREHENSIVE METABOLIC PANEL
ALT: 15 U/L (ref 0–44)
AST: 20 U/L (ref 15–41)
Albumin: 2.9 g/dL — ABNORMAL LOW (ref 3.5–5.0)
Alkaline Phosphatase: 85 U/L (ref 38–126)
Anion gap: 9 (ref 5–15)
BUN: 11 mg/dL (ref 8–23)
CALCIUM: 9.3 mg/dL (ref 8.9–10.3)
CO2: 26 mmol/L (ref 22–32)
Chloride: 99 mmol/L (ref 98–111)
Creatinine, Ser: 0.64 mg/dL (ref 0.61–1.24)
GFR calc non Af Amer: >60 mL/min (ref >60)
Glucose, Bld: 82 mg/dL (ref 70–99)
Potassium: 3.8 mmol/L (ref 3.5–5.1)
Sodium: 134 mmol/L — ABNORMAL LOW (ref 135–145)
Total Bilirubin: 0.5 mg/dL (ref 0.3–1.2)
Total Protein: 7.5 g/dL (ref 6.5–8.1)

## 2018-10-07 MED ORDER — CHLORHEXIDINE GLUCONATE 4 % EX LIQD: Freq: Every day | CUTANEOUS | 1 refills | Status: AC | PRN

## 2018-10-07 MED ORDER — SODIUM CHLORIDE 0.9 % IV SOLN
Freq: Once | INTRAVENOUS | Status: AC
  Administered 2018-10-07: 13:00:00 via INTRAVENOUS
  Filled 2018-10-07: qty 250

## 2018-10-07 MED ORDER — MUPIROCIN 2 % EX OINT: TOPICAL_OINTMENT | CUTANEOUS | 1 refills | Status: AC

## 2018-10-07 MED ORDER — SODIUM CHLORIDE 0.9 % IV SOLN
480.0000 mg | Freq: Once | INTRAVENOUS | Status: AC
  Administered 2018-10-07: 480 mg via INTRAVENOUS
  Filled 2018-10-07: qty 9.6

## 2018-10-07 NOTE — Progress Notes (Signed)
Warren Blankenship

## 2018-10-07 NOTE — Telephone Encounter (Signed)
Appt already scheduled. Gave patient number to central radiology. Printed calendar and avs.

## 2018-10-07 NOTE — Progress Notes (Signed)
[place] Telephone:([phone]   Fax:(336) [phone]  OFFICE PROGRESS NOTE  Patient, No Pcp Per No address on file  DIAGNOSIS:Stage IIIB (T4, N2, M0)) non-small cell lung cancer, poorly differentiated carcinoma favoring adenocarcinoma presented with large right hilar and upper lobe lung mass with right hilar and mediastinal lymphadenopathy and compression of right mainstem bronchus as well as right middle lobe bronchus diagnosed in September 2019.  PRIOR THERAPY: Concurrent chemoradiation with weekly carboplatin for AUC of 2 and paclitaxel 45MG/M2. First dose May 14, 2018.Status post6cycles with partial response.  CURRENT THERAPY: Consolidation treatment with immunotherapy with Imfinzi (Durvalumab) 10 mg/KG every 2 weeks.  First dose expected on 07/29/2018.  Status post 5 cycles.  INTERVAL HISTORY: Warren Blankenship 63 y.o. male returns to the clinic today for follow-up visit accompanied by his son.  The patient is feeling fine today with no concerning complaints except for the baseline shortness of breath.  Unfortunately he continues to smoke at regular basis.  He denied having any chest pain but has mild cough with no hemoptysis.  He denied having any weight loss or night sweats.  He has no headache or visual changes.  He has no nausea, vomiting, diarrhea or constipation.  He is here today for evaluation before starting cycle #6.   MEDICAL HISTORY: Past Medical History:  Diagnosis Date  . Mass of right lung   . Tobacco abuse     ALLERGIES:  has No Known Allergies.  MEDICATIONS:  Current Outpatient Medications  Medication Sig Dispense Refill  . acetaminophen (TYLENOL) 325 MG tablet Take 2 tablets (650 mg total) by mouth every 6 (six) hours as needed for mild pain (or Fever >/= 101).    Marland Kitchen albuterol (PROVENTIL) (2.5 MG/3ML) 0.083% nebulizer solution Take 3 mLs (2.5 mg total) by nebulization every 4 (four) hours as needed for wheezing or shortness of  breath. 75 mL 5  . lactose free nutrition (BOOST PLUS) LIQD Take 237 mLs by mouth 3 (three) times daily between meals. 90 Can 0  . Multiple Vitamin (MULTIVITAMIN WITH MINERALS) TABS tablet Take 1 tablet by mouth daily.    . polyethylene glycol (MIRALAX / GLYCOLAX) packet Take 17 g by mouth daily as needed for mild constipation. (Patient not taking: Reported on 09/23/2018) 14 each 0  . prochlorperazine (COMPAZINE) 10 MG tablet TAKE 1 TABLET BY MOUTH EVERY 6 HOURS AS NEEDED FOR NAUSEA OR VOMITING 30 tablet 0  . umeclidinium-vilanterol (ANORO ELLIPTA) 62.5-25 MCG/INH AEPB Inhale 1 puff into the lungs daily. (Patient not taking: Reported on 06/24/2018) 60 each 0   No current facility-administered medications for this visit.     SURGICAL HISTORY:  Past Surgical History:  Procedure Laterality Date  . BIOPSY  04/29/2018   Procedure: BIOPSY;  Surgeon: Garner Nash, DO;  Location: [facility];  Service: Cardiopulmonary;;  . BRONCHIAL WASHINGS  04/29/2018   Procedure: BRONCHIAL WASHINGS;  Surgeon: Garner Nash, DO;  Location: [facility];  Service: Cardiopulmonary;;  . ENDOBRONCHIAL ULTRASOUND Bilateral 04/29/2018   Procedure: ENDOBRONCHIAL ULTRASOUND;  Surgeon: Garner Nash, DO;  Location: [facility];  Service: Cardiopulmonary;  Laterality: Bilateral;  . Endobronchial Ultrasound (EBUS), Mediastinum  Bilateral 04/29/2018   Dr. Valeta Harms   . FINE NEEDLE ASPIRATION BIOPSY  04/29/2018   Procedure: FINE NEEDLE ASPIRATION BIOPSY;  Surgeon: Garner Nash, DO;  Location: [facility];  Service: Cardiopulmonary;;  . FLEXIBLE BRONCHOSCOPY  04/29/2018   Procedure: FLEXIBLE BRONCHOSCOPY;  Surgeon: Garner Nash, DO;  Location: [facility];  Service: Cardiopulmonary;;  . TONSILLECTOMY      REVIEW OF SYSTEMS:  A comprehensive review of systems was negative except for: Constitutional: positive for fatigue Respiratory: positive for dyspnea on exertion   PHYSICAL EXAMINATION: General appearance:  alert, cooperative and no distress Head: Normocephalic, without obvious abnormality, atraumatic Neck: no adenopathy, no JVD, supple, symmetrical, trachea midline and thyroid not enlarged, symmetric, no tenderness/mass/nodules Lymph nodes: Cervical, supraclavicular, and axillary nodes normal. Resp: clear to auscultation bilaterally Back: symmetric, no curvature. ROM normal. No CVA tenderness. Cardio: regular rate and rhythm, S1, S2 normal, no murmur, click, rub or gallop GI: soft, non-tender; bowel sounds normal; no masses,  no organomegaly Extremities: extremities normal, atraumatic, no cyanosis or edema  ECOG PERFORMANCE STATUS: 1 - Symptomatic but completely ambulatory  Blood pressure 102/69, pulse 97, temperature 98.7 F (37.1 C), temperature source Oral, resp. rate 18, height _0  (1.702 m), weight 100 lb 6.4 oz (45.5 kg), SpO2 100 %.  LABORATORY DATA: Lab Results  Component Value Date   WBC 5.9 10/07/2018   HGB 11.1 (L) 10/07/2018   HCT 35.0 (L) 10/07/2018   MCV 90.9 10/07/2018   PLT 334 10/07/2018      Chemistry      Component Value Date/Time   NA 137 09/23/2018 1127   K 3.9 09/23/2018 1127   CL 101 09/23/2018 1127   CO2 22 09/23/2018 1127   BUN 8 09/23/2018 1127   CREATININE 0.77 09/23/2018 1127      Component Value Date/Time   CALCIUM 9.6 09/23/2018 1127   ALKPHOS 88 09/23/2018 1127   AST 13 (L) 09/23/2018 1127   ALT 10 09/23/2018 1127   BILITOT 0.3 09/23/2018 1127       RADIOGRAPHIC STUDIES: No results found.  ASSESSMENT AND PLAN: This is a very pleasant 63 years old white male recently diagnosed with a stage IIIb non-small cell lung cancer.   The patient completed a course of concurrent chemoradiation with weekly carboplatin and paclitaxel and tolerated his treatment well with partial response. He is currently undergoing consolidation treatment with immunotherapy with Imfinzi 10 mg/KG every 2 weeks status post 5 cycles.  The patient continues to tolerate  this treatment well with no concerning adverse effects. I recommended for him to proceed with cycle #6 today. I will see the patient back for follow-up visit in 2 weeks for evaluation before starting cycle #7 with repeat CT scan of the chest for restaging of his disease.. The patient was advised to call immediately if he has any concerning symptoms in the interval. The patient voices understanding of current disease status and treatment options and is in agreement with the current care plan. All questions were answered. The patient knows to call the clinic with any problems, questions or concerns. We can certainly see the patient much sooner if necessary.  Disclaimer: This note was dictated with voice recognition software. Similar sounding words can inadvertently be transcribed and may not be corrected upon review.

## 2018-10-08 ENCOUNTER — Other Ambulatory Visit: Payer: Self-pay

## 2018-10-08 ENCOUNTER — Ambulatory Visit: Payer: Self-pay

## 2018-10-08 ENCOUNTER — Ambulatory Visit: Payer: Self-pay | Admitting: Internal Medicine

## 2018-10-09 NOTE — Progress Notes (Signed)
Warren Blankenship is a 63 year old male with a history of a stage IIIb non-small cell lung cancer, poorly differentiated carcinoma favoring an adenocarcinoma.  He was seen in the infusion room today as he was receiving cycle 3, day 9 of Imfinzi.  He was seen for avulsion of his fingernails and toenails.  He has several areas of erythema and tenderness around the cuticles.  He denies fevers, chills, or sweats.  He was given a prescription for Bactroban ointment Hibiclens to dilute with water to make a 50% mixture in which he can soak his fingers and toes.  Sandi Mealy, MHS, PA-C Physician Assistant

## 2018-10-14 ENCOUNTER — Ambulatory Visit: Payer: Self-pay | Admitting: Pulmonary Disease

## 2018-10-14 ENCOUNTER — Encounter: Payer: Self-pay | Admitting: Acute Care

## 2018-10-14 ENCOUNTER — Ambulatory Visit (INDEPENDENT_AMBULATORY_CARE_PROVIDER_SITE_OTHER): Payer: Self-pay | Admitting: Acute Care

## 2018-10-14 ENCOUNTER — Other Ambulatory Visit: Payer: Self-pay | Admitting: Internal Medicine

## 2018-10-14 DIAGNOSIS — F17200 Nicotine dependence, unspecified, uncomplicated: Secondary | ICD-10-CM

## 2018-10-14 DIAGNOSIS — C3491 Malignant neoplasm of unspecified part of right bronchus or lung: Secondary | ICD-10-CM

## 2018-10-14 DIAGNOSIS — E43 Unspecified severe protein-calorie malnutrition: Secondary | ICD-10-CM

## 2018-10-14 DIAGNOSIS — F17210 Nicotine dependence, cigarettes, uncomplicated: Secondary | ICD-10-CM

## 2018-10-14 DIAGNOSIS — R0609 Other forms of dyspnea: Secondary | ICD-10-CM | POA: Insufficient documentation

## 2018-10-14 NOTE — Assessment & Plan Note (Signed)
Continues to smoke Plan I have spent 4 minutes counseling patient on smoking cessation this visit. Patient verbalizes understanding of their  choice to continue smoking and the negative health consequences including worsening of COPD, risk of lung cancer , stroke and heart disease.

## 2018-10-14 NOTE — Progress Notes (Signed)
PCCM: Agreed. Thanks for seeing them. [facility] 10/14/2018 3:15 PM

## 2018-10-14 NOTE — Patient Instructions (Addendum)
It is good to see you today. We will give you some samples of Anoro, and we will give you paperwork for financial assistance for the medication. Use this one puff once daily. Rinse mouth after use Continue using your Albuterol nebs as needed for shortness of breath up to 3 times daily Continue Boost supplement for weight gain. Remember to practice social distancing and hand washing, limit exposure to people as able. Work on quitting smoking. This is the single most powerful action you can take to decrease tour risk of heart disease, stroke and lung cancer. Follow up with Dr. Valeta Harms in 3 months Please contact office for sooner follow up if symptoms do not improve or worsen or seek emergency care

## 2018-10-14 NOTE — Assessment & Plan Note (Signed)
Completed concurrent chemo/ radiation Continues to get immunotherapy with Imfinzi (Durvalumab) 10 mg/KG every 2 weeks through Oncology, Dr. Earlie Server Plan: CT chest 3/23 as follow up to chemo/ immunotherapy Rest per oncology Follow up with Dr. Valeta Harms in 3 months Please contact office for sooner follow up if symptoms do not improve or worsen or seek emergency care

## 2018-10-14 NOTE — Assessment & Plan Note (Signed)
Felt Anoro helped with his dyspnea Using Albuterol nebs prn Plan We will give you some samples of Anoro, and we will give you paperwork for financial assistance for the medication through prescription . Use this one puff once daily. Rinse mouth after use Continue using your Albuterol nebs as needed for shortness of breath up to 3 times daily Remember to practice social distancing and hand washing, limit exposure to people as able. You are at high risk as you have underlying pulmonary conditions and are receiving treatment for your cancer. Work on quitting smoking. This is the single most powerful action you can take to decrease tour risk of heart disease, stroke and lung cancer. Follow up with Dr. Valeta Harms in 3 months Please contact office for sooner follow up if symptoms do not improve or worsen or seek emergency care

## 2018-10-14 NOTE — Assessment & Plan Note (Signed)
Continue Boost as nutritional supplement daily Monitor weight, notify Dr. Valeta Harms  or Dr. Earlie Server is you note additional weight loss. Follow up in 3 months with Dr. Valeta Harms, or before if needed.

## 2018-10-14 NOTE — Progress Notes (Signed)
History of Present Illness Warren Blankenship is a 63 y.o. male with stage IIIb non-small cell lung cancer.Currently undergoing concomitant chemo XRT.  He is followed by Dr. Valeta Harms.   10/14/2018  3 month follow up after bronchoscopy and biopsy of large right sided mediastinal, paratracheal mass, with associated mediastinal and hilar adenopathy. Pathology was positive for stage IIIb non-small cell lung cancer.Pt. states he has been doing well. He has completed concurrent chemo/ radiation , but will continue immunotherapy through 07/2019. Current therapy through oncology is  immunotherapy with Imfinzi (Durvalumab) 10 mg/KG every 2 weeks.He states his breathing is pretty good with the exception of exertional dyspnea with long distances. He is using his Albuterol nebs as needed for this. Pt. Has a CT scan scheduled for Monday of next week per Dr. Earlie Server. He denies fever, chest pain, orthopnea, of hemoptysis. He denies any leg or calf pain. He is continuing to smoke, which I have counseled him against. He did like the Anoro Ellipta , and would like a prescription. He felt it helped with his exertional dyspnea. His weight is stable, he has lost one pound over the last 3 months. He is using Boost supplement and he is taking a daily multivitamin. He is also asking for a Handicap Placard , which we will complete paperwork for qualification.  Test Results: CT Chest 07/16/2018 Improving bulky thoracic lymphadenopathy, as above. Mild postobstructive opacity in the suprahilar region/medial right upper lobe, similar.  Patient is scheduled for CT Chest  Follow up 10/21/2018  CBC Latest Ref Rng & Units 10/07/2018 09/23/2018 09/09/2018  WBC 4.0 - 10.5 K/uL 5.9 5.0 4.8  Hemoglobin 13.0 - 17.0 g/dL 11.1(L) 11.3(L) 12.4(L)  Hematocrit 39.0 - 52.0 % 35.0(L) 35.8(L) 40.3  Platelets 150 - 400 K/uL 334 303 278    BMP Latest Ref Rng & Units 10/07/2018 09/23/2018 09/09/2018  Glucose 70 - 99 mg/dL 82 141(H) 78  BUN 8 - 23  mg/dL 11 8 11   Creatinine 0.61 - 1.24 mg/dL 0.64 0.77 0.76  Sodium 135 - 145 mmol/L 134(L) 137 137  Potassium 3.5 - 5.1 mmol/L 3.8 3.9 4.6  Chloride 98 - 111 mmol/L 99 101 99  CO2 22 - 32 mmol/L 26 22 29   Calcium 8.9 - 10.3 mg/dL 9.3 9.6 10.1    BNP No results found for: BNP  ProBNP No results found for: PROBNP  PFT No results found for: FEV1PRE, FEV1POST, FVCPRE, FVCPOST, TLC, DLCOUNC, PREFEV1FVCRT, PSTFEV1FVCRT  No results found.   Past medical hx Past Medical History:  Diagnosis Date  . Mass of right lung   . Tobacco abuse      Social History   Tobacco Use  . Smoking status: Current Every Day Smoker    Packs/day: 1.00  . Smokeless tobacco: Never Used  . Tobacco comment: 10 cigarettes per day 11.5.19 BB LPN  Substance Use Topics  . Alcohol use: Yes    Comment: Former   . Drug use: Not Currently    [name] reports that he has been smoking. He has been smoking about 1.00 pack per day. He has never used smokeless tobacco. He reports current alcohol use. He reports previous drug use.  Tobacco Cessation: Continues to smoke 10 cigarettes daily I have spent 4 minutes counseling patient on smoking cessation this visit. Patient verbalizes understanding of their  choice to continue smoking and the negative health consequences including worsening of COPD, risk of lung cancer , stroke and heart disease.Marland Kitchen    Past surgical hx,  Family hx, Social hx all reviewed.  Current Outpatient Medications on File Prior to Visit  Medication Sig  . acetaminophen (TYLENOL) 325 MG tablet Take 2 tablets (650 mg total) by mouth every 6 (six) hours as needed for mild pain (or Fever >/= 101).  Marland Kitchen albuterol (PROVENTIL) (2.5 MG/3ML) 0.083% nebulizer solution Take 3 mLs (2.5 mg total) by nebulization every 4 (four) hours as needed for wheezing or shortness of breath.  . chlorhexidine (HIBICLENS) 4 % external liquid Apply topically daily as needed. Mix with warm water in 50/50 ratio and soak  fingernails and toenails twice daily  . lactose free nutrition (BOOST PLUS) LIQD Take 237 mLs by mouth 3 (three) times daily between meals.  . Multiple Vitamin (MULTIVITAMIN WITH MINERALS) TABS tablet Take 1 tablet by mouth daily.  . mupirocin ointment (BACTROBAN) 2 % Apply to the area around the toe and finger nails three times daily  . polyethylene glycol (MIRALAX / GLYCOLAX) packet Take 17 g by mouth daily as needed for mild constipation.  . prochlorperazine (COMPAZINE) 10 MG tablet TAKE 1 TABLET BY MOUTH EVERY 6 HOURS AS NEEDED FOR NAUSEA OR VOMITING  . umeclidinium-vilanterol (ANORO ELLIPTA) 62.5-25 MCG/INH AEPB Inhale 1 puff into the lungs daily. (Patient not taking: Reported on 10/14/2018)   No current facility-administered medications on file prior to visit.      No Known Allergies  Review Of Systems:  Constitutional:   +  weight loss, No night sweats,  Fevers, chills, fatigue, or  lassitude.  HEENT:   No headaches,  Difficulty swallowing,  Tooth/dental problems, or  Sore throat,                No sneezing, itching, ear ache, nasal congestion, post nasal drip,   CV:  No chest pain,  Orthopnea, PND, swelling in lower extremities, anasarca, dizziness, palpitations, syncope.   GI  No heartburn, indigestion, abdominal pain, nausea, vomiting, diarrhea, change in bowel habits, loss of appetite, bloody stools.   Resp: + shortness of breath with exertion or at rest.  No excess mucus, no productive cough,  + non-productive cough,  No coughing up of blood.  No change in color of mucus.  Occasional  wheezing.  No chest wall deformity  Skin: no rash or lesions, warm and dry.  GU: no dysuria, change in color of urine, no urgency or frequency.  No flank pain, no hematuria   MS:  No joint pain or swelling.  No decreased range of motion.  No back pain. Decreased muscle bulk  Psych:  No change in mood or affect. No depression or anxiety.  No memory loss.   Vital Signs BP 100/72 (BP  Location: Left Arm, Cuff Size: Normal)   Pulse (!) 108   Ht 5\' 7"  (1.702 m)   Wt 96 lb 12.8 oz (43.9 kg)   SpO2 98%   BMI 15.16 kg/m    Physical Exam:  General- No distress,  A&Ox3, frail elderly male on RA ENT: No sinus tenderness, TM clear, pale nasal mucosa, no oral exudate,no post nasal drip, no LAN Cardiac: S1, S2, regular rate and rhythm, no murmur Chest: No wheeze/ rales/ dullness; no accessory muscle use, no nasal flaring, no sternal retractions Abd.: Soft Non-tender, ND, very thin, Body mass index is 15.16 kg/m. Ext: No clubbing cyanosis, edema Neuro:  MAE x 4, A&O x 3, Frail and deconditioned, walks with walker Skin: No rashes, warm and dry, thin Psych: normal mood and behavior   Assessment/Plan  Dyspnea  on exertion Felt Anoro helped with his dyspnea Using Albuterol nebs prn Plan We will give you some samples of Anoro, and we will give you paperwork for financial assistance for the medication through prescription . Use this one puff once daily. Rinse mouth after use Continue using your Albuterol nebs as needed for shortness of breath up to 3 times daily Remember to practice social distancing and hand washing, limit exposure to people as able. You are at high risk as you have underlying pulmonary conditions and are receiving treatment for your cancer. Work on quitting smoking. This is the single most powerful action you can take to decrease tour risk of heart disease, stroke and lung cancer. Follow up with Dr. Valeta Harms in 3 months Please contact office for sooner follow up if symptoms do not improve or worsen or seek emergency care    Stage III squamous cell carcinoma of right lung [facility] [place]) Completed concurrent chemo/ radiation Continues to get immunotherapy with Imfinzi (Durvalumab) 10 mg/KG every 2 weeks through Oncology, Dr. Earlie Server Plan: CT chest 3/23 as follow up to chemo/ immunotherapy Rest per oncology Follow up with Dr. Valeta Harms in 3 months Please contact  office for sooner follow up if symptoms do not improve or worsen or seek emergency care    Tobacco use disorder Continues to smoke Plan I have spent 4 minutes counseling patient on smoking cessation this visit. Patient verbalizes understanding of their  choice to continue smoking and the negative health consequences including worsening of COPD, risk of lung cancer , stroke and heart disease.    Severe protein-calorie malnutrition [name]: less than 60% of standard weight) (HCC) Continue Boost as nutritional supplement daily Monitor weight, notify Dr. Valeta Harms  or Dr. Earlie Server is you note additional weight loss. Follow up in 3 months with Dr. Valeta Harms, or before if needed.     Magdalen Spatz, NP 10/14/2018  2:03 PM

## 2018-10-18 ENCOUNTER — Other Ambulatory Visit: Payer: Self-pay | Admitting: *Deleted

## 2018-10-18 DIAGNOSIS — C3491 Malignant neoplasm of unspecified part of right bronchus or lung: Secondary | ICD-10-CM

## 2018-10-21 ENCOUNTER — Inpatient Hospital Stay: Payer: Medicaid Other

## 2018-10-21 ENCOUNTER — Inpatient Hospital Stay: Payer: Medicaid Other | Admitting: Nutrition

## 2018-10-21 ENCOUNTER — Encounter: Payer: Self-pay | Admitting: Internal Medicine

## 2018-10-21 ENCOUNTER — Ambulatory Visit (HOSPITAL_COMMUNITY): Admission: RE | Admit: 2018-10-21 | Payer: Self-pay | Source: Ambulatory Visit

## 2018-10-21 ENCOUNTER — Inpatient Hospital Stay (HOSPITAL_BASED_OUTPATIENT_CLINIC_OR_DEPARTMENT_OTHER): Payer: Medicaid Other | Admitting: Internal Medicine

## 2018-10-21 ENCOUNTER — Other Ambulatory Visit: Payer: Self-pay

## 2018-10-21 VITALS — HR 104

## 2018-10-21 VITALS — BP 101/65 | HR 106 | Temp 98.7°F | Resp 18 | Ht 67.0 in | Wt 98.4 lb

## 2018-10-21 DIAGNOSIS — C3491 Malignant neoplasm of unspecified part of right bronchus or lung: Secondary | ICD-10-CM

## 2018-10-21 DIAGNOSIS — C771 Secondary and unspecified malignant neoplasm of intrathoracic lymph nodes: Secondary | ICD-10-CM

## 2018-10-21 DIAGNOSIS — C342 Malignant neoplasm of middle lobe, bronchus or lung: Secondary | ICD-10-CM

## 2018-10-21 DIAGNOSIS — Z5112 Encounter for antineoplastic immunotherapy: Secondary | ICD-10-CM | POA: Diagnosis not present

## 2018-10-21 DIAGNOSIS — Z720 Tobacco use: Secondary | ICD-10-CM

## 2018-10-21 DIAGNOSIS — Z79899 Other long term (current) drug therapy: Secondary | ICD-10-CM

## 2018-10-21 DIAGNOSIS — C3411 Malignant neoplasm of upper lobe, right bronchus or lung: Secondary | ICD-10-CM

## 2018-10-21 DIAGNOSIS — Z5111 Encounter for antineoplastic chemotherapy: Secondary | ICD-10-CM

## 2018-10-21 LAB — CMP (CANCER CENTER ONLY)
ALT: 14 U/L (ref 0–44)
AST: 15 U/L (ref 15–41)
Albumin: 2.5 g/dL — ABNORMAL LOW (ref 3.5–5.0)
Alkaline Phosphatase: 99 U/L (ref 38–126)
Anion gap: 15 (ref 5–15)
BUN: 11 mg/dL (ref 8–23)
CO2: 22 mmol/L (ref 22–32)
Calcium: 10 mg/dL (ref 8.9–10.3)
Chloride: 97 mmol/L — ABNORMAL LOW (ref 98–111)
Creatinine: 0.83 mg/dL (ref 0.61–1.24)
GFR, Estimated: >60 mL/min (ref >60)
Glucose, Bld: 146 mg/dL — ABNORMAL HIGH (ref 70–99)
Potassium: 4 mmol/L (ref 3.5–5.1)
Sodium: 134 mmol/L — ABNORMAL LOW (ref 135–145)
Total Bilirubin: 0.4 mg/dL (ref 0.3–1.2)
Total Protein: 7.8 g/dL (ref 6.5–8.1)

## 2018-10-21 LAB — CBC WITH DIFFERENTIAL (CANCER CENTER ONLY)
ABS IMMATURE GRANULOCYTES: 0.02 10*3/uL (ref 0.00–0.07)
Basophils Absolute: 0.1 10*3/uL (ref 0.0–0.1)
Basophils Relative: 1 %
Eosinophils Absolute: 0.1 10*3/uL (ref 0.0–0.5)
Eosinophils Relative: 1 %
HCT: 36.4 % — ABNORMAL LOW (ref 39.0–52.0)
HEMOGLOBIN: 11.7 g/dL — AB (ref 13.0–17.0)
Immature Granulocytes: 0 %
LYMPHS PCT: 12 %
Lymphs Abs: 0.8 10*3/uL (ref 0.7–4.0)
MCH: 28.7 pg (ref 26.0–34.0)
MCHC: 32.1 g/dL (ref 30.0–36.0)
MCV: 89.4 fL (ref 80.0–100.0)
Monocytes Absolute: 0.8 10*3/uL (ref 0.1–1.0)
Monocytes Relative: 11 %
NEUTROS ABS: 5.2 10*3/uL (ref 1.7–7.7)
NEUTROS PCT: 75 %
Platelet Count: 361 10*3/uL (ref 150–400)
RBC: 4.07 MIL/uL — ABNORMAL LOW (ref 4.22–5.81)
RDW: 14.6 % (ref 11.5–15.5)
WBC Count: 7 10*3/uL (ref 4.0–10.5)
nRBC: 0 % (ref 0.0–0.2)

## 2018-10-21 LAB — TSH: TSH: 1.058 u[IU]/mL (ref 0.320–4.118)

## 2018-10-21 MED ORDER — SODIUM CHLORIDE 0.9 % IV SOLN
480.0000 mg | Freq: Once | INTRAVENOUS | Status: AC
  Administered 2018-10-21: 480 mg via INTRAVENOUS
  Filled 2018-10-21: qty 9.6

## 2018-10-21 MED ORDER — SODIUM CHLORIDE 0.9 % IV SOLN
Freq: Once | INTRAVENOUS | Status: AC
  Administered 2018-10-21: 11:00:00 via INTRAVENOUS
  Filled 2018-10-21: qty 250

## 2018-10-21 NOTE — Patient Instructions (Addendum)
[place] Discharge Instructions for Patients Receiving Chemotherapy  Today you received the following chemotherapy agents Imfinzi.  To help prevent nausea and vomiting after your treatment, we encourage you to take your nausea medication as directed.  If you develop nausea and vomiting that is not controlled by your nausea medication, call the clinic.   BELOW ARE SYMPTOMS THAT SHOULD BE REPORTED IMMEDIATELY:  *FEVER GREATER THAN 100.5 F  *CHILLS WITH OR WITHOUT FEVER  NAUSEA AND VOMITING THAT IS NOT CONTROLLED WITH YOUR NAUSEA MEDICATION  *UNUSUAL SHORTNESS OF BREATH  *UNUSUAL BRUISING OR BLEEDING  TENDERNESS IN MOUTH AND THROAT WITH OR WITHOUT PRESENCE OF ULCERS  *URINARY PROBLEMS  *BOWEL PROBLEMS  UNUSUAL RASH Items with * indicate a potential emergency and should be followed up as soon as possible.  Feel free to call the clinic should you have any questions or concerns. The clinic phone number is [phone].  Please show the [place] at check-in to the Emergency Department and triage nurse.  \[phone]\

## 2018-10-21 NOTE — Progress Notes (Signed)
Per Dr. Julien Nordmann it is ok to treat with heart rate 104

## 2018-10-21 NOTE — Progress Notes (Signed)
[place] Telephone:([phone]) [phone]   [phone]  OFFICE PROGRESS NOTE  Patient, No Pcp Per No address on file  DIAGNOSIS:Stage IIIB (T4, N2, M0)) non-small cell lung cancer, poorly differentiated carcinoma favoring adenocarcinoma presented with large right hilar and upper lobe lung mass with right hilar and mediastinal lymphadenopathy and compression of right mainstem bronchus as well as right middle lobe bronchus diagnosed in September 2019.  PRIOR THERAPY: Concurrent chemoradiation with weekly carboplatin for AUC of 2 and paclitaxel 45MG/M2. First dose May 14, 2018.Status post6cycles with partial response.  CURRENT THERAPY: Consolidation treatment with immunotherapy with Imfinzi (Durvalumab) 10 mg/KG every 2 weeks.  First dose expected on 07/29/2018.  Status post 6 cycles.  INTERVAL HISTORY: Warren Blankenship 63 y.o. male returns to the clinic today for follow-up visit.  The patient is feeling fine today with no concerning complaints.  He denied having any chest pain, shortness of breath, cough or hemoptysis.  He denied having any fever or chills.  He has no nausea, vomiting, diarrhea or constipation.  He is good and keeps his weight stable.  He denied having any headache or visual changes.  He is here today for evaluation before starting cycle #7.  His CT scan of the chest is scheduled to be done later this week.   MEDICAL HISTORY: Past Medical History:  Diagnosis Date  . Mass of right lung   . Tobacco abuse     ALLERGIES:  has No Known Allergies.  MEDICATIONS:  Current Outpatient Medications  Medication Sig Dispense Refill  . acetaminophen (TYLENOL) 325 MG tablet Take 2 tablets (650 mg total) by mouth every 6 (six) hours as needed for mild pain (or Fever >/= 101).    Marland Kitchen albuterol (PROVENTIL) (2.5 MG/3ML) 0.083% nebulizer solution Take 3 mLs (2.5 mg total) by nebulization every 4 (four) hours as needed for wheezing or shortness of breath. 75  mL 5  . chlorhexidine (HIBICLENS) 4 % external liquid Apply topically daily as needed. Mix with warm water in 50/50 ratio and soak fingernails and toenails twice daily 500 mL 1  . lactose free nutrition (BOOST PLUS) LIQD Take 237 mLs by mouth 3 (three) times daily between meals. 90 Can 0  . Multiple Vitamin (MULTIVITAMIN WITH MINERALS) TABS tablet Take 1 tablet by mouth daily.    . mupirocin ointment (BACTROBAN) 2 % Apply to the area around the toe and finger nails three times daily 30 g 1  . polyethylene glycol (MIRALAX / GLYCOLAX) packet Take 17 g by mouth daily as needed for mild constipation. 14 each 0  . prochlorperazine (COMPAZINE) 10 MG tablet TAKE 1 TABLET BY MOUTH EVERY 6 HOURS AS NEEDED FOR NAUSEA OR VOMITING 30 tablet 0  . umeclidinium-vilanterol (ANORO ELLIPTA) 62.5-25 MCG/INH AEPB Inhale 1 puff into the lungs daily. (Patient not taking: Reported on 10/14/2018) 60 each 0   No current facility-administered medications for this visit.     SURGICAL HISTORY:  Past Surgical History:  Procedure Laterality Date  . BIOPSY  04/29/2018   Procedure: BIOPSY;  Surgeon: Garner Nash, DO;  Location: [facility];  Service: Cardiopulmonary;;  . BRONCHIAL WASHINGS  04/29/2018   Procedure: BRONCHIAL WASHINGS;  Surgeon: Garner Nash, DO;  Location: [facility];  Service: Cardiopulmonary;;  . ENDOBRONCHIAL ULTRASOUND Bilateral 04/29/2018   Procedure: ENDOBRONCHIAL ULTRASOUND;  Surgeon: Garner Nash, DO;  Location: [facility];  Service: Cardiopulmonary;  Laterality: Bilateral;  . Endobronchial Ultrasound (EBUS), Mediastinum  Bilateral 04/29/2018  Dr. Valeta Harms   . FINE NEEDLE ASPIRATION BIOPSY  04/29/2018   Procedure: FINE NEEDLE ASPIRATION BIOPSY;  Surgeon: Garner Nash, DO;  Location: [facility];  Service: Cardiopulmonary;;  . FLEXIBLE BRONCHOSCOPY  04/29/2018   Procedure: FLEXIBLE BRONCHOSCOPY;  Surgeon: Garner Nash, DO;  Location: [facility];  Service: Cardiopulmonary;;  .  TONSILLECTOMY      REVIEW OF SYSTEMS:  A comprehensive review of systems was negative except for: Constitutional: positive for fatigue   PHYSICAL EXAMINATION: General appearance: alert, cooperative and no distress Head: Normocephalic, without obvious abnormality, atraumatic Neck: no adenopathy, no JVD, supple, symmetrical, trachea midline and thyroid not enlarged, symmetric, no tenderness/mass/nodules Lymph nodes: Cervical, supraclavicular, and axillary nodes normal. Resp: clear to auscultation bilaterally Back: symmetric, no curvature. ROM normal. No CVA tenderness. Cardio: regular rate and rhythm, S1, S2 normal, no murmur, click, rub or gallop GI: soft, non-tender; bowel sounds normal; no masses,  no organomegaly Extremities: extremities normal, atraumatic, no cyanosis or edema  ECOG PERFORMANCE STATUS: 1 - Symptomatic but completely ambulatory  Blood pressure 101/65, pulse (!) 106, temperature 98.7 F (37.1 C), temperature source Oral, resp. rate 18, height _0  (1.702 m), weight 98 lb 6.4 oz (44.6 kg), SpO2 100 %.  LABORATORY DATA: Lab Results  Component Value Date   WBC 7.0 10/21/2018   HGB 11.7 (L) 10/21/2018   HCT 36.4 (L) 10/21/2018   MCV 89.4 10/21/2018   PLT 361 10/21/2018      Chemistry      Component Value Date/Time   NA 134 (L) 10/21/2018 0918   K 4.0 10/21/2018 0918   CL 97 (L) 10/21/2018 0918   CO2 22 10/21/2018 0918   BUN 11 10/21/2018 0918   CREATININE 0.83 10/21/2018 0918      Component Value Date/Time   CALCIUM 10.0 10/21/2018 0918   ALKPHOS 99 10/21/2018 0918   AST 15 10/21/2018 0918   ALT 14 10/21/2018 0918   BILITOT 0.4 10/21/2018 0918       RADIOGRAPHIC STUDIES: No results found.  ASSESSMENT AND PLAN: This is a very pleasant 63 years old white male recently diagnosed with a stage IIIb non-small cell lung cancer.   The patient completed a course of concurrent chemoradiation with weekly carboplatin and paclitaxel and tolerated his treatment  well with partial response. He is currently undergoing consolidation treatment with immunotherapy with Imfinzi 10 mg/KG every 2 weeks status post 6 cycles.  The patient is feeling fine today with no concerning complaints. I recommended for him to proceed with cycle #7 today as scheduled. I will see him back for follow-up visit in 2 weeks for evaluation before starting the next cycle of his treatment and after repeating CT scan of the chest for restaging of his disease. The patient was advised to call immediately if he has any concerning symptoms in the interval. The patient voices understanding of current disease status and treatment options and is in agreement with the current care plan. All questions were answered. The patient knows to call the clinic with any problems, questions or concerns. We can certainly see the patient much sooner if necessary.  Disclaimer: This note was dictated with voice recognition software. Similar sounding words can inadvertently be transcribed and may not be corrected upon review.

## 2018-10-21 NOTE — Progress Notes (Signed)
RD working remotely.  Nutrition follow-up completed with patient receiving treatment for non-small cell lung cancer. He received cycle 7 today of a consolidation treatment. Weight decreased and documented as 98.4 pounds on March 23 down from 100.4 pounds March 9. Noted labs: Sodium 134, glucose 146, and albumin 2.5. Patient reports that he is eating well.  He just cannot gain any weight. He is drinking oral nutrition supplements and prefers vanilla flavor. Patient denies nausea, vomiting, constipation, diarrhea. Patient has no other questions or concerns.  Nutrition diagnosis: Underweight continues.  Severe malnutrition continues.  Intervention: Patient was educated to continue small frequent meals utilizing high-calorie, high-protein foods. Encourage patient to continue oral nutrition supplements 2-3 bottles daily. I will mail him coupons.  Monitoring, evaluation, goals: Patient will tolerate increased calories and protein to minimize weight loss.  Next visit: To be seen as needed.  **Disclaimer: This note was dictated with voice recognition software. Similar sounding words can inadvertently be transcribed and this note may contain transcription errors which may not have been corrected upon publication of note.**

## 2018-10-22 ENCOUNTER — Telehealth: Payer: Self-pay | Admitting: Internal Medicine

## 2018-10-22 NOTE — Telephone Encounter (Signed)
Patient already on schedule as requested per 3/23 los.

## 2018-10-23 ENCOUNTER — Ambulatory Visit (HOSPITAL_COMMUNITY)
Admission: RE | Admit: 2018-10-23 | Discharge: 2018-10-23 | Disposition: A | Discharge status: Home / Self Care | Payer: Medicaid Other | Source: Ambulatory Visit | Attending: Internal Medicine | Admitting: Internal Medicine

## 2018-10-23 ENCOUNTER — Other Ambulatory Visit: Payer: Self-pay

## 2018-10-23 ENCOUNTER — Encounter (HOSPITAL_COMMUNITY): Payer: Self-pay

## 2018-10-23 DIAGNOSIS — C3490 Malignant neoplasm of unspecified part of unspecified bronchus or lung: Secondary | ICD-10-CM | POA: Diagnosis present

## 2018-10-23 MED ORDER — SODIUM CHLORIDE (PF) 0.9 % IJ SOLN
INTRAMUSCULAR | Status: AC
  Filled 2018-10-23: qty 50

## 2018-10-23 MED ORDER — IOHEXOL 300 MG/ML  SOLN
75.0000 mL | Freq: Once | INTRAMUSCULAR | Status: AC | PRN
  Administered 2018-10-23: 75 mL via INTRAVENOUS

## 2018-11-01 ENCOUNTER — Other Ambulatory Visit: Payer: Self-pay | Admitting: Physician Assistant

## 2018-11-01 DIAGNOSIS — C3491 Malignant neoplasm of unspecified part of right bronchus or lung: Secondary | ICD-10-CM

## 2018-11-04 ENCOUNTER — Inpatient Hospital Stay: Payer: Self-pay

## 2018-11-04 ENCOUNTER — Inpatient Hospital Stay: Payer: Self-pay | Admitting: Internal Medicine

## 2018-11-05 ENCOUNTER — Ambulatory Visit: Payer: Self-pay | Admitting: Internal Medicine

## 2018-11-05 ENCOUNTER — Other Ambulatory Visit: Payer: Self-pay

## 2018-11-05 ENCOUNTER — Ambulatory Visit: Payer: Self-pay

## 2018-11-19 ENCOUNTER — Telehealth: Payer: Self-pay | Admitting: *Deleted

## 2018-11-19 ENCOUNTER — Inpatient Hospital Stay: Payer: Self-pay

## 2018-11-19 ENCOUNTER — Inpatient Hospital Stay: Payer: Self-pay | Attending: Internal Medicine | Admitting: Internal Medicine

## 2018-11-19 ENCOUNTER — Emergency Department (HOSPITAL_COMMUNITY)
Admission: EM | Admit: 2018-11-19 | Discharge: 2018-11-19 | Disposition: A | Discharge status: Home / Self Care | Payer: Medicaid Other | Attending: Emergency Medicine | Admitting: Emergency Medicine

## 2018-11-19 ENCOUNTER — Emergency Department (HOSPITAL_COMMUNITY): Payer: Medicaid Other

## 2018-11-19 DIAGNOSIS — E860 Dehydration: Secondary | ICD-10-CM | POA: Insufficient documentation

## 2018-11-19 DIAGNOSIS — Z79899 Other long term (current) drug therapy: Secondary | ICD-10-CM | POA: Diagnosis not present

## 2018-11-19 DIAGNOSIS — F17210 Nicotine dependence, cigarettes, uncomplicated: Secondary | ICD-10-CM | POA: Diagnosis not present

## 2018-11-19 DIAGNOSIS — R531 Weakness: Secondary | ICD-10-CM | POA: Diagnosis present

## 2018-11-19 LAB — CBC WITH DIFFERENTIAL/PLATELET
Abs Immature Granulocytes: 0.03 10*3/uL (ref 0.00–0.07)
Basophils Absolute: 0.1 10*3/uL (ref 0.0–0.1)
Basophils Relative: 1 %
Eosinophils Absolute: 0.1 10*3/uL (ref 0.0–0.5)
Eosinophils Relative: 1 %
HCT: 32.9 % — ABNORMAL LOW (ref 39.0–52.0)
Hemoglobin: 10 g/dL — ABNORMAL LOW (ref 13.0–17.0)
Immature Granulocytes: 0 %
Lymphocytes Relative: 10 %
Lymphs Abs: 0.8 10*3/uL (ref 0.7–4.0)
MCH: 27.6 pg (ref 26.0–34.0)
MCHC: 30.4 g/dL (ref 30.0–36.0)
MCV: 90.9 fL (ref 80.0–100.0)
Monocytes Absolute: 0.6 10*3/uL (ref 0.1–1.0)
Monocytes Relative: 8 %
Neutro Abs: 6 10*3/uL (ref 1.7–7.7)
Neutrophils Relative %: 80 %
Platelets: 390 10*3/uL (ref 150–400)
RBC: 3.62 MIL/uL — ABNORMAL LOW (ref 4.22–5.81)
RDW: 15.9 % — ABNORMAL HIGH (ref 11.5–15.5)
WBC: 7.6 10*3/uL (ref 4.0–10.5)
nRBC: 0 % (ref 0.0–0.2)

## 2018-11-19 LAB — COMPREHENSIVE METABOLIC PANEL
ALT: 19 U/L (ref 0–44)
AST: 19 U/L (ref 15–41)
Albumin: 2.7 g/dL — ABNORMAL LOW (ref 3.5–5.0)
Alkaline Phosphatase: 130 U/L — ABNORMAL HIGH (ref 38–126)
Anion gap: 10 (ref 5–15)
BUN: 17 mg/dL (ref 8–23)
CO2: 28 mmol/L (ref 22–32)
Calcium: 9.4 mg/dL (ref 8.9–10.3)
Chloride: 96 mmol/L — ABNORMAL LOW (ref 98–111)
Creatinine, Ser: 0.72 mg/dL (ref 0.61–1.24)
GFR calc Af Amer: >60 mL/min (ref >60)
GFR calc non Af Amer: >60 mL/min (ref >60)
Glucose, Bld: 140 mg/dL — ABNORMAL HIGH (ref 70–99)
Potassium: 3.7 mmol/L (ref 3.5–5.1)
Sodium: 134 mmol/L — ABNORMAL LOW (ref 135–145)
Total Bilirubin: 0.5 mg/dL (ref 0.3–1.2)
Total Protein: 7.8 g/dL (ref 6.5–8.1)

## 2018-11-19 MED ORDER — SODIUM CHLORIDE 0.9 % IV BOLUS
1500.0000 mL | Freq: Once | INTRAVENOUS | Status: AC
  Administered 2018-11-19: 1500 mL via INTRAVENOUS

## 2018-11-19 NOTE — Telephone Encounter (Signed)
Son calling to r/s appt. He reported pt has been discharged from hospital after receiving IVF and had CXR -" COPD without acute abnormality".

## 2018-11-19 NOTE — ED Provider Notes (Signed)
[facility] DEPT Provider Note   CSN: [id number] Arrival date & time: 11/19/18  1103    History   Chief Complaint Chief Complaint  Patient presents with  . Weakness    HPI Warren Blankenship is a 63 y.o. male.     HPI 63 year old male who presents to the emergency department complaints of generalized weakness over the past month.  He has a history of lung cancer status post radiation and chemotherapy.  He finished his treatments and early January 2020.  He reports mild decreased oral intake but is doing his best to keep up with food and fluids.  He is drinking Ensure and boost.  He states his normal blood pressures in the 90s.  He denies cough or congestion.  No known contact with COVID-19 patients or patients under investigation for COVID-19.  Blood sugar 126 for EMS.  Denies back pain.  States he is just generally weak all over.  Denies focal weakness.  No difficulty with speech.  No headache.   Past Medical History:  Diagnosis Date  . Mass of right lung   . Tobacco abuse     Patient Active Problem List   Diagnosis Date Noted  . Dyspnea on exertion 10/14/2018  . Encounter for antineoplastic immunotherapy 07/17/2018  . Goals of care, counseling/discussion 05/14/2018  . Encounter for antineoplastic chemotherapy 05/14/2018  . Stage III squamous cell carcinoma of right lung ([place]) 05/10/2018  . Anemia of chronic disease 05/01/2018  . Hypotension 04/29/2018  . Severe protein-calorie malnutrition [name]: less than 60% of standard weight) ([place]) 04/29/2018  . Hyponatremia 04/29/2018  . Hypokalemia 04/29/2018  . Mediastinal adenopathy   . Hilar adenopathy   . Malignant neoplasm of middle lobe, bronchus or lung ([place]) 04/12/2018  . Transient hypotension 04/12/2018  . Normocytic normochromic anemia 04/12/2018  . Thrombocytosis ([place]) 04/12/2018  . Tobacco use disorder 04/12/2018  . ARF (acute renal failure) ([place]) 03/19/2018  . FTT (failure to thrive)  in adult 03/19/2018  . Hyperkalemia 03/19/2018  . Cachexia ([place]) 03/19/2018  . Lactic acidosis 03/19/2018  . Palpitation 03/19/2018    Past Surgical History:  Procedure Laterality Date  . BIOPSY  04/29/2018   Procedure: BIOPSY;  Surgeon: Garner Nash, DO;  Location: [facility];  Service: Cardiopulmonary;;  . BRONCHIAL WASHINGS  04/29/2018   Procedure: BRONCHIAL WASHINGS;  Surgeon: Garner Nash, DO;  Location: [facility];  Service: Cardiopulmonary;;  . ENDOBRONCHIAL ULTRASOUND Bilateral 04/29/2018   Procedure: ENDOBRONCHIAL ULTRASOUND;  Surgeon: Garner Nash, DO;  Location: [facility];  Service: Cardiopulmonary;  Laterality: Bilateral;  . Endobronchial Ultrasound (EBUS), Mediastinum  Bilateral 04/29/2018   Dr. Valeta Harms   . FINE NEEDLE ASPIRATION BIOPSY  04/29/2018   Procedure: FINE NEEDLE ASPIRATION BIOPSY;  Surgeon: Garner Nash, DO;  Location: [facility];  Service: Cardiopulmonary;;  . FLEXIBLE BRONCHOSCOPY  04/29/2018   Procedure: FLEXIBLE BRONCHOSCOPY;  Surgeon: Garner Nash, DO;  Location: [facility];  Service: Cardiopulmonary;;  . TONSILLECTOMY          Home Medications    Prior to Admission medications   Medication Sig Start Date End Date Taking? Authorizing Provider  acetaminophen (TYLENOL) 325 MG tablet Take 2 tablets (650 mg total) by mouth every 6 (six) hours as needed for mild pain (or Fever >/= 101). 05/02/18  Yes Debbe Odea, MD  chlorhexidine (HIBICLENS) 4 % external liquid Apply topically daily as needed. Mix with warm water in 50/50 ratio and soak fingernails and toenails twice daily  Patient taking differently: Apply 1 application topically daily as needed (toes/daily). Mix with warm water in 50/50 ratio and soak fingernails and toenails twice daily 10/07/18  Yes Tanner, Lyndon Code., PA-C  lactose free nutrition (BOOST PLUS) LIQD Take 237 mLs by mouth 3 (three) times daily between meals. Patient taking differently: Take 237 mLs by mouth daily.   05/03/18  Yes Debbe Odea, MD  Multiple Vitamin (MULTIVITAMIN WITH MINERALS) TABS tablet Take 1 tablet by mouth daily. 04/14/18  Yes Georgette Shell, MD  mupirocin ointment (BACTROBAN) 2 % Apply to the area around the toe and finger nails three times daily Patient taking differently: Apply 1 application topically 3 (three) times daily as needed (toes/nails). Apply to the area around the toe and finger nails three times daily 10/07/18  Yes Tanner, Lyndon Code., PA-C  prochlorperazine (COMPAZINE) 10 MG tablet TAKE 1 TABLET BY MOUTH EVERY 6 HOURS AS NEEDED FOR NAUSEA OR VOMITING Patient taking differently: Take 10 mg by mouth every 6 (six) hours as needed for nausea or vomiting.  10/14/18  Yes Curt Bears, MD  albuterol (PROVENTIL) (2.5 MG/3ML) 0.083% nebulizer solution Take 3 mLs (2.5 mg total) by nebulization every 4 (four) hours as needed for wheezing or shortness of breath. Patient not taking: Reported on 11/19/2018 06/25/18   June Leap L, DO  polyethylene glycol (MIRALAX / GLYCOLAX) packet Take 17 g by mouth daily as needed for mild constipation. Patient not taking: Reported on 11/19/2018 05/03/18   Debbe Odea, MD  umeclidinium-vilanterol (ANORO ELLIPTA) 62.5-25 MCG/INH AEPB Inhale 1 puff into the lungs daily. Patient not taking: Reported on 10/14/2018 06/04/18   Garner Nash, DO    Family History Family History  Problem Relation Age of Onset  . Lung disease Father     Social History Social History   Tobacco Use  . Smoking status: Current Every Day Smoker    Packs/day: 1.00  . Smokeless tobacco: Never Used  . Tobacco comment: 10 cigarettes per day 11.5.19 BB LPN  Substance Use Topics  . Alcohol use: Yes    Comment: Former   . Drug use: Not Currently     Allergies   Patient has no known allergies.   Review of Systems Review of Systems  All other systems reviewed and are negative.    Physical Exam Updated Vital Signs BP (!) 94/57   Pulse 86   Temp 98.3 F  (36.8 C) (Oral)   Resp (!) 26   SpO2 100%   Physical Exam Vitals signs and nursing note reviewed.  Constitutional:      Appearance: He is well-developed.  HENT:     Head: Normocephalic and atraumatic.  Neck:     Musculoskeletal: Normal range of motion.  Cardiovascular:     Rate and Rhythm: Normal rate and regular rhythm.     Heart sounds: Normal heart sounds.  Pulmonary:     Effort: Pulmonary effort is normal. No respiratory distress.     Breath sounds: Normal breath sounds.  Abdominal:     General: There is no distension.     Palpations: Abdomen is soft.     Tenderness: There is no abdominal tenderness.  Musculoskeletal: Normal range of motion.  Skin:    General: Skin is warm and dry.  Neurological:     Mental Status: He is alert and oriented to person, place, and time.  Psychiatric:        Judgment: Judgment normal.      ED Treatments / Results  Labs (all labs ordered are listed, but only abnormal results are displayed) Labs Reviewed  CBC WITH DIFFERENTIAL/PLATELET - Abnormal; Notable for the following components:      Result Value   RBC 3.62 (*)    Hemoglobin 10.0 (*)    HCT 32.9 (*)    RDW 15.9 (*)    All other components within normal limits  COMPREHENSIVE METABOLIC PANEL - Abnormal; Notable for the following components:   Sodium 134 (*)    Chloride 96 (*)    Glucose, Bld 140 (*)    Albumin 2.7 (*)    Alkaline Phosphatase 130 (*)    All other components within normal limits  URINALYSIS, ROUTINE W REFLEX MICROSCOPIC    EKG EKG Interpretation  Date/Time:  Tuesday November 19 2018 11:16:33 EDT Ventricular Rate:  109 PR Interval:    QRS Duration: 92 QT Interval:  339 QTC Calculation: 457 R Axis:   85 Text Interpretation:  Sinus tachycardia Consider right atrial enlargement Borderline right axis deviation No significant change was found Confirmed by Jola Schmidt [phone]) on 11/19/2018 2:51:28 PM   Radiology Dg Chest 2 View  Result Date: 11/19/2018  CLINICAL DATA:  Generalized weakness, history of lung carcinoma EXAM: CHEST - 2 VIEW COMPARISON:  10/23/2018 FINDINGS: Cardiac shadows within normal limits. Aortic calcifications are again seen. The lungs are mildly hyperinflated consistent with COPD. No focal infiltrate or sizable effusion is noted. No acute bony abnormality is seen. IMPRESSION: COPD without acute abnormality. Electronically Signed   By: Inez Catalina M.D.   On: 11/19/2018 13:00    Procedures Procedures (including critical care time)  Medications Ordered in ED Medications  sodium chloride 0.9 % bolus 1,500 mL (1,500 mLs Intravenous New Bag/Given 11/19/18 1135)     Initial Impression / Assessment and Plan / ED Course  I have reviewed the triage vital signs and the nursing notes.  Pertinent labs & imaging results that were available during my care of the patient were reviewed by me and considered in my medical decision making (see chart for details).       Overall the patient is well-appearing.  He feels much better after IV fluids.  Suspect dehydration.  Labs and x-ray otherwise without abnormality.  He will follow back up with his cancer team.  No indication for additional work-up at this time.  Final Clinical Impressions(s) / ED Diagnoses   Final diagnoses:  Acute dehydration    ED Discharge Orders    None       Jola Schmidt, MD 11/19/18 1515

## 2018-11-19 NOTE — Telephone Encounter (Signed)
"   fell 5 times, legs not working right ". Pt being transported to [facility] long for evaluation.

## 2018-11-19 NOTE — Telephone Encounter (Signed)
Sloan Leiter and son Lynnae Sandhoff called reporting "Returning call from [place] about today's appointment." Pt. Reports he's "feeling fairly decent." Transferred to Nurse Tech.

## 2018-11-19 NOTE — ED Notes (Signed)
Bed: WA21 Expected date:  Expected time:  Means of arrival:  Comments: EMS generalized weakness, has been unable to get out of bed

## 2018-11-19 NOTE — Discharge Instructions (Addendum)
Continue to hydrate yourself.  Call your cancer team for follow up  Return to the ER as needed for new or worsening symptoms

## 2018-11-19 NOTE — ED Notes (Signed)
ED Provider at bedside. 

## 2018-11-19 NOTE — Telephone Encounter (Signed)
He can skip this cycle and come back in 2 weeks as previously scheduled.

## 2018-11-19 NOTE — ED Triage Notes (Signed)
Transported by [facility] from home-- generalized weakness x 1 month. Denies any n/v/d or pain. Hx of lung cancer. CBG read 126 and VSS with EMS.

## 2018-11-20 NOTE — Telephone Encounter (Signed)
I LVM with Dr Lew Dawes  instructions.

## 2018-11-24 ENCOUNTER — Encounter (HOSPITAL_COMMUNITY): Payer: Self-pay | Admitting: *Deleted

## 2018-11-24 ENCOUNTER — Emergency Department (HOSPITAL_COMMUNITY): Payer: Medicaid Other

## 2018-11-24 ENCOUNTER — Other Ambulatory Visit: Payer: Self-pay

## 2018-11-24 ENCOUNTER — Inpatient Hospital Stay (HOSPITAL_COMMUNITY)
Admission: EM | Admit: 2018-11-24 | Discharge: 2018-11-29 | DRG: 314 | Disposition: A | Discharge status: Hospice | Payer: Medicaid Other | Attending: Internal Medicine | Admitting: Internal Medicine

## 2018-11-24 DIAGNOSIS — Z79899 Other long term (current) drug therapy: Secondary | ICD-10-CM

## 2018-11-24 DIAGNOSIS — E43 Unspecified severe protein-calorie malnutrition: Secondary | ICD-10-CM | POA: Diagnosis present

## 2018-11-24 DIAGNOSIS — J439 Emphysema, unspecified: Secondary | ICD-10-CM | POA: Diagnosis present

## 2018-11-24 DIAGNOSIS — R21 Rash and other nonspecific skin eruption: Secondary | ICD-10-CM

## 2018-11-24 DIAGNOSIS — W19XXXA Unspecified fall, initial encounter: Secondary | ICD-10-CM | POA: Diagnosis present

## 2018-11-24 DIAGNOSIS — M25432 Effusion, left wrist: Secondary | ICD-10-CM

## 2018-11-24 DIAGNOSIS — C3491 Malignant neoplasm of unspecified part of right bronchus or lung: Secondary | ICD-10-CM | POA: Diagnosis present

## 2018-11-24 DIAGNOSIS — Z9221 Personal history of antineoplastic chemotherapy: Secondary | ICD-10-CM

## 2018-11-24 DIAGNOSIS — Z923 Personal history of irradiation: Secondary | ICD-10-CM

## 2018-11-24 DIAGNOSIS — D649 Anemia, unspecified: Secondary | ICD-10-CM | POA: Diagnosis present

## 2018-11-24 DIAGNOSIS — D638 Anemia in other chronic diseases classified elsewhere: Secondary | ICD-10-CM | POA: Diagnosis present

## 2018-11-24 DIAGNOSIS — I959 Hypotension, unspecified: Principal | ICD-10-CM

## 2018-11-24 DIAGNOSIS — L859 Epidermal thickening, unspecified: Secondary | ICD-10-CM | POA: Diagnosis present

## 2018-11-24 DIAGNOSIS — R531 Weakness: Secondary | ICD-10-CM

## 2018-11-24 DIAGNOSIS — C7972 Secondary malignant neoplasm of left adrenal gland: Secondary | ICD-10-CM | POA: Diagnosis present

## 2018-11-24 DIAGNOSIS — M25532 Pain in left wrist: Secondary | ICD-10-CM | POA: Diagnosis present

## 2018-11-24 DIAGNOSIS — Z66 Do not resuscitate: Secondary | ICD-10-CM | POA: Diagnosis present

## 2018-11-24 DIAGNOSIS — S52615A Nondisplaced fracture of left ulna styloid process, initial encounter for closed fracture: Secondary | ICD-10-CM | POA: Diagnosis present

## 2018-11-24 DIAGNOSIS — R627 Adult failure to thrive: Secondary | ICD-10-CM | POA: Diagnosis present

## 2018-11-24 DIAGNOSIS — D630 Anemia in neoplastic disease: Secondary | ICD-10-CM | POA: Diagnosis present

## 2018-11-24 DIAGNOSIS — Z9181 History of falling: Secondary | ICD-10-CM

## 2018-11-24 DIAGNOSIS — Z681 Body mass index (BMI) 19 or less, adult: Secondary | ICD-10-CM

## 2018-11-24 DIAGNOSIS — C770 Secondary and unspecified malignant neoplasm of lymph nodes of head, face and neck: Secondary | ICD-10-CM | POA: Diagnosis present

## 2018-11-24 DIAGNOSIS — F17210 Nicotine dependence, cigarettes, uncomplicated: Secondary | ICD-10-CM | POA: Diagnosis present

## 2018-11-24 DIAGNOSIS — B370 Candidal stomatitis: Secondary | ICD-10-CM | POA: Diagnosis present

## 2018-11-24 LAB — BASIC METABOLIC PANEL
Anion gap: 9 (ref 5–15)
BUN: 21 mg/dL (ref 8–23)
CO2: 25 mmol/L (ref 22–32)
Calcium: 8.2 mg/dL — ABNORMAL LOW (ref 8.9–10.3)
Chloride: 100 mmol/L (ref 98–111)
Creatinine, Ser: 0.63 mg/dL (ref 0.61–1.24)
GFR calc Af Amer: >60 mL/min (ref >60)
GFR calc non Af Amer: >60 mL/min (ref >60)
Glucose, Bld: 121 mg/dL — ABNORMAL HIGH (ref 70–99)
Potassium: 3.7 mmol/L (ref 3.5–5.1)
Sodium: 134 mmol/L — ABNORMAL LOW (ref 135–145)

## 2018-11-24 LAB — URINALYSIS, ROUTINE W REFLEX MICROSCOPIC
Bilirubin Urine: NEGATIVE
Glucose, UA: NEGATIVE mg/dL
Hgb urine dipstick: NEGATIVE
Ketones, ur: NEGATIVE mg/dL
Leukocytes,Ua: NEGATIVE
Nitrite: NEGATIVE
Protein, ur: NEGATIVE mg/dL
Specific Gravity, Urine: 1.018 (ref 1.005–1.030)
pH: 5 (ref 5.0–8.0)

## 2018-11-24 LAB — CBC WITH DIFFERENTIAL/PLATELET
Abs Immature Granulocytes: 0.02 10*3/uL (ref 0.00–0.07)
Basophils Absolute: 0.1 10*3/uL (ref 0.0–0.1)
Basophils Relative: 1 %
Eosinophils Absolute: 0.1 10*3/uL (ref 0.0–0.5)
Eosinophils Relative: 2 %
HCT: 26.7 % — ABNORMAL LOW (ref 39.0–52.0)
Hemoglobin: 8 g/dL — ABNORMAL LOW (ref 13.0–17.0)
Immature Granulocytes: 0 %
Lymphocytes Relative: 9 %
Lymphs Abs: 0.6 10*3/uL — ABNORMAL LOW (ref 0.7–4.0)
MCH: 27.4 pg (ref 26.0–34.0)
MCHC: 30 g/dL (ref 30.0–36.0)
MCV: 91.4 fL (ref 80.0–100.0)
Monocytes Absolute: 0.8 10*3/uL (ref 0.1–1.0)
Monocytes Relative: 13 %
Neutro Abs: 4.6 10*3/uL (ref 1.7–7.7)
Neutrophils Relative %: 75 %
Platelets: 396 10*3/uL (ref 150–400)
RBC: 2.92 MIL/uL — ABNORMAL LOW (ref 4.22–5.81)
RDW: 16.4 % — ABNORMAL HIGH (ref 11.5–15.5)
WBC: 6.1 10*3/uL (ref 4.0–10.5)
nRBC: 0 % (ref 0.0–0.2)

## 2018-11-24 LAB — COMPREHENSIVE METABOLIC PANEL
ALT: 19 U/L (ref 0–44)
AST: 23 U/L (ref 15–41)
Albumin: 2.2 g/dL — ABNORMAL LOW (ref 3.5–5.0)
Alkaline Phosphatase: 85 U/L (ref 38–126)
Anion gap: 8 (ref 5–15)
BUN: 20 mg/dL (ref 8–23)
CO2: 25 mmol/L (ref 22–32)
Calcium: 8.2 mg/dL — ABNORMAL LOW (ref 8.9–10.3)
Chloride: 102 mmol/L (ref 98–111)
Creatinine, Ser: 0.62 mg/dL (ref 0.61–1.24)
GFR calc Af Amer: >60 mL/min (ref >60)
GFR calc non Af Amer: >60 mL/min (ref >60)
Glucose, Bld: 122 mg/dL — ABNORMAL HIGH (ref 70–99)
Potassium: 3.7 mmol/L (ref 3.5–5.1)
Sodium: 135 mmol/L (ref 135–145)
Total Bilirubin: 0.2 mg/dL — ABNORMAL LOW (ref 0.3–1.2)
Total Protein: 6.3 g/dL — ABNORMAL LOW (ref 6.5–8.1)

## 2018-11-24 LAB — TSH: TSH: 0.904 u[IU]/mL (ref 0.350–4.500)

## 2018-11-24 LAB — PHOSPHORUS: Phosphorus: 3.4 mg/dL (ref 2.5–4.6)

## 2018-11-24 LAB — MAGNESIUM: Magnesium: 1.8 mg/dL (ref 1.7–2.4)

## 2018-11-24 LAB — LACTIC ACID, PLASMA
Lactic Acid, Venous: 1.6 mmol/L (ref 0.5–1.9)
Lactic Acid, Venous: 0.7 mmol/L (ref 0.5–1.9)

## 2018-11-24 LAB — CBG MONITORING, ED: Glucose-Capillary: 115 mg/dL — ABNORMAL HIGH (ref 70–99)

## 2018-11-24 MED ORDER — ALBUTEROL SULFATE (2.5 MG/3ML) 0.083% IN NEBU: 2.5000 mg | INHALATION_SOLUTION | RESPIRATORY_TRACT | Status: DC | PRN

## 2018-11-24 MED ORDER — ADULT MULTIVITAMIN W/MINERALS CH
1.0000 | ORAL_TABLET | Freq: Every day | ORAL | Status: DC
  Administered 2018-11-25 – 2018-11-29 (×5): 1 via ORAL
  Filled 2018-11-24 (×5): qty 1

## 2018-11-24 MED ORDER — PIPERACILLIN-TAZOBACTAM 3.375 G IVPB 30 MIN
3.3750 g | INTRAVENOUS | Status: AC
  Administered 2018-11-24: 3.375 g via INTRAVENOUS
  Filled 2018-11-24: qty 50

## 2018-11-24 MED ORDER — SODIUM CHLORIDE 0.9 % IV BOLUS
1000.0000 mL | Freq: Once | INTRAVENOUS | Status: AC
  Administered 2018-11-24: 1000 mL via INTRAVENOUS

## 2018-11-24 MED ORDER — MUPIROCIN 2 % EX OINT
TOPICAL_OINTMENT | Freq: Three times a day (TID) | CUTANEOUS | Status: DC
  Administered 2018-11-25 – 2018-11-27 (×9): via TOPICAL
  Administered 2018-11-28: 1 via TOPICAL
  Administered 2018-11-28 – 2018-11-29 (×3): via TOPICAL
  Filled 2018-11-24 (×3): qty 22

## 2018-11-24 MED ORDER — SODIUM CHLORIDE 0.9% FLUSH
3.0000 mL | Freq: Two times a day (BID) | INTRAVENOUS | Status: DC
  Administered 2018-11-27: 3 mL via INTRAVENOUS

## 2018-11-24 MED ORDER — SODIUM CHLORIDE 0.9% FLUSH: 3.0000 mL | Freq: Once | INTRAVENOUS | Status: DC

## 2018-11-24 MED ORDER — SODIUM CHLORIDE 0.9 % IV SOLN
Freq: Once | INTRAVENOUS | Status: AC
  Administered 2018-11-24: 17:00:00 via INTRAVENOUS

## 2018-11-24 MED ORDER — NYSTATIN 100000 UNIT/ML MT SUSP
5.0000 mL | Freq: Four times a day (QID) | OROMUCOSAL | Status: DC
  Administered 2018-11-25 – 2018-11-28 (×13): 500000 [IU] via ORAL
  Filled 2018-11-24 (×16): qty 5

## 2018-11-24 MED ORDER — VANCOMYCIN HCL IN DEXTROSE 1-5 GM/200ML-% IV SOLN
1000.0000 mg | INTRAVENOUS | Status: AC
  Administered 2018-11-24: 1000 mg via INTRAVENOUS
  Filled 2018-11-24: qty 200

## 2018-11-24 MED ORDER — ENOXAPARIN SODIUM 40 MG/0.4ML ~~LOC~~ SOLN
40.0000 mg | Freq: Every day | SUBCUTANEOUS | Status: DC
  Administered 2018-11-25 – 2018-11-28 (×4): 40 mg via SUBCUTANEOUS
  Filled 2018-11-24 (×4): qty 0.4

## 2018-11-24 MED ORDER — SODIUM CHLORIDE 0.9 % IV SOLN
INTRAVENOUS | Status: AC
  Administered 2018-11-24: via INTRAVENOUS

## 2018-11-24 NOTE — H&P (Addendum)
History and Physical   TAAJ HURLBUT HYQ:[id number] DOB: 1956-05-14 DOA: 11/24/2018  PCP: Patient, No Pcp Per  Chief Complaint: fatigue  Sources of history include EMR review, discussion with the emergency medicine team, and patient provided history.  HPI: This is a 63 year old man with medical problems including stage III non-small cell lung cancer status post chemotherapy plus radiation now on durvalumab (started 04/2018), as well as ongoing smoking presenting with fatigue.  The patient reports living at home by himself, but his son comes daily to assist with his ADLs particularly with bathing.  Reports he has required ADL assistance for the past few months.  He walks with a walker.  He spends over half his time in bed or at rest due to fatigue.  He reports the fatigue has gotten worse over the past 1 to 2 weeks, he was unable to get out of bed which prompted him to seek medical care.  Associated symptoms include dizziness as well as left wrist pain.  He missed his last 2 treatments of durvalumab due to oversleeping as well as having a fall.  Reports decreased p.o. intake due to lack of appetite, denies nausea or vomiting.  He denies fever, chest pain, shortness of breath, hemoptysis, bleeding per rectum.  Last seen in the outpatient oncology clinic on October 21, 2018, at that time heart rate 952, systolic blood pressure 841.  The patient reports chronic skin and nail changes.  Reports left wrist pain.  Most recent CT scan of the chest on October 23, 2018 revealed new left supraclavicular node as well as left adrenal mass consistent with metastatic disease.  The patient currently reports he thinks that his cancer is doing well and responding to treatment appropriately.  It appears the patient was evaluated on November 19, 2018 for generalized weakness and was given IV fluids in the emergency department with some improvement and was able to be discharged home.  ED Course: Initial vital signs 82/61, heart  rate 94.  The patient was given 2 L normal saline as well as broad-spectrum antimicrobials with Pipracil and tazobactam and vancomycin.  Blood pressure improved to 96/68.  Chest x-ray obtained did not reveal any acute cardiopulmonary disease disease.  Left wrist x-ray revealed possible nondisplaced ulnar styloid process fracture, and osteopenia.  Initial labs remarkable for normal white blood count, hemoglobin 8, MCV within normal limits.  BMP unremarkable.  Lactic acid within normal limits.  EKG without ST segment elevation.  Hepatic function panel with albumin of 2.2.  Blood cultures obtained.  Urinalysis without acute findings.  Hospital medicine consulted for further management.  Review of Systems: A complete ROS was obtained; pertinent positives negatives are denoted in the HPI. Otherwise, all systems are negative.   Past Medical History:  Diagnosis Date  . Mass of right lung   . Tobacco abuse    Social History   Socioeconomic History  . Marital status: Divorced    Spouse name: Not on file  . Number of children: Not on file  . Years of education: Not on file  . Highest education level: Not on file  Occupational History  . Not on file  Social Needs  . Financial resource strain: Very hard  . Food insecurity:    Worry: Often true    Inability: Often true  . Transportation needs:    Medical: Yes    Non-medical: Yes  Tobacco Use  . Smoking status: Current Every Day Smoker    Packs/day: 1.00  . Smokeless  tobacco: Never Used  . Tobacco comment: 10 cigarettes per day 11.5.19 BB LPN  Substance and Sexual Activity  . Alcohol use: Yes    Comment: Former   . Drug use: Not Currently  . Sexual activity: Not Currently  Lifestyle  . Physical activity:    Days per week: Not on file    Minutes per session: Not on file  . Stress: Not on file  Relationships  . Social connections:    Talks on phone: Not on file    Gets together: Not on file    Attends religious service: Not on file     Active member of club or organization: Not on file    Attends meetings of clubs or organizations: Not on file    Relationship status: Not on file  . Intimate partner violence:    Fear of current or ex partner: Not on file    Emotionally abused: Not on file    Physically abused: Not on file    Forced sexual activity: Not on file  Other Topics Concern  . Not on file  Social History Narrative   Currently resides in an apartment alone.    Friend states that he has not bathed since last hospitalization.    Friend states that trash, decaying food, animals, bugs, roaches are all living within the home.       Family History  Problem Relation Age of Onset  . Lung disease Father     Physical Exam: Vitals:   11/24/18 1931 11/24/18 2000 11/24/18 2030 11/24/18 2118  BP: (!) 80/62 (!) 86/61 90/65 (!) 87/70  Pulse: 83 84 85 87  Resp: (!) 22 (!) 21 (!) 22 20  Temp:      TempSrc:      SpO2: 98% 100% 100% 99%  Weight:      Height:       General: Extremely thin and cachectic appearing white man, chronically ill-appearing ENT: Grossly normal hearing, dry mucous membranes, white exudate consistent with thrush Cardiovascular: Heart sounds are distant, S1 and S2 were present, no murmur, no lower extremity edema Respiratory: Breathing room air, no conversational dyspnea.  Fair air movement, breathing room air Abdomen: Soft, non-tender.  Skin: Nails on hands and feet with thickening, some hyperkeratosis of the feet, see pictures below. Musculoskeletal: Grossly reduced muscle mass Psychiatric: Grossly normal mood and affect. Neurologic: Moves all extremities in coordinated fashion.  Answers questions appropriately Lymph: No left supraclavicular adenopathy appreciated            I have personally reviewed the following labs, culture data, and imaging studies.  Assessment/Plan:  #Hypotension Course: Presenting symptoms of fatigue, pre-syncope, generalized weakness, and declining  functional status. SBP on 11/19/2018 was 94. On this initial presentation, SBP of 82. Started on broad spectrum antimicrobial therapy, given 2 L of isotonic fluids with some improvement. Normal lactic acid on admission. A/P: Although there are no focal signs and symptoms of foci of infection, given degree of hypotension, reasonable to continue with antimicrobial coverage until clinical picture becomes more clear. Therefore, will continue piperacillin-tazobactam.  Procalcitonin for additional information.  TTE to evaluate / exclude cardiac pathology.  He is on durvalumab, thus immune checkpoint inhibitor toxicity is a consideration; therefore, checking hypothalamic-pituitary axis with biochemical evaluation (r/o hypophysitis, hypothyroidism). Fluid support with LR at 75 cc q hr x 12 hrs then re-assess.  Orthostatic vital signs ordered if they are feasible to obtain.    #Other problems:  -Stage III NSCLC : poorly  differentiated carcinoma on biopsy on 04/30/2019, most recent CT scan in 09/2018 with improvement in the right hemithorax, but concerning for progression / metastases in the left adrenal and left supraclavicular region. ECOG PS declining.  Missed the past few durvalumab treatments.  Plan: will need to coordinate via discussion with primary oncologist or consult oncology to determine optimal next steps, consider biopsy of metastatic site inpatient. His PS may preclude or limit further systemic therapies.   -Severe protein calorie malnutrition: albumin of 2.2, temporal muscle wasting present on exam, dietary consult to help optimize  -Smoking: reports he has stopped at this hospitalization, he declines nicotine replacement; he denies using an inhaler at home, will provide nebulized bronchodilator PRN  -Anemia, normocytic: will obtain reticulocyte count, iron profile, LDH to investigate further, no overt sources of blood loss evident based on hx  -Left wrist fracture: emergency medicine team  recommended wrist brace which has been applied  -Nail changes: topical antimicrobial application  -Thrush: nystatin  DVT prophylaxis: Subq Lovenox Code Status: DNR, but would like intubation if needed Disposition Plan: Anticipate D/C when medically ready Consults called: none, consider oncology in AM Admission status: admit to hospital medicine service   Cheri Rous, MD [facility] Page:[phone]  If 7PM-7AM, please contact night-coverage [email or web address] Password [email or web address]  This document was created using the aid of voice recognition / dication software, please excuse any sound alike substitutions, typographic, or transcription errors. Efforts have been made to correct these errors, however some may persist, and this does not reflect the standard of medical care. If there are any questions please do not hesitate to contact me for clarification.

## 2018-11-24 NOTE — ED Notes (Signed)
Bed: WA07 Expected date:  Expected time:  Means of arrival:  Comments: EMS 

## 2018-11-24 NOTE — ED Triage Notes (Addendum)
EMS reports general weakness for weeks, left wrist pain due to fall last night, History of Lung cancer, missed last several treatments. 86/58-98-18-100% RA IV 18 R FA.Pt alert, states weakness and low bp is not new, bp usually around 90, mainly come in due to wrist pain.

## 2018-11-24 NOTE — ED Notes (Signed)
ED TO INPATIENT HANDOFF REPORT  ED Nurse Name and Phone #:  Willey Blade Name/Age/Gender Sloan Leiter 63 y.o. male Room/Bed: WA07/WA07  Code Status   Code Status: Partial Code  Home/SNF/Other Home Patient oriented to: A&O x4 Is this baseline? Yes   Triage Complete: Triage complete  Chief Complaint Weakness/Wrist Pain (Right Side)  Triage Note EMS reports general weakness for weeks, left wrist pain due to fall last night, History of Lung cancer, missed last several treatments. 86/58-98-18-100% RA IV 18 R FA.Pt alert, states weakness and low bp is not new, bp usually around 90, mainly come in due to wrist pain.   Allergies No Known Allergies  Level of Care/Admitting Diagnosis ED Disposition    ED Disposition Condition Comment   Admit  Hospital Area: [facility] [[id number]]  Level of Care: Med-Surg [16]  Covid Evaluation: N/A  Diagnosis: Hypotension [314970]  Admitting Physician: Vilma Prader [[id number]]  Attending Physician: Vilma Prader [[id number]]  PT Class (Do Not Modify): Observation [104]  PT Acc Code (Do Not Modify): Observation [10022]       B Medical/Surgery History Past Medical History:  Diagnosis Date  . Mass of right lung   . Tobacco abuse    Past Surgical History:  Procedure Laterality Date  . BIOPSY  04/29/2018   Procedure: BIOPSY;  Surgeon: Garner Nash, DO;  Location: [facility];  Service: Cardiopulmonary;;  . BRONCHIAL WASHINGS  04/29/2018   Procedure: BRONCHIAL WASHINGS;  Surgeon: Garner Nash, DO;  Location: [facility];  Service: Cardiopulmonary;;  . ENDOBRONCHIAL ULTRASOUND Bilateral 04/29/2018   Procedure: ENDOBRONCHIAL ULTRASOUND;  Surgeon: Garner Nash, DO;  Location: [facility];  Service: Cardiopulmonary;  Laterality: Bilateral;  . Endobronchial Ultrasound (EBUS), Mediastinum  Bilateral 04/29/2018   Dr. Valeta Harms   . FINE NEEDLE ASPIRATION BIOPSY  04/29/2018   Procedure: FINE NEEDLE ASPIRATION  BIOPSY;  Surgeon: Garner Nash, DO;  Location: [facility];  Service: Cardiopulmonary;;  . FLEXIBLE BRONCHOSCOPY  04/29/2018   Procedure: FLEXIBLE BRONCHOSCOPY;  Surgeon: Garner Nash, DO;  Location: [facility];  Service: Cardiopulmonary;;  . TONSILLECTOMY       A IV Location/Drains/Wounds Patient Lines/Drains/Airways Status   Active Line/Drains/Airways    Name:   Placement date:   Placement time:   Site:   Days:   Peripheral IV 11/24/18 Right Forearm   11/24/18    1644    Forearm   less than 1          Intake/Output Last 24 hours No intake or output data in the 24 hours ending 11/24/18 2225  Labs/Imaging Results for orders placed or performed during the hospital encounter of 11/24/18 (from the past 48 hour(s))  Lactic acid, plasma     Status: None   Collection Time: 11/24/18  5:23 PM  Result Value Ref Range   Lactic Acid, Venous 1.6 0.5 - 1.9 mmol/L    Comment: Performed at [facility], [place] [address] [place], [place] [address]  CBC with Differential     Status: Abnormal   Collection Time: 11/24/18  5:23 PM  Result Value Ref Range   WBC 6.1 4.0 - 10.5 K/uL   RBC 2.92 (L) 4.22 - 5.81 MIL/uL   Hemoglobin 8.0 (L) 13.0 - 17.0 g/dL   HCT 26.7 (L) 39.0 - 52.0 %   MCV 91.4 80.0 - 100.0 fL   MCH 27.4 26.0 - 34.0 pg   MCHC 30.0 30.0 - 36.0 g/dL  RDW 16.4 (H) 11.5 - 15.5 %   Platelets 396 150 - 400 K/uL   nRBC 0.0 0.0 - 0.2 %   Neutrophils Relative % 75 %   Neutro Abs 4.6 1.7 - 7.7 K/uL   Lymphocytes Relative 9 %   Lymphs Abs 0.6 (L) 0.7 - 4.0 K/uL   Monocytes Relative 13 %   Monocytes Absolute 0.8 0.1 - 1.0 K/uL   Eosinophils Relative 2 %   Eosinophils Absolute 0.1 0.0 - 0.5 K/uL   Basophils Relative 1 %   Basophils Absolute 0.1 0.0 - 0.1 K/uL   Immature Granulocytes 0 %   Abs Immature Granulocytes 0.02 0.00 - 0.07 K/uL    Comment: Performed at [facility], [place] [address], [place], [place] [address]  Basic metabolic  panel     Status: Abnormal   Collection Time: 11/24/18  5:25 PM  Result Value Ref Range   Sodium 134 (L) 135 - 145 mmol/L   Potassium 3.7 3.5 - 5.1 mmol/L   Chloride 100 98 - 111 mmol/L   CO2 25 22 - 32 mmol/L   Glucose, Bld 121 (H) 70 - 99 mg/dL   BUN 21 8 - 23 mg/dL   Creatinine, Ser 0.63 0.61 - 1.24 mg/dL   Calcium 8.2 (L) 8.9 - 10.3 mg/dL   GFR calc non Af Amer >60 >60 mL/min   GFR calc Af Amer >60 >60 mL/min   Anion gap 9 5 - 15    Comment: Performed at [facility], [place] [address] [place], [place] [address]  Comprehensive metabolic panel     Status: Abnormal   Collection Time: 11/24/18  5:25 PM  Result Value Ref Range   Sodium 135 135 - 145 mmol/L   Potassium 3.7 3.5 - 5.1 mmol/L   Chloride 102 98 - 111 mmol/L   CO2 25 22 - 32 mmol/L   Glucose, Bld 122 (H) 70 - 99 mg/dL   BUN 20 8 - 23 mg/dL   Creatinine, Ser 0.62 0.61 - 1.24 mg/dL   Calcium 8.2 (L) 8.9 - 10.3 mg/dL   Total Protein 6.3 (L) 6.5 - 8.1 g/dL   Albumin 2.2 (L) 3.5 - 5.0 g/dL   AST 23 15 - 41 U/L   ALT 19 0 - 44 U/L   Alkaline Phosphatase 85 38 - 126 U/L   Total Bilirubin 0.2 (L) 0.3 - 1.2 mg/dL   GFR calc non Af Amer >60 >60 mL/min   GFR calc Af Amer >60 >60 mL/min   Anion gap 8 5 - 15    Comment: Performed at [facility], [place] [address] [place], [place] [address]  TSH     Status: None   Collection Time: 11/24/18  5:25 PM  Result Value Ref Range   TSH 0.904 0.350 - 4.500 uIU/mL    Comment: Performed by a 3rd Generation assay with a functional sensitivity of <=0.01 uIU/mL. Performed at [facility], [place] [address] [place], [place] [address]   CBG monitoring, ED     Status: Abnormal   Collection Time: 11/24/18  5:34 PM  Result Value Ref Range   Glucose-Capillary 115 (H) 70 - 99 mg/dL  Magnesium     Status: None   Collection Time: 11/24/18  7:25 PM  Result Value Ref Range   Magnesium 1.8 1.7 - 2.4 mg/dL    Comment: Performed at [facility], [place] [address] [place], [place] [address]  Phosphorus  Status: None   Collection Time: 11/24/18  7:25 PM  Result Value Ref Range   Phosphorus 3.4 2.5 - 4.6 mg/dL    Comment: Performed at [facility], [place] [address], [place], [place] [address]  Urinalysis, Routine w reflex microscopic     Status: Abnormal   Collection Time: 11/24/18  7:52 PM  Result Value Ref Range   Color, Urine YELLOW YELLOW   APPearance HAZY (A) CLEAR   Specific Gravity, Urine 1.018 1.005 - 1.030   pH 5.0 5.0 - 8.0   Glucose, UA NEGATIVE NEGATIVE mg/dL   Hgb urine dipstick NEGATIVE NEGATIVE   Bilirubin Urine NEGATIVE NEGATIVE   Ketones, ur NEGATIVE NEGATIVE mg/dL   Protein, ur NEGATIVE NEGATIVE mg/dL   Nitrite NEGATIVE NEGATIVE   Leukocytes,Ua NEGATIVE NEGATIVE    Comment: Performed at [place] [address] [place], [place] [address]  Lactic acid, plasma     Status: None   Collection Time: 11/24/18  8:51 PM  Result Value Ref Range   Lactic Acid, Venous 0.7 0.5 - 1.9 mmol/L    Comment: Performed at [facility], [place] [address], [place],  [address]   Dg Chest 2 View  Result Date: 11/24/2018 CLINICAL DATA:  Generalized weakness EXAM: CHEST - 2 VIEW COMPARISON:  11/19/2018, CT 10/23/2018, PET-CT 05/10/2018 FINDINGS: Hyperinflated lungs with emphysematous disease. Decreased right hilar mass compared to prior radiograph. No acute consolidation or effusion. Normal heart size. Aortic atherosclerosis. No pneumothorax. IMPRESSION: No active cardiopulmonary disease. Hyperinflation and emphysematous disease. Decreased right hilar mass as compared with prior radiograph Electronically Signed   By: Donavan Foil M.D.   On: 11/24/2018 18:26   Dg Wrist Complete Left  Result Date: 11/24/2018 CLINICAL DATA:  Fall with generalized wrist pain EXAM: LEFT WRIST - COMPLETE 3+ VIEW COMPARISON:  None. FINDINGS: Bones appear osteopenic.   Advanced visi deformity on lateral view. IMPRESSION: 1. Possible subtle nondisplaced ulnar styloid process fracture 2. Widened scapholunate interval consistent with ligamentous abnormality. Possible visi deformity though with nonstandard positioning on lateral view. 3. Osteopenia.  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   On: 11/24/2018 17:44    Pending Labs Unresulted Labs (From admission, onward)    Start     Ordered   11/25/18 0500  Comprehensive metabolic panel  Tomorrow morning,   R     11/24/18 2144   11/25/18 0500  CBC  Tomorrow morning,   R     11/24/18 2144   11/25/18 0500  Lactate dehydrogenase  Tomorrow morning,   R     11/24/18 2144   11/25/18 0500  Reticulocytes  Tomorrow morning,   R     11/24/18 2144   11/25/18 0500  Iron and TIBC  Tomorrow morning,   R     11/24/18 2144   11/25/18 0500  Ferritin  Tomorrow morning,   R     11/24/18 2144   11/25/18 0500  Procalcitonin  Daily,   R     11/24/18 2144   11/25/18 0500  Cortisol  Tomorrow morning,   R     11/24/18 2144   11/25/18 0500  ACTH  Tomorrow morning,   R     11/24/18 2144   11/24/18 2145  Procalcitonin - Baseline  ONCE - STAT,   STAT     11/24/18 2144   09/38/18 2993  Follicle stimulating hormone  Add-on,   R  11/24/18 2144   11/24/18 2145  Luteinizing hormone  Add-on,   R     11/24/18 2144   11/24/18 2145  TSH  Add-on,   R     11/24/18 2144   11/24/18 2145  T4, free  Add-on,   R     11/24/18 2144   11/24/18 2145  ACTH  Add-on,   R     11/24/18 2144   11/24/18 2145  Testosterone  Add-on,   R     11/24/18 2144   11/24/18 2102  Cortisol  Once,   R     11/24/18 2101   11/24/18 2101  T4, free  ONCE - STAT,   STAT     11/24/18 2100   11/24/18 1748  Urine culture  ONCE - STAT,   STAT     11/24/18 1748   11/24/18 1723  Culture, blood (Routine x 2)  BLOOD CULTURE X 2,   STAT     11/24/18 1723          Vitals/Pain Today's Vitals   11/24/18 2030 11/24/18  2118 11/24/18 2130 11/24/18 2200  BP: 90/65 (!) _0  Pulse: 85 87 88 86  Resp: (!) _1 Temp:      TempSrc:      SpO2: 100% 99% 100% 100%  Weight:      Height:      PainSc:        Isolation Precautions No active isolations  Medications Medications  sodium chloride flush (NS) 0.9 % injection 3 mL (3 mLs Intravenous Not Given 11/24/18 1812)  albuterol (PROVENTIL) (2.5 MG/3ML) 0.083% nebulizer solution 2.5 mg (has no administration in time range)  mupirocin ointment (BACTROBAN) 2 % (has no administration in time range)  multivitamin with minerals tablet 1 tablet (has no administration in time range)  enoxaparin (LOVENOX) injection 40 mg (has no administration in time range)  sodium chloride flush (NS) 0.9 % injection 3 mL (has no administration in time range)  0.9 %  sodium chloride infusion (has no administration in time range)  nystatin (MYCOSTATIN) 100000 UNIT/ML suspension 500,000 Units (has no administration in time range)  0.9 %  sodium chloride infusion ( Intravenous Stopped 11/24/18 1933)  sodium chloride 0.9 % bolus 1,000 mL (0 mLs Intravenous Stopped 11/24/18 1933)  vancomycin (VANCOCIN) IVPB 1000 mg/200 mL premix (0 mg Intravenous Stopped 11/24/18 1930)  piperacillin-tazobactam (ZOSYN) IVPB 3.375 g (0 g Intravenous Stopped 11/24/18 1933)  sodium chloride 0.9 % bolus 1,000 mL (0 mLs Intravenous Stopped 11/24/18 2051)    Mobility walks Moderate fall risk   Focused Assessments    R Recommendations: See Admitting Provider Note  Report given to:   Additional Notes:

## 2018-11-24 NOTE — ED Notes (Signed)
Urinal placed at bedside.

## 2018-11-24 NOTE — Progress Notes (Signed)
Pt refused to do standing part of orthostatic pressures. He stated that his son usually gets him up at home and he doesn't stand on his own. He did not want RN and NT to assist him to standing position.

## 2018-11-24 NOTE — ED Provider Notes (Signed)
[facility] DEPT Provider Note   CSN: [id number] Arrival date & time: 11/24/18  1627    History   Chief Complaint Chief Complaint  Patient presents with  . Weakness  . Wrist Pain    left    HPI Warren Blankenship is a 63 y.o. male.     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  Reports he was seen April 21 for similar symptoms of generalized weakness, however the symptoms have been progressive since that time.  Reports he previously was able to get around using a walker, however progressively over the last week, he is developed inability to ambulate due to generalized weakness.  Denies any focal weakness or numbness.  Reports severe fatigue.  Reports due to this fatigue he has had falls, most recently 2 days ago. Reports he fell hurting his left wrist and came in due to concerns of worsening general weakness and left wrist pain.  Denies fevers, cough, chest pain, shortness of breath, abdominal pain, nausea, vomiting, diarrhea, black or bloody stools, focal numbness or weakness, headaches, known sick contacts.  Reports his blood pressures are usually in the 90s, however chart review shows typically 761 systolic.  Reports he has had low appetite over the last several weeks, and has had poor p.o. intake.  Only concern on review of system is the left wrist pain, and pain of lesions of his distal extremities.  Reports that initially he had abnormal nails, and very mild redness around the tips of his nails, and had been given an ointment that per chart review his Bactroban by oncology, however over the last week he has had worsening of these lesions at the tips of his fingers and toes, as well as lesions on the bottom of his feet and over the last 2 weeks has developed spots on his palms that are  also tender.    Past Medical History:  Diagnosis Date  . Mass of right lung   . Tobacco abuse     Patient Active Problem List   Diagnosis Date Noted  . Dyspnea on exertion 10/14/2018  . Encounter for antineoplastic immunotherapy 07/17/2018  . Goals of care, counseling/discussion 05/14/2018  . Encounter for antineoplastic chemotherapy 05/14/2018  . Stage III squamous cell carcinoma of right lung ([place]) 05/10/2018  . Anemia of chronic disease 05/01/2018  . Hypotension 04/29/2018  . Severe protein-calorie malnutrition [name]: less than 60% of standard weight) ([place]) 04/29/2018  . Hyponatremia 04/29/2018  . Hypokalemia 04/29/2018  . Mediastinal adenopathy   . Hilar adenopathy   . Malignant neoplasm of middle lobe, bronchus or lung ([place]) 04/12/2018  . Transient hypotension 04/12/2018  . Normocytic normochromic anemia 04/12/2018  . Thrombocytosis ([place]) 04/12/2018  . Tobacco use disorder 04/12/2018  . ARF (acute renal failure) ([place]) 03/19/2018  . FTT (failure to thrive) in adult 03/19/2018  . Hyperkalemia 03/19/2018  . Cachexia ([place]) 03/19/2018  . Lactic acidosis 03/19/2018  . Palpitation 03/19/2018    Past Surgical History:  Procedure Laterality Date  . BIOPSY  04/29/2018   Procedure: BIOPSY;  Surgeon: Garner Nash, DO;  Location: [facility];  Service: Cardiopulmonary;;  . BRONCHIAL WASHINGS  04/29/2018   Procedure: BRONCHIAL WASHINGS;  Surgeon: Garner Nash, DO;  Location: [facility];  Service: Cardiopulmonary;;  . ENDOBRONCHIAL ULTRASOUND Bilateral 04/29/2018   Procedure: ENDOBRONCHIAL  ULTRASOUND;  Surgeon: Garner Nash, DO;  Location: [facility];  Service: Cardiopulmonary;  Laterality: Bilateral;  . Endobronchial Ultrasound (EBUS), Mediastinum  Bilateral 04/29/2018   Dr. Valeta Harms   . FINE NEEDLE ASPIRATION BIOPSY  04/29/2018   Procedure: FINE NEEDLE ASPIRATION BIOPSY;  Surgeon: Garner Nash, DO;  Location: [facility];  Service: Cardiopulmonary;;  .  FLEXIBLE BRONCHOSCOPY  04/29/2018   Procedure: FLEXIBLE BRONCHOSCOPY;  Surgeon: Garner Nash, DO;  Location: [facility];  Service: Cardiopulmonary;;  . TONSILLECTOMY          Home Medications    Prior to Admission medications   Medication Sig Start Date End Date Taking? Authorizing Provider  acetaminophen (TYLENOL) 325 MG tablet Take 650 mg by mouth daily.   Yes [provider]  Multiple Vitamin (MULTIVITAMIN WITH MINERALS) TABS tablet Take 1 tablet by mouth daily. 04/14/18  Yes Georgette Shell, MD  acetaminophen (TYLENOL) 325 MG tablet Take 2 tablets (650 mg total) by mouth every 6 (six) hours as needed for mild pain (or Fever >/= 101). Patient not taking: Reported on 11/24/2018 05/02/18   Debbe Odea, MD  albuterol (PROVENTIL) (2.5 MG/3ML) 0.083% nebulizer solution Take 3 mLs (2.5 mg total) by nebulization every 4 (four) hours as needed for wheezing or shortness of breath. Patient not taking: Reported on 11/19/2018 06/25/18   June Leap L, DO  chlorhexidine (HIBICLENS) 4 % external liquid Apply topically daily as needed. Mix with warm water in 50/50 ratio and soak fingernails and toenails twice daily Patient taking differently: Apply 1 application topically daily as needed (toes/daily). Mix with warm water in 50/50 ratio and soak fingernails and toenails twice daily 10/07/18   Tanner, Lyndon Code., PA-C  lactose free nutrition (BOOST PLUS) LIQD Take 237 mLs by mouth 3 (three) times daily between meals. Patient not taking: Reported on 11/24/2018 05/03/18   Debbe Odea, MD  mupirocin ointment (BACTROBAN) 2 % Apply to the area around the toe and finger nails three times daily Patient not taking: Reported on 11/24/2018 10/07/18   Sandi Mealy E., PA-C  polyethylene glycol (MIRALAX / GLYCOLAX) packet Take 17 g by mouth daily as needed for mild constipation. Patient not taking: Reported on 11/19/2018 05/03/18   Debbe Odea, MD  prochlorperazine (COMPAZINE) 10 MG tablet TAKE 1 TABLET BY  MOUTH EVERY 6 HOURS AS NEEDED FOR NAUSEA OR VOMITING Patient not taking: No sig reported 10/14/18   Curt Bears, MD  umeclidinium-vilanterol [facility] ELLIPTA) 62.5-25 MCG/INH AEPB Inhale 1 puff into the lungs daily. Patient not taking: Reported on 10/14/2018 06/04/18   Garner Nash, DO    Family History Family History  Problem Relation Age of Onset  . Lung disease Father     Social History Social History   Tobacco Use  . Smoking status: Current Every Day Smoker    Packs/day: 1.00  . Smokeless tobacco: Never Used  . Tobacco comment: 10 cigarettes per day 11.5.19 BB LPN  Substance Use Topics  . Alcohol use: Yes    Comment: Former   . Drug use: Not Currently     Allergies   Patient has no known allergies.   Review of Systems Review of Systems   Physical Exam Updated Vital Signs BP 90/65   Pulse 85   Temp 98.6 F (37 C) (Oral)   Resp (!) 22   Ht _0  (1.702 m)   Wt 46.3 kg   SpO2 100%   BMI 15.98 kg/m   Physical Exam Vitals signs and  nursing note reviewed.  Constitutional:      General: He is not in acute distress.    Appearance: He is well-developed. He is cachectic. He is ill-appearing (chronically ill appearing). He is not diaphoretic.  HENT:     Head: Normocephalic and atraumatic.  Eyes:     Conjunctiva/sclera: Conjunctivae normal.  Neck:     Musculoskeletal: Normal range of motion.  Cardiovascular:     Rate and Rhythm: Normal rate and regular rhythm.     Heart sounds: Normal heart sounds. No murmur. No friction rub. No gallop.   Pulmonary:     Effort: Pulmonary effort is normal. No respiratory distress.     Breath sounds: Normal breath sounds. No wheezing or rales.  Abdominal:     General: There is no distension.     Palpations: Abdomen is soft.     Tenderness: There is no abdominal tenderness. There is no guarding.  Musculoskeletal:        General: Tenderness (left wrist, normal movement and sensation) present.  Skin:    General: Skin is  warm and dry.     Comments: Skin findings as seen in photos below  Neurological:     Mental Status: He is alert and oriented to person, place, and time.                  ED Treatments / Results  Labs (all labs ordered are listed, but only abnormal results are displayed) Labs Reviewed  BASIC METABOLIC PANEL - Abnormal; Notable for the following components:      Result Value   Sodium 134 (*)    Glucose, Bld 121 (*)    Calcium 8.2 (*)    All other components within normal limits  URINALYSIS, ROUTINE W REFLEX MICROSCOPIC - Abnormal; Notable for the following components:   APPearance HAZY (*)    All other components within normal limits  CBC WITH DIFFERENTIAL/PLATELET - Abnormal; Notable for the following components:   RBC 2.92 (*)    Hemoglobin 8.0 (*)    HCT 26.7 (*)    RDW 16.4 (*)    Lymphs Abs 0.6 (*)    All other components within normal limits  COMPREHENSIVE METABOLIC PANEL - Abnormal; Notable for the following components:   Glucose, Bld 122 (*)    Calcium 8.2 (*)    Total Protein 6.3 (*)    Albumin 2.2 (*)    Total Bilirubin 0.2 (*)    All other components within normal limits  CBG MONITORING, ED - Abnormal; Notable for the following components:   Glucose-Capillary 115 (*)    All other components within normal limits  CULTURE, BLOOD (ROUTINE X 2)  CULTURE, BLOOD (ROUTINE X 2)  URINE CULTURE  LACTIC ACID, PLASMA  LACTIC ACID, PLASMA  MAGNESIUM  PHOSPHORUS  TSH  T4, FREE  CORTISOL    EKG EKG Interpretation  Date/Time:  Sunday November 24 2018 17:38:03 EDT Ventricular Rate:  86 PR Interval:    QRS Duration: 95 QT Interval:  392 QTC Calculation: 469 R Axis:   93 Text Interpretation:  Sinus rhythm Consider left atrial enlargement Probable lateral infarct, old Baseline wander in lead(s) V4 No significant change since last tracing Confirmed by Gareth Morgan [phone]) on 11/24/2018 8:38:33 PM   Radiology Dg Chest 2 View  Result Date: 11/24/2018  CLINICAL DATA:  Generalized weakness EXAM: CHEST - 2 VIEW COMPARISON:  11/19/2018, CT 10/23/2018, PET-CT 05/10/2018 FINDINGS: Hyperinflated lungs with emphysematous disease. Decreased right hilar mass compared to  prior radiograph. No acute consolidation or effusion. Normal heart size. Aortic atherosclerosis. No pneumothorax. IMPRESSION: No active cardiopulmonary disease. Hyperinflation and emphysematous disease. Decreased right hilar mass as compared with prior radiograph Electronically Signed   By: Donavan Foil M.D.   On: 11/24/2018 18:26   Dg Wrist Complete Left  Result Date: 11/24/2018 CLINICAL DATA:  Fall with generalized wrist pain EXAM: LEFT WRIST - COMPLETE 3+ VIEW COMPARISON:  None. FINDINGS: Bones appear osteopenic.  Advanced visi deformity on lateral view. IMPRESSION: 1. Possible subtle nondisplaced ulnar styloid process fracture 2. Widened scapholunate interval consistent with ligamentous abnormality. Possible visi deformity though with nonstandard positioning on lateral view. 3. Osteopenia.  Advanced arthritis at the first [facility] joint Electronically Signed   By: Donavan Foil M.D.   On: 11/24/2018 17:44    Procedures .Critical Care Performed by: Gareth Morgan, MD Authorized by: Gareth Morgan, MD   Critical care provider statement:    Critical care time (minutes):  45   Critical care was necessary to treat or prevent imminent or life-threatening deterioration of the following conditions:  Dehydration   (including critical care time)  Medications Ordered in ED Medications  sodium chloride flush (NS) 0.9 % injection 3 mL (3 mLs Intravenous Not Given 11/24/18 1812)  0.9 %  sodium chloride infusion ( Intravenous Stopped 11/24/18 1933)  sodium chloride 0.9 % bolus 1,000 mL (0 mLs Intravenous Stopped 11/24/18 1933)  vancomycin (VANCOCIN) IVPB 1000 mg/200 mL premix (0 mg Intravenous Stopped 11/24/18 1930)  piperacillin-tazobactam (ZOSYN) IVPB 3.375 g (0 g Intravenous Stopped 11/24/18  1933)  sodium chloride 0.9 % bolus 1,000 mL (0 mLs Intravenous Stopped 11/24/18 2051)     Initial Impression / Assessment and Plan / ED Course  I have reviewed the triage vital signs and the nursing notes.  Pertinent labs & imaging results that were available during my care of the patient were reviewed by me and considered in my medical decision making (see chart for details).        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  Blood pressures are 80s over 50s on arrival to the emergency department.  He reports he is typically in 90s, chart review shows 809X systolic with exception of most recent ED visit also for generalized weakness.   DDx of generalized weakness and low blood pressures includes dehydration, sepsis.  He denies black or bloody stools and have low suspicion for acute GI bleed, although hgb is lower than previous.  No chest pain or dyspnea to suggest PE or ACS.  Doubt tamponade given no cardiomegaly, no signs on EKG, no tachycardia.  Doubt traumatic intraabdominal or retroperitoneal bleed given no pain in these locations.  No sign of bacterial infection on UA or CXR.  He has skin findings which appear to be more chronic, but concerning for possible small vessel vasculitis, although concern for findings on palm may be osler's nodes, and given hypotension and these findings despite no other signs of infection, ordered blood cx and empiric abx.  Consider other adrenal or hypothyroid cause of hypotension.  No significant improvement with initial IV fluids although by history dehydration continues to be possibility.   Will admit for generalized weakness, falls, hypotension.   XR of wrist shows possible ulnar styloid and schapholunate dissociation--discussed with Dr. Lorin Mercy of orhthopedic surgery who recommends removable  wrist splint (thumb spica) and  outpatient follow up in a few days.            Final Clinical Impressions(s) / ED Diagnoses   Final diagnoses:  Hypotension, unspecified hypotension type  Generalized weakness  Fall, initial encounter  Rash    ED Discharge Orders    None       Gareth Morgan, MD 11/24/18 2104

## 2018-11-24 NOTE — Progress Notes (Signed)
A consult was received from an ED physician for Vancomycin and Zosyn per pharmacy dosing.  The patient's profile has been reviewed for ht/wt/allergies/indication/available labs.   A one time order has been placed for Vancomycin 1gm and Zosyn 3.375gm IV .    Further antibiotics/pharmacy consults should be ordered by admitting physician if indicated.                       Thank you, Everette Rank, PharmD 11/24/2018  5:58 PM

## 2018-11-24 NOTE — ED Notes (Signed)
Pt state not able to give urine at this time

## 2018-11-25 ENCOUNTER — Observation Stay (HOSPITAL_COMMUNITY): Payer: Medicaid Other

## 2018-11-25 ENCOUNTER — Other Ambulatory Visit: Payer: Self-pay

## 2018-11-25 DIAGNOSIS — C770 Secondary and unspecified malignant neoplasm of lymph nodes of head, face and neck: Secondary | ICD-10-CM | POA: Diagnosis present

## 2018-11-25 DIAGNOSIS — I951 Orthostatic hypotension: Secondary | ICD-10-CM

## 2018-11-25 DIAGNOSIS — L859 Epidermal thickening, unspecified: Secondary | ICD-10-CM | POA: Diagnosis present

## 2018-11-25 DIAGNOSIS — D638 Anemia in other chronic diseases classified elsewhere: Secondary | ICD-10-CM | POA: Diagnosis present

## 2018-11-25 DIAGNOSIS — I959 Hypotension, unspecified: Secondary | ICD-10-CM | POA: Diagnosis not present

## 2018-11-25 DIAGNOSIS — S52615A Nondisplaced fracture of left ulna styloid process, initial encounter for closed fracture: Secondary | ICD-10-CM | POA: Diagnosis present

## 2018-11-25 DIAGNOSIS — Z9181 History of falling: Secondary | ICD-10-CM | POA: Diagnosis not present

## 2018-11-25 DIAGNOSIS — Z66 Do not resuscitate: Secondary | ICD-10-CM | POA: Diagnosis present

## 2018-11-25 DIAGNOSIS — Z923 Personal history of irradiation: Secondary | ICD-10-CM | POA: Diagnosis not present

## 2018-11-25 DIAGNOSIS — Z9221 Personal history of antineoplastic chemotherapy: Secondary | ICD-10-CM | POA: Diagnosis not present

## 2018-11-25 DIAGNOSIS — W19XXXA Unspecified fall, initial encounter: Secondary | ICD-10-CM

## 2018-11-25 DIAGNOSIS — F17210 Nicotine dependence, cigarettes, uncomplicated: Secondary | ICD-10-CM | POA: Diagnosis present

## 2018-11-25 DIAGNOSIS — R627 Adult failure to thrive: Secondary | ICD-10-CM

## 2018-11-25 DIAGNOSIS — Z79899 Other long term (current) drug therapy: Secondary | ICD-10-CM | POA: Diagnosis not present

## 2018-11-25 DIAGNOSIS — Z681 Body mass index (BMI) 19 or less, adult: Secondary | ICD-10-CM | POA: Diagnosis not present

## 2018-11-25 DIAGNOSIS — C3491 Malignant neoplasm of unspecified part of right bronchus or lung: Secondary | ICD-10-CM | POA: Diagnosis present

## 2018-11-25 DIAGNOSIS — M25532 Pain in left wrist: Secondary | ICD-10-CM | POA: Diagnosis present

## 2018-11-25 DIAGNOSIS — E43 Unspecified severe protein-calorie malnutrition: Secondary | ICD-10-CM | POA: Diagnosis present

## 2018-11-25 DIAGNOSIS — D630 Anemia in neoplastic disease: Secondary | ICD-10-CM | POA: Diagnosis present

## 2018-11-25 DIAGNOSIS — C7972 Secondary malignant neoplasm of left adrenal gland: Secondary | ICD-10-CM | POA: Diagnosis present

## 2018-11-25 DIAGNOSIS — R531 Weakness: Secondary | ICD-10-CM | POA: Diagnosis not present

## 2018-11-25 DIAGNOSIS — J439 Emphysema, unspecified: Secondary | ICD-10-CM | POA: Diagnosis present

## 2018-11-25 DIAGNOSIS — B370 Candidal stomatitis: Secondary | ICD-10-CM | POA: Diagnosis present

## 2018-11-25 LAB — COMPREHENSIVE METABOLIC PANEL
ALT: 18 U/L (ref 0–44)
AST: 20 U/L (ref 15–41)
Albumin: 2.1 g/dL — ABNORMAL LOW (ref 3.5–5.0)
Alkaline Phosphatase: 82 U/L (ref 38–126)
Anion gap: 9 (ref 5–15)
BUN: 13 mg/dL (ref 8–23)
CO2: 22 mmol/L (ref 22–32)
Calcium: 8.1 mg/dL — ABNORMAL LOW (ref 8.9–10.3)
Chloride: 104 mmol/L (ref 98–111)
Creatinine, Ser: 0.5 mg/dL — ABNORMAL LOW (ref 0.61–1.24)
GFR calc Af Amer: >60 mL/min (ref >60)
GFR calc non Af Amer: >60 mL/min (ref >60)
Glucose, Bld: 79 mg/dL (ref 70–99)
Potassium: 3.8 mmol/L (ref 3.5–5.1)
Sodium: 135 mmol/L (ref 135–145)
Total Bilirubin: 0.6 mg/dL (ref 0.3–1.2)
Total Protein: 5.9 g/dL — ABNORMAL LOW (ref 6.5–8.1)

## 2018-11-25 LAB — IRON AND TIBC
Iron: 17 ug/dL — ABNORMAL LOW (ref 45–182)
Saturation Ratios: 16 % — ABNORMAL LOW (ref 17.9–39.5)
TIBC: 106 ug/dL — ABNORMAL LOW (ref 250–450)
UIBC: 89 ug/dL

## 2018-11-25 LAB — URINE CULTURE: Culture: NO GROWTH

## 2018-11-25 LAB — FERRITIN: Ferritin: 657 ng/mL — ABNORMAL HIGH (ref 24–336)

## 2018-11-25 LAB — CBC
HCT: 26 % — ABNORMAL LOW (ref 39.0–52.0)
Hemoglobin: 7.8 g/dL — ABNORMAL LOW (ref 13.0–17.0)
MCH: 27.3 pg (ref 26.0–34.0)
MCHC: 30 g/dL (ref 30.0–36.0)
MCV: 90.9 fL (ref 80.0–100.0)
Platelets: 363 10*3/uL (ref 150–400)
RBC: 2.86 MIL/uL — ABNORMAL LOW (ref 4.22–5.81)
RDW: 16.3 % — ABNORMAL HIGH (ref 11.5–15.5)
WBC: 7.1 10*3/uL (ref 4.0–10.5)
nRBC: 0 % (ref 0.0–0.2)

## 2018-11-25 LAB — T4, FREE
Free T4: 1.03 ng/dL (ref 0.82–1.77)
Free T4: 0.99 ng/dL (ref 0.82–1.77)

## 2018-11-25 LAB — CORTISOL
Cortisol, Plasma: 12.4 ug/dL
Cortisol, Plasma: 9.4 ug/dL

## 2018-11-25 LAB — RETICULOCYTES
Immature Retic Fract: 21.4 % — ABNORMAL HIGH (ref 2.3–15.9)
RBC.: 2.86 MIL/uL — ABNORMAL LOW (ref 4.22–5.81)
Retic Count, Absolute: 39.2 10*3/uL (ref 19.0–186.0)
Retic Ct Pct: 1.4 % (ref 0.4–3.1)

## 2018-11-25 LAB — TSH: TSH: 1.195 u[IU]/mL (ref 0.350–4.500)

## 2018-11-25 LAB — LACTATE DEHYDROGENASE: LDH: 68 U/L — ABNORMAL LOW (ref 98–192)

## 2018-11-25 LAB — PROCALCITONIN: Procalcitonin: <0.1 ng/mL

## 2018-11-25 MED ORDER — PIPERACILLIN-TAZOBACTAM 3.375 G IVPB
3.3750 g | Freq: Three times a day (TID) | INTRAVENOUS | Status: DC
  Administered 2018-11-25 – 2018-11-28 (×11): 3.375 g via INTRAVENOUS
  Filled 2018-11-25 (×11): qty 50

## 2018-11-25 MED ORDER — ENSURE ENLIVE PO LIQD
237.0000 mL | Freq: Two times a day (BID) | ORAL | Status: DC
  Administered 2018-11-25 – 2018-11-29 (×6): 237 mL via ORAL

## 2018-11-25 MED ORDER — BOOST PLUS PO LIQD
237.0000 mL | Freq: Three times a day (TID) | ORAL | Status: DC
  Filled 2018-11-25: qty 237

## 2018-11-25 NOTE — Progress Notes (Signed)
HEMATOLOGY-ONCOLOGY PROGRESS NOTE  SUBJECTIVE: Warren Blankenship is a 63 year old male with stage IIIB non small cell lung cancer, poorly differentiated carcinoma favoring adenocarcinoma. He has been receiving consolidation treatment with Imfinzi every 2 weeks. He has completed 7 cycles of his treatment. Last cycle was given on 10/21/2018. He has a restaging CT scan of the chest following this appointment, but has cancelled or no showed for follow up visits to review the CT scan results. He has been admitted to the hospital for fatigue and weakness. He was hypotensive in the ER. He denies fevers and chills, Denies chest pain, cough shortness of breath, and hemoptysis. Denies nausea, vomiting, constipation, diarrhea. He has no other complaints.      Stage III squamous cell carcinoma of right lung ([place])   05/10/2018 Initial Diagnosis    Stage III squamous cell carcinoma of right lung ([place])    05/14/2018 - 06/23/2018 Chemotherapy    The patient had palonosetron (ALOXI) injection 0.25 mg, 0.25 mg, Intravenous,  Once, 6 of 6 cycles Administration: 0.25 mg (05/14/2018), 0.25 mg (06/11/2018), 0.25 mg (05/20/2018), 0.25 mg (06/17/2018), 0.25 mg (05/27/2018), 0.25 mg (06/03/2018) CARBOplatin (PARAPLATIN) 170 mg in sodium chloride 0.9 % 250 mL chemo infusion, 170 mg (100 % of original dose 168.8 mg), Intravenous,  Once, 6 of 6 cycles Dose modification: 168.8 mg (original dose 168.8 mg, Cycle 1) Administration: 170 mg (05/14/2018), 170 mg (06/11/2018), 170 mg (05/20/2018), 170 mg (06/17/2018), 170 mg (05/27/2018), 170 mg (06/03/2018) PACLitaxel (TAXOL) 66 mg in sodium chloride 0.9 % 150 mL chemo infusion (</= 80mg /m2), 45 mg/m2 = 66 mg, Intravenous,  Once, 6 of 6 cycles Administration: 66 mg (06/11/2018), 66 mg (05/20/2018), 66 mg (06/17/2018), 66 mg (05/27/2018), 66 mg (06/03/2018), 66 mg (05/14/2018)  for chemotherapy treatment.     07/29/2018 -  Chemotherapy    The patient had durvalumab (IMFINZI) 440 mg in  sodium chloride 0.9 % 100 mL chemo infusion, 10 mg/kg = 440 mg, Intravenous,  Once, 7 of 15 cycles Administration: 440 mg (07/29/2018), 480 mg (08/12/2018), 480 mg (08/26/2018), 480 mg (09/09/2018), 500 mg (09/23/2018), 480 mg (10/21/2018), 480 mg (10/07/2018)  for chemotherapy treatment.      REVIEW OF SYSTEMS:   Constitutional: Denies fevers, chills, weight loss Eyes: Denies blurriness of vision Ears, nose, mouth, throat, and face: Denies mucositis or sore throat Respiratory: Denies cough, dyspnea or wheezes Cardiovascular: Denies palpitation, chest discomfort Gastrointestinal:  Denies nausea, heartburn or change in bowel habits Skin: Denies abnormal skin rashes Lymphatics: Denies new lymphadenopathy or easy bruising Neurological:Denies numbness, tingling or new weaknesses Behavioral/Psych: Mood is stable, no new changes  Extremities: No lower extremity edema All other systems were reviewed with the patient and are negative.  I have reviewed the past medical history, past surgical history, social history and family history with the patient and they are unchanged from previous note.   PHYSICAL EXAMINATION:  Vitals:   11/25/18 0536 11/25/18 0739  BP: (!) 93/55 105/64  Pulse: (!) 101 (!) 102  Resp: 17 18  Temp: 100 F (37.8 C) 99.8 F (37.7 C)  SpO2: 99% 98%   Filed Weights   11/24/18 1639  Weight: 102 lb (46.3 kg)    GENERAL:alert, no distress and comfortable, cachectic.  SKIN: skin color, texture, turgor are normal, no rashes or significant lesions EYES: normal, Conjunctiva are pink and non-injected, sclera clear OROPHARYNX:no exudate, no erythema and lips, buccal mucosa, and tongue normal  NECK: supple, thyroid normal size, non-tender, without nodularity LYMPH:  no palpable lymphadenopathy in the cervical, axillary or inguinal LUNGS: clear to auscultation and percussion with normal breathing effort HEART: regular rate & rhythm and no murmurs and no lower extremity  edema ABDOMEN:abdomen soft, non-tender and normal bowel sounds Musculoskeletal:no cyanosis of digits and no clubbing  NEURO: alert & oriented x 3 with fluent speech, no focal motor/sensory deficits  LABORATORY DATA:  I have reviewed the data as listed CMP Latest Ref Rng & Units 11/25/2018 11/24/2018 11/24/2018  Glucose 70 - 99 mg/dL 79 122(H) 121(H)  BUN 8 - 23 mg/dL 13 20 21   Creatinine 0.61 - 1.24 mg/dL 0.50(L) 0.62 0.63  Sodium 135 - 145 mmol/L 135 135 134(L)  Potassium 3.5 - 5.1 mmol/L 3.8 3.7 3.7  Chloride 98 - 111 mmol/L 104 102 100  CO2 22 - 32 mmol/L 22 25 25   Calcium 8.9 - 10.3 mg/dL 8.1(L) 8.2(L) 8.2(L)  Total Protein 6.5 - 8.1 g/dL 5.9(L) 6.3(L) -  Total Bilirubin 0.3 - 1.2 mg/dL 0.6 0.2(L) -  Alkaline Phos 38 - 126 U/L 82 85 -  AST 15 - 41 U/L 20 23 -  ALT 0 - 44 U/L 18 19 -    Lab Results  Component Value Date   WBC 7.1 11/25/2018   HGB 7.8 (L) 11/25/2018   HCT 26.0 (L) 11/25/2018   MCV 90.9 11/25/2018   PLT 363 11/25/2018   NEUTROABS 4.6 11/24/2018   EXAM: CT CHEST WITH CONTRAST  TECHNIQUE: Multidetector CT imaging of the chest was performed during intravenous contrast administration.  CONTRAST:  73mL OMNIPAQUE IOHEXOL 300 MG/ML  SOLN  COMPARISON:  CT 07/16/2018 and PET-CT 05/10/2018.  FINDINGS: Cardiovascular: Moderate atherosclerosis of the aorta, great vessels and coronary arteries. No acute vascular findings are demonstrated. There is less extrinsic compression of the superior vena cava which remains patent. The heart size is normal. A small pericardial effusion appears unchanged in volume.  Mediastinum/Nodes: There is a new enlarged left supraclavicular node, measuring 15 mm on image 1/2. This is incompletely visualized. Previously demonstrated mediastinal and right hilar adenopathy has significantly improved. There is residual irregular confluent soft tissue in these areas with less mass effect on the tracheobronchial tree and pulmonary  vessels. No discrete residual mediastinal adenopathy. The thyroid gland, trachea and esophagus demonstrate no significant findings.  Lungs/Pleura: There is no pleural effusion or pneumothorax. Mild-to-moderate centrilobular and paraseptal emphysema again noted with mild changes of external beam radiation medially in the right upper lobe. There is no residual measurable right suprahilar mass or right upper lobe bronchial occlusion. 4 mm right upper lobe nodule on image 32/7 is stable. No new or enlarging nodules.  Upper abdomen: There is a new large left adrenal mass measuring up to 4.0 x 2.7 cm on axial image 159/2. This has low-density component superiorly which appear partially necrotic. There is an inferior component with higher density. This measures up to 4.2 cm on coronal image 73/5. The right adrenal gland and visualized liver appear unremarkable.  Musculoskeletal/Chest wall: There is no chest wall mass or suspicious osseous finding. Old rib fractures on the left. Paucity of thoracic fat.  IMPRESSION: 1. Significant interval response to treatment in the right suprahilar mass and mediastinal lymphadenopathy. There is residual confluent soft tissue in the right hilar and paratracheal regions without discrete adenopathy. 2. However, new left supraclavicular node and left adrenal mass consistent with metastatic disease. The left supraclavicular node is incompletely visualized by this examination, but is likely amenable to biopsy under ultrasound if necessary. 3.  Interval improved mass effect on the tracheobronchial tree and central vasculature. 4. Coronary artery, Aortic Atherosclerosis (ICD10-I70.0) and Emphysema (ICD10-J43.9).   Electronically Signed   By: Richardean Sale M.D.   On: 10/23/2018 15:51  ASSESSMENT AND PLAN: This is a very pleasant 63 year old white male recently diagnosed with a stage IIIb non-small cell lung cancer.  The patient completed a course  of concurrent chemoradiation with weekly carboplatin and paclitaxel and tolerated his treatment well with partial response. He is currently undergoing consolidation treatment with immunotherapy with Imfinzi 10 mg/KG every 2 weeks status post 7 cycles.  The patient had a restaging CT scan of the chest performed after his last visit which showed a significant interval response to treatment in the right suprahilar mass and mediastinal lymphadenopathy.  There was residual confluent soft tissue in the right hilar and paratracheal regions without discrete adenopathy.  There was a new left supraclavicular node and left adrenal mass consistent with metastatic disease.  Unfortunately, the patient has canceled or no showed for his appointments to review his CT scan results. I have reviewed the CT scan with Dr. Julien Nordmann who plans to discuss this with the patient at his next follow-up appointment as an outpatient.  No additional work-up is needed at this time.  The patient is anemic with a hemoglobin of 7.8.  Recommend transfusion of packed red blood cells for hemoglobin less than 7.0 or active bleeding.  I will schedule patient for follow-up next week at the cancer center with Dr. Julien Nordmann.  Please contact oncology for any additional questions or concerns while the patient is admitted.  [name], DNP, AGPCNP-BC, AOCNP

## 2018-11-25 NOTE — Plan of Care (Signed)
  Problem: Education: Goal: Knowledge of General Education information will improve Description Including pain rating scale, medication(s)/side effects and non-pharmacologic comfort measures Outcome: Progressing   Problem: Health Behavior/Discharge Planning: Goal: Ability to manage health-related needs will improve Outcome: Progressing   Problem: Clinical Measurements: Goal: Ability to maintain clinical measurements within normal limits will improve Outcome: Progressing Goal: Will remain free from infection Outcome: Progressing Goal: Diagnostic test results will improve Outcome: Progressing Goal: Respiratory complications will improve Outcome: Progressing Goal: Cardiovascular complication will be avoided Outcome: Progressing   Problem: Activity: Goal: Risk for activity intolerance will decrease Outcome: Progressing   Problem: Nutrition: Goal: Adequate nutrition will be maintained Outcome: Progressing   Problem: Coping: Goal: Level of anxiety will decrease Outcome: Progressing   Problem: Elimination: Goal: Will not experience complications related to bowel motility Outcome: Progressing Goal: Will not experience complications related to urinary retention Outcome: Progressing   Problem: Pain Managment: Goal: General experience of comfort will improve Outcome: Progressing   Problem: Safety: Goal: Ability to remain free from injury will improve Outcome: Progressing   Problem: Skin Integrity: Goal: Risk for impaired skin integrity will decrease Outcome: Progressing Plan of care discussed with patient

## 2018-11-25 NOTE — Progress Notes (Signed)
[facility]                                                                              Patient Demographics  Warren Blankenship, is a 63 y.o. male, DOB - 21-Jun-1956, SVX:[id number]  Admit date - 11/24/2018   Admitting Physician Vilma Prader, MD  Outpatient Primary MD for the patient is Patient, No Pcp Per  Outpatient specialists:   LOS - 0  days   Medical records reviewed and are as summarized below:    Chief Complaint  Patient presents with  . Weakness  . Wrist Pain    left       Brief summary   Patient is a 63 year old male with stage III non-small cell lung cancer status post chemotherapy, radiation, now on durvalumab, nicotine abuse presented with failure to thrive and fatigue.  Patient lives at home by himself however son assist with ADLs daily.  Has been having difficulty ambulating and fatigue has gotten worse over the past 1 to 2 weeks and unable to get of the bed which prompted him to seek medical care.  Also reported dizziness, left wrist pain, lack of appetite in the last 6 months.  No nausea or vomiting. Most recent CT scan on 10/23/2018 revealed new left supraclavicular nodes as well as left adrenal mass consistent with metastatic disease In ED, BP 82/61 heart rate 94, patient received 2 L IV fluids, broad-spectrum antibiotics.  BP improved to 96/68.  Chest x-ray showed no pneumonia, left wrist x-ray showed possible nondisplaced ulnar styloid process fracture and osteopenia.   Assessment & Plan    Principal Problem:   Hypotension -In the setting of non-small cell lung CA, failure to thrive, severe protein calorie malnutrition -Patient was given 2 L IV fluid hydration, normal lactic acid, pro calcitonin less than 0.1, no leukocytosis -Sepsis less likely, given degree of hypotension, patient was placed on broad-spectrum antibiotics. 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  Patient has missed a few durvalumab treatments due to failure to thrive  -Discussed with oncology, Dr. Julien Nordmann, they follow recommendations -Patient may also benefit from palliative medicine goals of care, will place consult    Normocytic normochromic anemia: Due to anemia of chronic disease and malignancy -H&H trending down, hemoglobin 7.8, will transfuse if less than 7, likely also has hemodilution effect from IV fluids -Anemia panel showed iron 17, ferritin 657, saturation ratio 16%    Severe protein-calorie malnutrition [name]: less than 60% of standard weight) ([place]) -Dietitian consulted  Nicotine abuse -Counseled on smoking cessation, declines nicotine patch  Left wrist fracture -Left wrist brace  Bilateral nail changes -Hyperkeratotic lesions on  both fingers, patient has ongoing radiation and chemotherapy treatments -Wound care consult  Oral thrush -Continue nystatin  Emphysema -Currently no wheezing, continue inhalers  Code Status: Partial  DVT Prophylaxis:  Lovenox Family Communication: Discussed in detail with the patient, all imaging results, lab results explained to the patient   Disposition Plan:   Time Spent in minutes   35 minutes  Procedures:  None  Consultants:   Oncology discussed with Dr. Julien Nordmann Palliative medicine  Antimicrobials:   Anti-infectives (From admission, onward)   Start     Dose/Rate Route Frequency Ordered Stop   11/25/18 0500   piperacillin-tazobactam (ZOSYN) IVPB 3.375 g     3.375 g 12.5 mL/hr over 240 Minutes Intravenous Every 8 hours 11/25/18 0457     11/24/18 1800  vancomycin (VANCOCIN) IVPB 1000 mg/200 mL premix     1,000 mg 200 mL/hr over 60 Minutes Intravenous STAT 11/24/18 1752 11/24/18 1930   11/24/18 1800  piperacillin-tazobactam (ZOSYN) IVPB 3.375 g     3.375 g 100 mL/hr over 30 Minutes Intravenous STAT 11/24/18 1752 11/24/18 1933         Medications  Scheduled Meds: . enoxaparin (LOVENOX) injection  40 mg Subcutaneous QHS  . feeding supplement (ENSURE ENLIVE)  237 mL Oral BID BM  . multivitamin with minerals  1 tablet Oral Daily  . mupirocin ointment   Topical TID  . nystatin  5 mL Oral QID  . sodium chloride flush  3 mL Intravenous Once  . sodium chloride flush  3 mL Intravenous Q12H   Continuous Infusions: . piperacillin-tazobactam (ZOSYN)  IV 12.5 mL/hr at 11/25/18 0600   PRN Meds:.albuterol      Subjective:   Warren Blankenship was seen and examined today.  No acute complaints.  Reports loss of appetite, food does not taste good.  No fevers or chills, no coughing.  Patient denies chest pain, shortness of breath, abdominal pain, N/V/D/C.  Objective:   Vitals:   11/24/18 2334 11/24/18 2337 11/25/18 0536 11/25/18 0739  BP: 101/67 90/62 (!) 93/55 105/64  Pulse: 94 95 (!) 101 (!) 102  Resp: 16 18 17 18   Temp:   100 F (37.8 C) 99.8 F (37.7 C)  TempSrc:   Oral Oral  SpO2:  100% 99% 98%  Weight:      Height:        Intake/Output Summary (Last 24 hours) at 11/25/2018 1103 Last data filed at 11/25/2018 1000 Gross per 24 hour  Intake 569.16 ml  Output -  Net 569.16 ml     Wt Readings from Last 3 Encounters:  11/24/18 46.3 kg  10/21/18 44.6 kg  10/14/18 43.9 kg     Exam  General: Alert and oriented x 3, NAD appears cachectic, ill-appearing  Eyes:   HEENT:  Atraumatic, normocephalic, normal oropharynx  Cardiovascular: S1 S2 auscultated, Regular rate and rhythm.   Respiratory: Clear to auscultation bilaterally, no wheezing  Gastrointestinal: Soft, nontender, nondistended, + bowel sounds  Ext: no pedal edema bilaterally  Neuro: No new deficits  Musculoskeletal: No digital cyanosis, clubbing  Skin: Hyperkeratotic skin on the bilateral fingers and feet  Psych: Normal affect and demeanor, alert and oriented x3    Data Reviewed:  I have personally reviewed following labs and imaging studies  Micro Results Recent Results (from the past 240 hour(s))  Culture, blood (Routine x 2)     Status: None (Preliminary result)   Collection Time: 11/24/18  5:28 PM  Result Value Ref Range Status   Specimen  Description   Final    BLOOD RIGHT HAND Performed at [facility], [place] [address], [place], [place] [address]    Special Requests   Final    BOTTLES DRAWN AEROBIC ONLY Blood Culture adequate volume Performed at [facility] [address], [place], [place] [address]    Culture   Final    NO GROWTH < 12 HOURS Performed at [facility] [address], [place], [place] [address]    Report Status PENDING  Incomplete  Culture, blood (Routine x 2)     Status: None (Preliminary result)   Collection Time: 11/24/18  5:29 PM  Result Value Ref Range Status   Specimen Description   Final    BLOOD LEFT ARM Performed at [facility] [address] [place], [place] [address]    Special Requests   Final    BOTTLES DRAWN AEROBIC AND ANAEROBIC Blood Culture results may not be optimal due to an excessive volume of blood received in culture bottles Performed at [facility] [address], [place], [place] [address]    Culture   Final    NO GROWTH < 12 HOURS Performed at [facility] [address], [place], [place] [address]    Report Status PENDING  Incomplete  Urine culture     Status: None (Preliminary result)   Collection Time: 11/24/18  7:52 PM  Result Value Ref  Range Status   Specimen Description   Final    URINE, RANDOM Performed at [facility], [place] [address] [place], [place] [address]    Special Requests   Final    NONE Performed at [facility], [place] [address], [place],  [address]    Culture PENDING  Incomplete   Report Status PENDING  Incomplete    Radiology Reports Dg Chest 2 View  Result Date: 11/24/2018 CLINICAL DATA:  Generalized weakness EXAM: CHEST - 2 VIEW COMPARISON:  11/19/2018, CT 10/23/2018, PET-CT 05/10/2018 FINDINGS: Hyperinflated lungs with emphysematous disease. Decreased right hilar mass compared to prior radiograph. No acute consolidation or effusion. Normal heart size. Aortic atherosclerosis. No pneumothorax. IMPRESSION: No active cardiopulmonary disease. Hyperinflation and emphysematous disease. Decreased right hilar mass as compared with prior radiograph Electronically Signed   By: Donavan Foil M.D.   On: 11/24/2018 18:26   Dg Chest 2 View  Result Date: 11/19/2018 CLINICAL DATA:  Generalized weakness, history of lung carcinoma EXAM: CHEST - 2 VIEW COMPARISON:  10/23/2018 FINDINGS: Cardiac shadows within normal limits. Aortic calcifications are again seen. The lungs are mildly hyperinflated consistent with COPD. No focal infiltrate or sizable effusion is noted. No acute bony abnormality is seen. IMPRESSION: COPD without acute abnormality. Electronically Signed   By: Inez Catalina M.D.   On: 11/19/2018 13:00   Dg Wrist Complete Left  Result Date: 11/24/2018 CLINICAL DATA:  Fall with generalized wrist pain EXAM: LEFT WRIST - COMPLETE 3+ VIEW COMPARISON:  None. FINDINGS: Bones appear osteopenic.  Advanced visi deformity on lateral view. IMPRESSION: 1. Possible subtle nondisplaced ulnar styloid process fracture 2. Widened scapholunate interval consistent with ligamentous abnormality. Possible visi deformity though with nonstandard positioning on lateral view. 3. Osteopenia.  Advanced arthritis at  the first [facility] joint Electronically Signed   By: Donavan Foil M.D.   On: 11/24/2018 17:44    Lab Data:  CBC: Recent Labs  Lab 11/19/18 1131 11/24/18 1723 11/25/18 0321  WBC 7.6 6.1 7.1  NEUTROABS  6.0 4.6  --   HGB 10.0* 8.0* 7.8*  HCT 32.9* 26.7* 26.0*  MCV 90.9 91.4 90.9  PLT 390 396 287   Basic Metabolic Panel: Recent Labs  Lab 11/19/18 1131 11/24/18 1725 11/24/18 1925 11/25/18 0321  NA 134* 135  134*  --  135  K 3.7 3.7  3.7  --  3.8  CL 96* 102  100  --  104  CO2 28 25  25   --  22  GLUCOSE 140* 122*  121*  --  79  BUN 17 20  21   --  13  CREATININE 0.72 0.62  0.63  --  0.50*  CALCIUM 9.4 8.2*  8.2*  --  8.1*  MG  --   --  1.8  --   PHOS  --   --  3.4  --    GFR: Estimated Creatinine Clearance: 61.1 mL/min (A) (by C-G formula based on SCr of 0.5 mg/dL (L)). Liver Function Tests: Recent Labs  Lab 11/19/18 1131 11/24/18 1725 11/25/18 0321  AST 19 23 20   ALT 19 19 18   ALKPHOS 130* 85 82  BILITOT 0.5 0.2* 0.6  PROT 7.8 6.3* 5.9*  ALBUMIN 2.7* 2.2* 2.1*   No results for input(s): LIPASE, AMYLASE in the last 168 hours. No results for input(s): AMMONIA in the last 168 hours. Coagulation Profile: No results for input(s): INR, PROTIME in the last 168 hours. Cardiac Enzymes: No results for input(s): CKTOTAL, CKMB, CKMBINDEX, TROPONINI in the last 168 hours. BNP (last 3 results) No results for input(s): PROBNP in the last 8760 hours. HbA1C: No results for input(s): HGBA1C in the last 72 hours. CBG: Recent Labs  Lab 11/24/18 1734  GLUCAP 115*   Lipid Profile: No results for input(s): CHOL, HDL, LDLCALC, TRIG, CHOLHDL, LDLDIRECT in the last 72 hours. Thyroid Function Tests: Recent Labs    11/25/18 0321  TSH 1.195  FREET4 0.99   Anemia Panel: Recent Labs    11/25/18 0321  FERRITIN 657*  TIBC 106*  IRON 17*  RETICCTPCT 1.4   Urine analysis:    Component Value Date/Time   COLORURINE YELLOW 11/24/2018 1952   APPEARANCEUR HAZY (A)  11/24/2018 1952   LABSPEC 1.018 11/24/2018 1952   PHURINE 5.0 11/24/2018 1952   GLUCOSEU NEGATIVE 11/24/2018 1952   HGBUR NEGATIVE 11/24/2018 1952   BILIRUBINUR NEGATIVE 11/24/2018 1952   KETONESUR NEGATIVE 11/24/2018 1952   PROTEINUR NEGATIVE 11/24/2018 1952   NITRITE NEGATIVE 11/24/2018 1952   LEUKOCYTESUR NEGATIVE 11/24/2018 1952     Avontae Burkhead M.D. [facility] 11/25/2018, 11:03 AM  Pager: [phone] Between 7am to 7pm - call Pager - [phone]  After 7pm go to [email or web address] - password [email or web address]  Call night coverage person covering after 7pm

## 2018-11-25 NOTE — Progress Notes (Signed)
Pharmacy Antibiotic Note  Warren Blankenship is a 63 y.o. male admitted on 11/24/2018 with sepsis.  Pharmacy has been consulted for zosyn dosing.  Plan: Zosyn 3.375g IV q8h (4 hour infusion).  Height: 5\' 7"  (170.2 cm) Weight: 102 lb (46.3 kg) IBW/kg (Calculated) : 66.1  Temp (24hrs), Avg:98.1 F (36.7 C), Min:97.5 F (36.4 C), Max:98.6 F (37 C)  Recent Labs  Lab 11/19/18 1131 11/24/18 1723 11/24/18 1725 11/24/18 2051 11/25/18 0321  WBC 7.6 6.1  --   --  7.1  CREATININE 0.72  --  0.62  0.63  --  0.50*  LATICACIDVEN  --  1.6  --  0.7  --     Estimated Creatinine Clearance: 61.1 mL/min (A) (by C-G formula based on SCr of 0.5 mg/dL (L)).    No Known Allergies  Antimicrobials this admission: Vancomycin 11/24/2018 x1 Zosyn 11/24/2018 >>   Dose adjustments this admission: -  Microbiology results: -  Thank you for allowing pharmacy to be a part of this patient's care.  Nani Skillern Crowford 11/25/2018 4:57 AM

## 2018-11-25 NOTE — Progress Notes (Signed)
  Echocardiogram 2D Echocardiogram has been performed.  Warren Blankenship M 11/25/2018, 11:53 AM

## 2018-11-25 NOTE — Progress Notes (Signed)
PHARMACY NOTE -  zosyn  Pharmacy has been assisting with dosing of zosyn for suspected sepsis. Dosage remains stable at 3.375gm IV q8h (infuse over 4 hrs) and need for further dosage adjustment appears unlikely at present.    Will sign off at this time.  Please reconsult if a change in clinical status warrants re-evaluation of dosage.   Dia Sitter, PharmD, BCPS 11/25/2018 10:54 AM

## 2018-11-25 NOTE — Progress Notes (Signed)
Initial Nutrition Assessment  DOCUMENTATION CODES:   Underweight  INTERVENTION:   -Provide Ensure Enlive po BID, each supplement provides 350 kcal and 20 grams of protein -Provide Magic cup BID with meals, each supplement provides 290 kcal and 9 grams of protein  NUTRITION DIAGNOSIS:   Increased nutrient needs related to cancer and cancer related treatments as evidenced by estimated needs.  GOAL:   Patient will meet greater than or equal to 90% of their needs  MONITOR:   PO intake, Supplement acceptance, Labs, Weight trends, I & O's  REASON FOR ASSESSMENT:   Consult Assessment of nutrition requirement/status  ASSESSMENT:   63 year old male with stage III non-small cell lung cancer status post chemotherapy, radiation, now on durvalumab, nicotine abuse presented with failure to thrive and fatigue.    **RD working remotely**  Patient with poor appetite and decreased intakes for ~ 1 week. Pt is followed by [place] RD, last assessed 3/23. At that time, pt was eating well but has continued to lose weight. Pt was diagnosed with severe malnutrition in November 2019. Pt was drinking 1 Boost a day at home. Pt is s/p chemotherapy and radiation therapy for NSCLC.   No PO intakes documented for this admission yet. Pt is receiving house trays. Pt is experiencing taste changes which is most likely related to thrush. RD to order Ensure and Magic cups supplements for patient as he prefers vanilla flavored supplements.  Per weight records, pt's weights have been stable. Suspect pt to continue to have severe malnutrition but unable to diagnose at this time.  Labs reviewed. Medications: Multivitamin with minerals daily  NUTRITION - FOCUSED PHYSICAL EXAM:  Unable to perform per department requirements to work remotely.  Diet Order:   Diet Order            Diet regular Room service appropriate? Yes; Fluid consistency: Thin  Diet effective now              EDUCATION NEEDS:    No education needs have been identified at this time  Skin:  Skin Assessment: Reviewed RN Assessment  Last BM:  4/24  Height:   Ht Readings from Last 1 Encounters:  11/24/18 5\' 7"  (1.702 m)    Weight:   Wt Readings from Last 1 Encounters:  11/24/18 46.3 kg    Ideal Body Weight:  67.3 kg  BMI:  Body mass index is 15.98 kg/m.  Estimated Nutritional Needs:   Kcal:  1800-2000  Protein:  100-110g  Fluid:  2L/day  Clayton Bibles, MS, RD, LDN [place] Dietitian Pager: [phone] After Hours Pager: [phone]

## 2018-11-25 NOTE — Consult Note (Signed)
[facility] Nurse wound consult note Reason for Consult:Hyperkeratotic lesions to fingers and plantar surfaces of feet.  Ongoing radiation and chemotherapy treatments.  Orders have been placed for mupirocin ointment to lesions that are weeping.  No further topical recommendations at this time other than routine care with barrier cream daily with bathing and PRN . Wound type:inflammatory/calloused  Pressure Injury POA: NA Measurement:photos in chart, generalized callouses Wound VEZ:BMZTA hard and hypergranulated.  Drainage (amount, consistency, odor) scant weeping to fingers Periwound:intact Will not follow at this time.  Please re-consult if needed.  Domenic Moras MSN, RN, FNP-BC [facility]ON Wound, Ostomy, Continence Nurse Pager [phone]

## 2018-11-26 ENCOUNTER — Telehealth: Payer: Self-pay | Admitting: Internal Medicine

## 2018-11-26 DIAGNOSIS — Z515 Encounter for palliative care: Secondary | ICD-10-CM

## 2018-11-26 DIAGNOSIS — C3491 Malignant neoplasm of unspecified part of right bronchus or lung: Secondary | ICD-10-CM

## 2018-11-26 DIAGNOSIS — Z7189 Other specified counseling: Secondary | ICD-10-CM

## 2018-11-26 DIAGNOSIS — R531 Weakness: Secondary | ICD-10-CM

## 2018-11-26 LAB — BASIC METABOLIC PANEL
Anion gap: 10 (ref 5–15)
BUN: 12 mg/dL (ref 8–23)
CO2: 24 mmol/L (ref 22–32)
Calcium: 8.4 mg/dL — ABNORMAL LOW (ref 8.9–10.3)
Chloride: 98 mmol/L (ref 98–111)
Creatinine, Ser: 0.63 mg/dL (ref 0.61–1.24)
GFR calc Af Amer: >60 mL/min (ref >60)
GFR calc non Af Amer: >60 mL/min (ref >60)
Glucose, Bld: 66 mg/dL — ABNORMAL LOW (ref 70–99)
Potassium: 3.6 mmol/L (ref 3.5–5.1)
Sodium: 132 mmol/L — ABNORMAL LOW (ref 135–145)

## 2018-11-26 LAB — CBC
HCT: 28.2 % — ABNORMAL LOW (ref 39.0–52.0)
Hemoglobin: 8.5 g/dL — ABNORMAL LOW (ref 13.0–17.0)
MCH: 27.2 pg (ref 26.0–34.0)
MCHC: 30.1 g/dL (ref 30.0–36.0)
MCV: 90.4 fL (ref 80.0–100.0)
Platelets: 385 10*3/uL (ref 150–400)
RBC: 3.12 MIL/uL — ABNORMAL LOW (ref 4.22–5.81)
RDW: 16.3 % — ABNORMAL HIGH (ref 11.5–15.5)
WBC: 7.7 10*3/uL (ref 4.0–10.5)
nRBC: 0 % (ref 0.0–0.2)

## 2018-11-26 LAB — FOLLICLE STIMULATING HORMONE: FSH: 8.5 m[IU]/mL (ref 1.5–12.4)

## 2018-11-26 LAB — AMMONIA: Ammonia: 16 umol/L (ref 9–35)

## 2018-11-26 LAB — LUTEINIZING HORMONE: LH: 4.1 m[IU]/mL (ref 1.7–8.6)

## 2018-11-26 LAB — GLUCOSE, CAPILLARY
Glucose-Capillary: 157 mg/dL — ABNORMAL HIGH (ref 70–99)
Glucose-Capillary: 65 mg/dL — ABNORMAL LOW (ref 70–99)
Glucose-Capillary: 67 mg/dL — ABNORMAL LOW (ref 70–99)

## 2018-11-26 LAB — ACTH: C206 ACTH: 15.5 pg/mL (ref 7.2–63.3)

## 2018-11-26 LAB — TESTOSTERONE: Testosterone: 240 ng/dL — ABNORMAL LOW (ref 264–916)

## 2018-11-26 LAB — PROCALCITONIN: Procalcitonin: <0.1 ng/mL

## 2018-11-26 MED ORDER — DEXTROSE 50 % IV SOLN
INTRAVENOUS | Status: AC
  Administered 2018-11-26: 50 mL
  Filled 2018-11-26: qty 50

## 2018-11-26 MED ORDER — DEXTROSE-NACL 5-0.45 % IV SOLN
INTRAVENOUS | Status: DC
  Administered 2018-11-26 – 2018-11-28 (×4): via INTRAVENOUS

## 2018-11-26 NOTE — Consult Note (Signed)
Consultation Note Date: 11/26/2018   Patient Name: Warren Blankenship  DOB: [birth year]  MRN: [id number]  Age / Sex: 63 y.o., male  PCP: Patient, No Pcp Per Referring Physician: Mendel Corning, MD  Reason for Consultation: Establishing goals of care  HPI/Patient Profile: 63 y.o. male  with past medical history of stage IIIB NSCLC (diagnosed 05/10/18; follows with Dr. Julien Nordmann), s/p chemo/radiation with current immunotherapy treatments, ongoing tobacco abuse, emphysema admitted on 11/24/2018 with weakness, fatigue, fall with left wrist pain. Found to have left wrist fracture with recommendations for splint and outpatient f/u with ortho. Work up for infectious process ongoing (awaiting cultures) although appearing to be less likely. Recent staging CT result in response to treatment in right suprahilar mass and mediastinal lymphadenopathy although also show new left supraclavicular node and left adrenal mass. Albumin 2.1 and BMI 15.98 with poor intake. Also being treated for oral thrush.   Clinical Assessment and Goals of Care: I met today with Warren Blankenship but he is very sleepy and has delayed response. He gives me permission to speak with his children but he just wants to sleep. Denies pain or discomfort but just fatigued.   I called son, Warren Blankenship, but no answer. I then called daughter, Warren Blankenship, and was able to speak with her. He has 4 children total and is not married. He lives independently but his son has been assisting him at home often lately. He has had significant changes in his functional status over the past 3 weeks with weakness, falls, and decreased intake. Warren Blankenship shares with me that her mother died from lung cancer (with a trach) and that she recognizes many signs in her father that she saw in her mother at EOL. Warren Blankenship says that because of this she has had conversations with her father and knows he does not want "to be  brought back" by CPR. She is unsure how he feels about being intubated. She reports that he has told her that he knows he doesn't have long to live and has made his peace.  After further discussion Warren Blankenship shares that she is concerned about his ability to tolerate further treatment. We discussed CT results as well. Warren Blankenship will speak further with her siblings regarding his poor prognosis. Warren Blankenship reports that they would consider bringing him home to care for him if he has less than a month to live but would be unable to care for him at home in this state for months or more.  I will speak with medical team and Warren Blankenship will speak with her siblings. I also hope to have an opportunity to speak more with Warren Blankenship regarding his goals and options. Will f/u with Warren Blankenship tomorrow.    Primary Decision Maker PATIENT with assistance of his children    SUMMARY OF RECOMMENDATIONS   - Will try and discuss further with patient tomorrow - Will arrange family conference call as needed  Code Status/Advance Care Planning:  Limited code - hope to clarify with patients tomorrow   Symptom Management:  Denies pain or discomfort.   Palliative Prophylaxis:   Aspiration, Bowel Regimen, Delirium Protocol and Turn Reposition  Psycho-social/Spiritual:   Desire for further Chaplaincy support:yes  Additional Recommendations: Caregiving  Support/Resources, Education on Hospice and Grief/Bereavement Support  Prognosis:   Dependent on his ability to thrive and maintain adequate blood sugar. Possibly approaching less than a month prognosis.   Discharge Planning: SNF rehab vs home with hospice depending on progress over the next 1-2 days and GOC.       Primary Diagnoses: Present on Admission:  Hypotension  Anemia of chronic disease  FTT (failure to thrive) in adult  Severe protein-calorie malnutrition [name]: less than 60% of standard weight) (HCC)  Normocytic normochromic anemia  Stage III squamous cell  carcinoma of right lung ([place])   I have reviewed the medical record, interviewed the patient and family, and examined the patient. The following aspects are pertinent.  Past Medical History:  Diagnosis Date   Mass of right lung    Tobacco abuse    Social History   Socioeconomic History   Marital status: Divorced    Spouse name: Not on file   Number of children: Not on file   Years of education: Not on file   Highest education level: Not on file  Occupational History   Not on file  Social Needs   Financial resource strain: Very hard   Food insecurity:    Worry: Often true    Inability: Often true   Transportation needs:    Medical: Yes    Non-medical: Yes  Tobacco Use   Smoking status: Current Every Day Smoker    Packs/day: 1.00   Smokeless tobacco: Never Used   Tobacco comment: 10 cigarettes per day 11.5.19 BB LPN  Substance and Sexual Activity   Alcohol use: Yes    Comment: Former    Drug use: Not Currently   Sexual activity: Not Currently  Lifestyle   Physical activity:    Days per week: Not on file    Minutes per session: Not on file   Stress: Not on file  Relationships   Social connections:    Talks on phone: Not on file    Gets together: Not on file    Attends religious service: Not on file    Active member of club or organization: Not on file    Attends meetings of clubs or organizations: Not on file    Relationship status: Not on file  Other Topics Concern   Not on file  Social History Narrative   Currently resides in an apartment alone.    Friend states that he has not bathed since last hospitalization.    Friend states that trash, decaying food, animals, bugs, roaches are all living within the home.       Family History  Problem Relation Age of Onset   Lung disease Father    Scheduled Meds:  enoxaparin (LOVENOX) injection  40 mg Subcutaneous QHS   feeding supplement (ENSURE ENLIVE)  237 mL Oral BID BM   multivitamin  with minerals  1 tablet Oral Daily   mupirocin ointment   Topical TID   nystatin  5 mL Oral QID   sodium chloride flush  3 mL Intravenous Once   sodium chloride flush  3 mL Intravenous Q12H   Continuous Infusions:  piperacillin-tazobactam (ZOSYN)  IV 12.5 mL/hr at 11/26/18 0600   PRN Meds:.albuterol No Known Allergies Review of Systems  Unable to perform ROS: Acuity of condition  Physical Exam Vitals signs and nursing note reviewed.  Constitutional:      Appearance: He is cachectic. He is ill-appearing.  Cardiovascular:     Rate and Rhythm: Normal rate.  Pulmonary:     Effort: Pulmonary effort is normal. No tachypnea, accessory muscle usage or respiratory distress.  Abdominal:     General: Abdomen is flat.  Neurological:     Mental Status: He is lethargic.     Comments: Delayed responses. Unable to have conversation.      Vital Signs: BP 122/80 (BP Location: Right Arm)    Pulse 89    Temp 98 F (36.7 C) (Oral)    Resp 18    Ht '5\' 7"'  (1.702 m)    Wt 46.3 kg    SpO2 99%    BMI 15.98 kg/m  Pain Scale: 0-10 POSS *See Group Information*: 1-Acceptable,Awake and alert Pain Score: 0-No pain   SpO2: SpO2: 99 % O2 Device:SpO2: 99 % O2 Flow Rate: .   IO: Intake/output summary:   Intake/Output Summary (Last 24 hours) at 11/26/2018 1257 Last data filed at 11/26/2018 0900 Gross per 24 hour  Intake 1110.07 ml  Output 3025 ml  Net -1914.93 ml    LBM: Last BM Date: 11/25/18 Baseline Weight: Weight: 46.3 kg Most recent weight: Weight: 46.3 kg     Palliative Assessment/Data:     Time In: 1245 Time Out: 1400 Time Total: 75 min Greater than 50%  of this time was spent counseling and coordinating care related to the above assessment and plan.  Signed by: Vinie Sill, NP Palliative Medicine Team Pager # [phone] (M-F 8a-5p) Team Phone # [phone] (Nights/Weekends)

## 2018-11-26 NOTE — Evaluation (Signed)
Physical Therapy Evaluation Patient Details Name: Warren Blankenship MRN: [id number] DOB: [birth year] Today's Date: 11/26/2018   History of Present Illness  63 year old male with stage III non-small cell lung cancer status post chemotherapy, radiation, now on durvalumab, nicotine abuse presented with failure to thrive and fatigue.  Patient lives at home by himself however son assist with ADLs daily.  PMHx significant for ARF, cachexia, tobacco abuse  Clinical Impression  Pt admitted with above diagnosis. Pt currently with functional limitations due to the deficits listed below (see PT Problem List).  Pt will benefit from skilled PT to increase their independence and safety with mobility to allow discharge to the venue listed below.  Pt responding and agreeable to mobilize however appears lethargic and not assisting with mobility. RN in room and aware. Recommend SNF upon d/c.     Follow Up Recommendations SNF    Equipment Recommendations  None recommended by PT    Recommendations for Other Services       Precautions / Restrictions Precautions Precautions: Fall Required Braces or Orthoses: Other Brace Splint/Cast: L wrist brace Restrictions Other Position/Activity Restrictions: no WB restrictions ordered      Mobility  Bed Mobility Overal bed mobility: Needs Assistance Bed Mobility: Supine to Sit;Sit to Supine     Supine to sit: Max assist;+2 for physical assistance Sit to supine: Max assist;+2 for physical assistance   General bed mobility comments: assist for technique and due to fatigue; pt agreeable to get OOB to recliner for lunch however not assisting with the movement  Transfers Overall transfer level: Needs assistance Equipment used: Rolling walker (2 wheeled) Transfers: Sit to/from Stand Sit to Stand: Mod assist;+2 physical assistance         General transfer comment: pt assisted upright per his request, posterior bias limiting, attempted x2 and pt assisted back to  bed  Ambulation/Gait                Stairs            Wheelchair Mobility    Modified Rankin (Stroke Patients Only)       Balance Overall balance assessment: History of Falls;Needs assistance         Standing balance support: Bilateral upper extremity supported Standing balance-Leahy Scale: Zero                               Pertinent Vitals/Pain Pain Assessment: Faces Faces Pain Scale: Hurts a little bit Pain Location: bil hands Pain Descriptors / Indicators: Tender;Discomfort Pain Intervention(s): Monitored during session;Repositioned    Home Living Family/patient expects to be discharged to:: Private residence Living Arrangements: Children   Type of Home: House Home Access: Stairs to enter Entrance Stairs-Rails: None Technical brewer of Steps: 1+1 Home Layout: One level Home Equipment: Environmental consultant - 2 wheels Additional Comments: poor living conditions per notes (all from previous admission)    Prior Function Level of Independence: Independent with assistive device(s)         Comments: son assists with ADLs per chart     Hand Dominance        Extremity/Trunk Assessment   Upper Extremity Assessment Upper Extremity Assessment: LUE deficits/detail LUE Deficits / Details: L wrist in brace    Lower Extremity Assessment Lower Extremity Assessment: Generalized weakness    Cervical / Trunk Assessment Cervical / Trunk Assessment: (cachexia appearance)  Communication   Communication: No difficulties  Cognition Arousal/Alertness: Lethargic Behavior During Therapy: Flat  affect Overall Cognitive Status: Difficult to assess                                 General Comments: pt very groggy, able to answer some questions however keeps eyes closed unless prompted to open, requested "give me a few minutes" multiple times      General Comments      Exercises     Assessment/Plan    PT Assessment Patient needs  continued PT services  PT Problem List Decreased strength;Decreased mobility;Decreased activity tolerance;Decreased balance;Decreased knowledge of use of DME;Decreased cognition       PT Treatment Interventions DME instruction;Functional mobility training;Balance training;Gait training;Therapeutic activities;Stair training;Therapeutic exercise;Patient/family education    PT Goals (Current goals can be found in the Care Plan section)  Acute Rehab PT Goals PT Goal Formulation: With patient Time For Goal Achievement: 12/10/18 Potential to Achieve Goals: Fair    Frequency Min 3X/week   Barriers to discharge        Co-evaluation               AM-PAC PT "6 Clicks" Mobility  Outcome Measure Help needed turning from your back to your side while in a flat bed without using bedrails?: A Lot Help needed moving from lying on your back to sitting on the side of a flat bed without using bedrails?: Total Help needed moving to and from a bed to a chair (including a wheelchair)?: Total Help needed standing up from a chair using your arms (e.g., wheelchair or bedside chair)?: Total Help needed to walk in hospital room?: Total Help needed climbing 3-5 steps with a railing? : Total 6 Click Score: 7    End of Session Equipment Utilized During Treatment: Gait belt Activity Tolerance: Patient limited by lethargy Patient left: in bed;with call bell/phone within reach;with bed alarm set Nurse Communication: Mobility status PT Visit Diagnosis: Difficulty in walking, not elsewhere classified (R26.2);Muscle weakness (generalized) (M62.81);Unsteadiness on feet (R26.81)    Time: 1129-1150 PT Time Calculation (min) (ACUTE ONLY): 21 min   Charges:   PT Evaluation $PT Eval Low Complexity: Bent, PT, DPT Acute Rehabilitation Services Office: [phone] Pager: [phone]  Trena Platt 11/26/2018, 1:04 PM

## 2018-11-26 NOTE — Telephone Encounter (Signed)
Scheduled appt per 4/28 sch message - unable to reach pt . Left message on daughters phone with appt date and time . Pt should also get appts on discharge papers.

## 2018-11-26 NOTE — Progress Notes (Signed)
[facility]                                                                              Patient Demographics  Warren Blankenship, is a 63 y.o. male, DOB - [birth year], WUX:[id number]  Admit date - 11/24/2018   Admitting Physician Vilma Prader, MD  Outpatient Primary MD for the patient is Patient, No Pcp Per  Outpatient specialists:   LOS - 1  days   Medical records reviewed and are as summarized below:    Chief Complaint  Patient presents with  . Weakness  . Wrist Pain    left       Brief summary   Patient is a 63 year old male with stage III non-small cell lung cancer status post chemotherapy, radiation, now on durvalumab, nicotine abuse presented with failure to thrive and fatigue.  Patient lives at home by himself however son assist with ADLs daily.  Has been having difficulty ambulating and fatigue has gotten worse over the past 1 to 2 weeks and unable to get of the bed which prompted him to seek medical care.  Also reported dizziness, left wrist pain, lack of appetite in the last 6 months.  No nausea or vomiting. Most recent CT scan on 10/23/2018 revealed new left supraclavicular nodes as well as left adrenal mass consistent with metastatic disease In ED, BP 82/61 heart rate 94, patient received 2 L IV fluids, broad-spectrum antibiotics.  BP improved to 96/68.  Chest x-ray showed no pneumonia, left wrist x-ray showed possible nondisplaced ulnar styloid process fracture and osteopenia.   Assessment & Plan    Principal Problem:   Hypotension -In the setting of non-small cell lung CA, failure to thrive, severe protein calorie malnutrition -Patient was given 2 L IV fluid hydration, normal lactic acid, pro calcitonin less than 0.1, no leukocytosis.  Patient was placed on broad-spectrum antibiotics. -Obtain orthostatic vitals, cortisol level normal -BP soft but stable.  Low-grade temp, appears to be lethargic today, for now continue IV antibiotics. - Blood  cultures negative so far, UA negative for UTI -Chest x-ray had shown hyperinflation and emphysematous disease but no pneumonia   Active Problems:   FTT (failure to thrive) in adult -Likely due to severe protein calorie malnutrition, generalized deconditioning, malignancy -PT evaluation recommending skilled nursing facility    Stage III squamous cell carcinoma of right lung ([place]) - poorly differentiated on biopsy 04/30/2019 -Recent CT in 09/2018 had shown improvement in the right hemithorax but progression/metastasis in the left adrenal and left supraclavicular region.  Patient has missed a few durvalumab treatments due to failure to thrive  - Appreciate oncology recommendation.  -Palliative medicine consulted, awaiting goals of care    Normocytic normochromic anemia: Due to anemia of chronic disease and malignancy -Stable 8.5 today.   -Anemia panel showed iron 17, ferritin 657, saturation ratio 16%    Severe protein-calorie malnutrition [name]: less than 60% of standard weight) ([place]) -Dietitian consulted  Nicotine abuse -Counseled on smoking cessation, declines nicotine patch  Left wrist fracture -Left wrist brace  Bilateral nail changes -Hyperkeratotic lesions on both fingers, patient has ongoing radiation and chemotherapy treatments -Wound care  consulted  Oral thrush -Continue nystatin  Emphysema -Currently no wheezing, continue inhalers  Code Status: Partial  DVT Prophylaxis:  Lovenox Family Communication: Discussed in detail with the patient, all imaging results, lab results explained to the patient's daughter, [name] on the phone   Disposition Plan: Discussed with patient's daughter, agreeable with skilled nursing facility.  She states that her sister [name] works in a skilled nursing facility and would like him to be in that SNF.  She requested social worker to call her or Janett Billow.  Time Spent in minutes   25 minutes  Procedures:  None  Consultants:    Oncology discussed with Dr. Julien Nordmann Palliative medicine  Antimicrobials:   Anti-infectives (From admission, onward)   Start     Dose/Rate Route Frequency Ordered Stop   11/25/18 0500  piperacillin-tazobactam (ZOSYN) IVPB 3.375 g     3.375 g 12.5 mL/hr over 240 Minutes Intravenous Every 8 hours 11/25/18 0457     11/24/18 1800  vancomycin (VANCOCIN) IVPB 1000 mg/200 mL premix     1,000 mg 200 mL/hr over 60 Minutes Intravenous STAT 11/24/18 1752 11/24/18 1930   11/24/18 1800  piperacillin-tazobactam (ZOSYN) IVPB 3.375 g     3.375 g 100 mL/hr over 30 Minutes Intravenous STAT 11/24/18 1752 11/24/18 1933         Medications  Scheduled Meds: . enoxaparin (LOVENOX) injection  40 mg Subcutaneous QHS  . feeding supplement (ENSURE ENLIVE)  237 mL Oral BID BM  . multivitamin with minerals  1 tablet Oral Daily  . mupirocin ointment   Topical TID  . nystatin  5 mL Oral QID  . sodium chloride flush  3 mL Intravenous Once  . sodium chloride flush  3 mL Intravenous Q12H   Continuous Infusions: . piperacillin-tazobactam (ZOSYN)  IV 3.375 g (11/26/18 1404)   PRN Meds:.albuterol      Subjective:   [name] was seen and examined today.  Somewhat lethargic today, arousable, no acute complaints.  Low-grade temp of 100.3 F.  Not eating much.  No coughing or abdominal pain.  Patient denies chest pain, shortness of breath.  Denies any nausea vomiting or diarrhea.  Objective:   Vitals:   11/26/18 0054 11/26/18 0102 11/26/18 0615 11/26/18 1346  BP: 97/67  122/80 103/66  Pulse: 97  89 93  Resp: 18  18 19   Temp: 99.9 F (37.7 C) 98.4 F (36.9 C) 98 F (36.7 C) 100.3 F (37.9 C)  TempSrc: Oral Oral Oral Oral  SpO2: 100%  99% 98%  Weight:      Height:        Intake/Output Summary (Last 24 hours) at 11/26/2018 1408 Last data filed at 11/26/2018 0900 Gross per 24 hour  Intake 1110.07 ml  Output 2825 ml  Net -1714.93 ml     Wt Readings from Last 3 Encounters:  11/24/18  46.3 kg  10/21/18 44.6 kg  10/14/18 43.9 kg   Physical Exam  General: Somewhat lethargic but arousable.  Ill-appearing, cachectic  Eyes:   HEENT:    Cardiovascular: S1 S2 clear,  RRR. No pedal edema b/l  Respiratory: CTA B  Gastrointestinal: Soft, nontender, nondistended, NBS  Ext: no pedal edema bilaterally  Neuro: no new deficits  Musculoskeletal: No cyanosis, clubbing  Skin: Hyperkeratotic skin on the bilateral fingers, feet  Psych: Normal affect and demeanor, alert and oriented x3    Data Reviewed:  I have personally reviewed following labs and imaging studies  Micro Results Recent Results (from the past  240 hour(s))  Culture, blood (Routine x 2)     Status: None (Preliminary result)   Collection Time: 11/24/18  5:28 PM  Result Value Ref Range Status   Specimen Description   Final    BLOOD RIGHT HAND Performed at [facility] [address], [place], [place] [address]    Special Requests   Final    BOTTLES DRAWN AEROBIC ONLY Blood Culture adequate volume Performed at [facility] [address] [place], [place] [address]    Culture   Final    NO GROWTH 2 DAYS Performed at [facility] [address], [place], [place] [address]    Report Status PENDING  Incomplete  Culture, blood (Routine x 2)     Status: None (Preliminary result)   Collection Time: 11/24/18  5:29 PM  Result Value Ref Range Status   Specimen Description   Final    BLOOD LEFT ARM Performed at [facility] [address], [place], [place] [address]    Special Requests   Final    BOTTLES DRAWN AEROBIC AND ANAEROBIC Blood Culture results may not be optimal due to an excessive volume of blood received in culture bottles Performed at [facility] [address], [place], [place] [address]    Culture   Final    NO GROWTH 2 DAYS Performed at [facility] [address], [place], [place] [address]     Report Status PENDING  Incomplete  Urine culture     Status: None   Collection Time: 11/24/18  7:52 PM  Result Value Ref Range Status   Specimen Description   Final    URINE, RANDOM Performed at [facility], [place] [address] [place], [place] [address]    Special Requests   Final    NONE Performed at [facility], [place] [address] [place], [place] [address]    Culture   Final    NO GROWTH Performed at [facility], [place] [address], [place],  [address]    Report Status 11/25/2018 FINAL  Final    Radiology Reports Dg Chest 2 View  Result Date: 11/24/2018 CLINICAL DATA:  Generalized weakness EXAM: CHEST - 2 VIEW COMPARISON:  11/19/2018, CT 10/23/2018, PET-CT 05/10/2018 FINDINGS: Hyperinflated lungs with emphysematous disease. Decreased right hilar mass compared to prior radiograph. No acute consolidation or effusion. Normal heart size. Aortic atherosclerosis. No pneumothorax. IMPRESSION: No active cardiopulmonary disease. Hyperinflation and emphysematous disease. Decreased right hilar mass as compared with prior radiograph Electronically Signed   By: Donavan Foil M.D.   On: 11/24/2018 18:26   Dg Chest 2 View  Result Date: 11/19/2018 CLINICAL DATA:  Generalized weakness, history of lung carcinoma EXAM: CHEST - 2 VIEW COMPARISON:  10/23/2018 FINDINGS: Cardiac shadows within normal limits. Aortic calcifications are again seen. The lungs are mildly hyperinflated consistent with COPD. No focal infiltrate or sizable effusion is noted. No acute bony abnormality is seen. IMPRESSION: COPD without acute abnormality. Electronically Signed   By: Inez Catalina M.D.   On: 11/19/2018 13:00   Dg Wrist Complete Left  Result Date: 11/24/2018 CLINICAL DATA:  Fall with generalized wrist pain EXAM: LEFT WRIST - COMPLETE 3+ VIEW COMPARISON:  None. FINDINGS: Bones appear osteopenic.  Advanced visi deformity on lateral view. IMPRESSION: 1. Possible subtle nondisplaced ulnar  styloid process fracture 2. Widened scapholunate interval consistent with ligamentous abnormality. Possible visi deformity though with nonstandard positioning on lateral  view. 3. Osteopenia.  Advanced arthritis at the first [facility] joint Electronically Signed   By: Donavan Foil M.D.   On: 11/24/2018 17:44    Lab Data:  CBC: Recent Labs  Lab 11/24/18 1723 11/25/18 0321 11/26/18 0414  WBC 6.1 7.1 7.7  NEUTROABS 4.6  --   --   HGB 8.0* 7.8* 8.5*  HCT 26.7* 26.0* 28.2*  MCV 91.4 90.9 90.4  PLT 396 363 287   Basic Metabolic Panel: Recent Labs  Lab 11/24/18 1725 11/24/18 1925 11/25/18 0321 11/26/18 0414  NA 135  134*  --  135 132*  K 3.7  3.7  --  3.8 3.6  CL 102  100  --  104 98  CO2 25  25  --  22 24  GLUCOSE 122*  121*  --  79 66*  BUN 20  21  --  13 12  CREATININE 0.62  0.63  --  0.50* 0.63  CALCIUM 8.2*  8.2*  --  8.1* 8.4*  MG  --  1.8  --   --   PHOS  --  3.4  --   --    GFR: Estimated Creatinine Clearance: 61.1 mL/min (by C-G formula based on SCr of 0.63 mg/dL). Liver Function Tests: Recent Labs  Lab 11/24/18 1725 11/25/18 0321  AST 23 20  ALT 19 18  ALKPHOS 85 82  BILITOT 0.2* 0.6  PROT 6.3* 5.9*  ALBUMIN 2.2* 2.1*   No results for input(s): LIPASE, AMYLASE in the last 168 hours. No results for input(s): AMMONIA in the last 168 hours. Coagulation Profile: No results for input(s): INR, PROTIME in the last 168 hours. Cardiac Enzymes: No results for input(s): CKTOTAL, CKMB, CKMBINDEX, TROPONINI in the last 168 hours. BNP (last 3 results) No results for input(s): PROBNP in the last 8760 hours. HbA1C: No results for input(s): HGBA1C in the last 72 hours. CBG: Recent Labs  Lab 11/24/18 1734 11/26/18 1407  GLUCAP 115* 65*   Lipid Profile: No results for input(s): CHOL, HDL, LDLCALC, TRIG, CHOLHDL, LDLDIRECT in the last 72 hours. Thyroid Function Tests: Recent Labs    11/25/18 0321  TSH 1.195  FREET4 0.99   Anemia Panel: Recent Labs     11/25/18 0321  FERRITIN 657*  TIBC 106*  IRON 17*  RETICCTPCT 1.4   Urine analysis:    Component Value Date/Time   COLORURINE YELLOW 11/24/2018 1952   APPEARANCEUR HAZY (A) 11/24/2018 1952   LABSPEC 1.018 11/24/2018 1952   PHURINE 5.0 11/24/2018 1952   GLUCOSEU NEGATIVE 11/24/2018 1952   HGBUR NEGATIVE 11/24/2018 1952   BILIRUBINUR NEGATIVE 11/24/2018 1952   KETONESUR NEGATIVE 11/24/2018 1952   PROTEINUR NEGATIVE 11/24/2018 1952   NITRITE NEGATIVE 11/24/2018 1952   LEUKOCYTESUR NEGATIVE 11/24/2018 1952       M.D. [facility] 11/26/2018, 2:08 PM  Pager: [phone] Between 7am to 7pm - call Pager - [phone]  After 7pm go to [email or web address] - password [email or web address]  Call night coverage person covering after 7pm

## 2018-11-27 DIAGNOSIS — D649 Anemia, unspecified: Secondary | ICD-10-CM

## 2018-11-27 DIAGNOSIS — E43 Unspecified severe protein-calorie malnutrition: Secondary | ICD-10-CM

## 2018-11-27 LAB — BASIC METABOLIC PANEL
Anion gap: 10 (ref 5–15)
BUN: 8 mg/dL (ref 8–23)
CO2: 25 mmol/L (ref 22–32)
Calcium: 8.4 mg/dL — ABNORMAL LOW (ref 8.9–10.3)
Chloride: 97 mmol/L — ABNORMAL LOW (ref 98–111)
Creatinine, Ser: 0.62 mg/dL (ref 0.61–1.24)
GFR calc Af Amer: >60 mL/min (ref >60)
GFR calc non Af Amer: >60 mL/min (ref >60)
Glucose, Bld: 83 mg/dL (ref 70–99)
Potassium: 3.6 mmol/L (ref 3.5–5.1)
Sodium: 132 mmol/L — ABNORMAL LOW (ref 135–145)

## 2018-11-27 LAB — CBC
HCT: 28 % — ABNORMAL LOW (ref 39.0–52.0)
Hemoglobin: 8.5 g/dL — ABNORMAL LOW (ref 13.0–17.0)
MCH: 27.3 pg (ref 26.0–34.0)
MCHC: 30.4 g/dL (ref 30.0–36.0)
MCV: 90 fL (ref 80.0–100.0)
Platelets: 383 10*3/uL (ref 150–400)
RBC: 3.11 MIL/uL — ABNORMAL LOW (ref 4.22–5.81)
RDW: 16.2 % — ABNORMAL HIGH (ref 11.5–15.5)
WBC: 5.7 10*3/uL (ref 4.0–10.5)
nRBC: 0 % (ref 0.0–0.2)

## 2018-11-27 LAB — GLUCOSE, CAPILLARY
Glucose-Capillary: 82 mg/dL (ref 70–99)
Glucose-Capillary: 93 mg/dL (ref 70–99)
Glucose-Capillary: 105 mg/dL — ABNORMAL HIGH (ref 70–99)

## 2018-11-27 MED ORDER — OXYCODONE HCL 5 MG PO TABS
2.5000 mg | ORAL_TABLET | ORAL | Status: DC | PRN
  Administered 2018-11-27 – 2018-11-28 (×2): 2.5 mg via ORAL
  Filled 2018-11-27 (×2): qty 1

## 2018-11-27 MED ORDER — ACETAMINOPHEN 325 MG PO TABS: 650.0000 mg | ORAL_TABLET | Freq: Four times a day (QID) | ORAL | Status: DC | PRN

## 2018-11-27 MED ORDER — MORPHINE SULFATE (CONCENTRATE) 10 MG/0.5ML PO SOLN: 5.0000 mg | ORAL | Status: DC | PRN

## 2018-11-27 MED ORDER — OXYCODONE HCL 5 MG PO TABS: 2.5000 mg | ORAL_TABLET | ORAL | Status: DC | PRN

## 2018-11-27 NOTE — TOC Initial Note (Signed)
Transition of Care [facility]) - Initial/Assessment Note    Patient Details  Name: Warren Blankenship MRN: [id number] Date of Birth: 1956/03/27  Transition of Care [facility]) CM/SW Contact:    Nila Nephew, LCSW Phone Number: [phone] 11/27/2018, 12:05 PM  Clinical Narrative:  Pt admitted from home where he resides alone with support of adult children. Admitted with failure to thrive, anemia, malnutrition, hypotension, has lung cancer and has received radiation and chemotherapy.  CSW discussed disposition planning with daughter [name] via phone today. Pt and family in discussions with providers including PMT re: goals of care going forward. Pt and family concerned that pt may be nearing needing end of life care but also wanting to consider what the goals of further treatment options would be.   [name] states they feel that pt may need to move into SNF as his care requirements are more than can be managed at home. However currently there is no payor source for SNF or for caregivers in the home.   [name] explains pt had insurance that lapsed- unsure of details- and that children are looking into applying for Medicaid/disability for pt.   CSW discussed with daughter the differences in goals of going to SNF for rehab versus going for custodial care and/or hospice care and how payor source impacts placement options. She was very understanding and reports once goals of care is solidified they will be able to plan further.                   Expected Discharge Plan: (TBD) Barriers to Discharge: Continued Medical Work up(goals of care), insurance barriers (pt in process of applying for medicaid and qualifying for disability)   Patient Goals and CMS Choice Patient states their goals for this hospitalization and ongoing recovery are:: determining CMS Medicare.gov Compare Post Acute Care list provided to:: Other (Comment Required)(NA) Choice offered to / list presented to : (pt''s family requesting [facility]  if SNF is plan)  Expected Discharge Plan and Services Expected Discharge Plan: (TBD)     Post Acute Care Choice: [facility] Living arrangements for the past 2 months: Single Family Home                                      Prior Living Arrangements/Services Living arrangements for the past 2 months: Single Family Home Lives with:: Self Patient language and need for interpreter reviewed:: No Do you feel safe going back to the place where you live?: Yes      Need for Family Participation in Patient Care: Yes (Comment)(adult children highly involved) Care giver support system in place?: No (comment)(son assists pt, no permanent caregiver)   Criminal Activity/Legal Involvement Pertinent to Current Situation/Hospitalization: No - Comment as needed  Activities of Daily Living Home Assistive Devices/Equipment: Bedside commode/3-in-1 ADL Screening (condition at time of admission) Patient's cognitive ability adequate to safely complete daily activities?: Yes Is the patient deaf or have difficulty hearing?: No Does the patient have difficulty seeing, even when wearing glasses/contacts?: No Does the patient have difficulty concentrating, remembering, or making decisions?: No Patient able to express need for assistance with ADLs?: Yes Does the patient have difficulty dressing or bathing?: Yes Independently performs ADLs?: No Communication: Independent Dressing (OT): Needs assistance Is this a change from baseline?: Pre-admission baseline Grooming: Needs assistance Is this a change from baseline?: Pre-admission baseline Feeding: Independent Bathing: Needs assistance Is this  a change from baseline?: Pre-admission baseline Toileting: Needs assistance Is this a change from baseline?: Pre-admission baseline In/Out Bed: Needs assistance Is this a change from baseline?: Pre-admission baseline Walks in Home: Needs assistance Is this a change from baseline?:  Pre-admission baseline Does the patient have difficulty walking or climbing stairs?: Yes Weakness of Legs: Both Weakness of Arms/Hands: Both  Permission Sought/Granted Permission sought to share information with : Family Supports, Pharmacist, community Information with NAME: daughters Janett Billow and [name]  Permission granted to share info w AGENCY: [facility]        Emotional Assessment Appearance:: Appears stated age     Orientation: : Oriented to Self, Oriented to Place, Oriented to  Time, Oriented to Situation Alcohol / Substance Use: Not Applicable Psych Involvement: No (comment)  Admission diagnosis:  Rash [R21] Generalized weakness [R53.1] Fall, initial encounter [W19.XXXA] Hypotension, unspecified hypotension type [I95.9] Pain and swelling of left wrist [M25.532, M25.432] Patient Active Problem List   Diagnosis Date Noted  . Dyspnea on exertion 10/14/2018  . Encounter for antineoplastic immunotherapy 07/17/2018  . Goals of care, counseling/discussion 05/14/2018  . Encounter for antineoplastic chemotherapy 05/14/2018  . Stage III squamous cell carcinoma of right lung ([place]) 05/10/2018  . Anemia of chronic disease 05/01/2018  . Hypotension 04/29/2018  . Severe protein-calorie malnutrition [name]: less than 60% of standard weight) ([place]) 04/29/2018  . Hyponatremia 04/29/2018  . Hypokalemia 04/29/2018  . Mediastinal adenopathy   . Hilar adenopathy   . Malignant neoplasm of middle lobe, bronchus or lung ([place]) 04/12/2018  . Transient hypotension 04/12/2018  . Normocytic normochromic anemia 04/12/2018  . Thrombocytosis ([place]) 04/12/2018  . Tobacco use disorder 04/12/2018  . ARF (acute renal failure) ([place]) 03/19/2018  . FTT (failure to thrive) in adult 03/19/2018  . Hyperkalemia 03/19/2018  . Cachexia ([place]) 03/19/2018  . Lactic acidosis 03/19/2018  . Palpitation 03/19/2018   PCP:  Patient, No Pcp Per Pharmacy:   [facility] [address]  [place] [address] [place] [place] [address] Phone: [phone] Fax: [phone]     Social Determinants of Health (SDOH) Interventions    Readmission Risk Interventions No flowsheet data found.

## 2018-11-27 NOTE — Plan of Care (Signed)
Plan of care reviewed and discussed with the patient. 

## 2018-11-27 NOTE — Progress Notes (Signed)
PROGRESS NOTE    Warren Blankenship  HMC:[id number] DOB: May 17, 1956 DOA: 11/24/2018 PCP: Patient, No Pcp Per   Brief Narrative:  HPI on 11/24/2018 by Dr. Cheri Rous This is a 63 year old man with medical problems including stage III non-small cell lung cancer status post chemotherapy plus radiation now on durvalumab (started 04/2018), as well as ongoing smoking presenting with fatigue.  The patient reports living at home by himself, but his son comes daily to assist with his ADLs particularly with bathing.  Reports he has required ADL assistance for the past few months.  He walks with a walker.  He spends over half his time in bed or at rest due to fatigue.  He reports the fatigue has gotten worse over the past 1 to 2 weeks, he was unable to get out of bed which prompted him to seek medical care.  Associated symptoms include dizziness as well as left wrist pain.  He missed his last 2 treatments of durvalumab due to oversleeping as well as having a fall.  Reports decreased p.o. intake due to lack of appetite, denies nausea or vomiting.  He denies fever, chest pain, shortness of breath, hemoptysis, bleeding per rectum.  Last seen in the outpatient oncology clinic on October 21, 2018, at that time heart rate 662, systolic blood pressure 947.  The patient reports chronic skin and nail changes.  Reports left wrist pain.  Most recent CT scan of the chest on October 23, 2018 revealed new left supraclavicular node as well as left adrenal mass consistent with metastatic disease.  The patient currently reports he thinks that his cancer is doing well and responding to treatment appropriately.  It appears the patient was evaluated on November 19, 2018 for generalized weakness and was given IV fluids in the emergency department with some improvement and was able to be discharged home.   Assessment & Plan   Hypotension  -In the setting of non-small cell lung cancer, failure to thrive and protein calorie malnutrition  -Patient was placed on IV fluid hydration -Lactic acid was normal, procalcitonin less than 0.1, cortisol level normal, no leukocytosis -Patient was placed on broad-spectrum antibiotics -Blood cultures show no growth to date, UA unremarkable for infection -Chest x-ray showed hyperinflation and emphysematous disease but no infection  Failure to thrive in adult -Likely due to severe protein calorie malnutrition, generalized deconditioning, malignancy -PT recommended SNF  Stage III squamous cell carcinoma of right lung  -poorly differentiated on biopsy  -Recent CT March 2020 had shown improvement in the right hemithorax, but progression/metastasis in the left adrenal and left supraclavicular region -Patient has missed a few durvalumab treatments due to failure to thrive -Oncology consulted and appreciated -Palliative medicine consulted, awaiting goals of care  Normocytic normochromic anemia/ Anemia of chronic disease and malignancy -Stable -Anemia panel showed iron 17, ferritin 657, saturation ration 16%  Severe protein-calorie malnutrition  -Gomez <60% of standard weight -dietitian consulted   Nicotine Abuse  -Declined nicotine patch  Left wrist fracture -continue brace  Bilateral nail changes -hyperkeratotic lesions on both fingers, patient has ongoing radiation and chemotherapy treatments  -wound care consulted  Oral thrush -continue nystatin  Emphysema  -Currently no wheezing, continue inhalers  Goals of care -Palliative care consulted and appreciated- recommended:Tylenol 650 mg po every 6 hours prn mild pain. OxyIR 2.5 mg po every 4 hours prn moderate pain. Roxanol 5 mg SL every 4 hours prn severe pain or SOB -Code status changed to DNR -Family plans to take patient  home with hospice but would like to get things in order  DVT Prophylaxis  lovenox  Code Status: Full  Family Communication: None a bedside  Disposition Plan: Admitted. Pending home with hospice in 1-2  days  Consultants Palliative care   Procedures  None  Antibiotics   Anti-infectives (From admission, onward)   Start     Dose/Rate Route Frequency Ordered Stop   11/25/18 0500  piperacillin-tazobactam (ZOSYN) IVPB 3.375 g     3.375 g 12.5 mL/hr over 240 Minutes Intravenous Every 8 hours 11/25/18 0457     11/24/18 1800  vancomycin (VANCOCIN) IVPB 1000 mg/200 mL premix     1,000 mg 200 mL/hr over 60 Minutes Intravenous STAT 11/24/18 1752 11/24/18 1930   11/24/18 1800  piperacillin-tazobactam (ZOSYN) IVPB 3.375 g     3.375 g 100 mL/hr over 30 Minutes Intravenous STAT 11/24/18 1752 11/24/18 1933      Subjective:   Warren Blankenship seen and examined today.  Patient appears to be very lethargic today.  Has no complaints, and wants to go back to sleep.  Objective:   Vitals:   11/26/18 2010 11/26/18 2218 11/27/18 0553 11/27/18 1413  BP:  107/70 (!) 97/54 (!) 82/60  Pulse: 88 91 (!) 103 90  Resp: 16 16 18 16   Temp:  98.8 F (37.1 C) 98.7 F (37.1 C)   TempSrc:  Oral Oral   SpO2:  98% 97% 99%  Weight:      Height:        Intake/Output Summary (Last 24 hours) at 11/27/2018 1601 Last data filed at 11/27/2018 1400 Gross per 24 hour  Intake 1468.97 ml  Output 2050 ml  Net -581.03 ml   Filed Weights   11/24/18 1639  Weight: 46.3 kg    Exam  General: Well developed, chronically ill-appearing, cachectic, NAD  HEENT: NCAT,  mucous membranes moist.   Cardiovascular: S1 S2 auscultated, RRR  Respiratory: Clear to auscultation bilaterally  Abdomen: Soft, nontender, nondistended, + bowel sounds  Extremities: warm dry without cyanosis clubbing or edema  Data Reviewed: I have personally reviewed following labs and imaging studies  CBC: Recent Labs  Lab 11/24/18 1723 11/25/18 0321 11/26/18 0414 11/27/18 0358  WBC 6.1 7.1 7.7 5.7  NEUTROABS 4.6  --   --   --   HGB 8.0* 7.8* 8.5* 8.5*  HCT 26.7* 26.0* 28.2* 28.0*  MCV 91.4 90.9 90.4 90.0  PLT 396 363 385 169    Basic Metabolic Panel: Recent Labs  Lab 11/24/18 1725 11/24/18 1925 11/25/18 0321 11/26/18 0414 11/27/18 0358  NA 135  134*  --  135 132* 132*  K 3.7  3.7  --  3.8 3.6 3.6  CL 102  100  --  104 98 97*  CO2 25  25  --  22 24 25   GLUCOSE 122*  121*  --  79 66* 83  BUN 20  21  --  13 12 8   CREATININE 0.62  0.63  --  0.50* 0.63 0.62  CALCIUM 8.2*  8.2*  --  8.1* 8.4* 8.4*  MG  --  1.8  --   --   --   PHOS  --  3.4  --   --   --    GFR: Estimated Creatinine Clearance: 61.1 mL/min (by C-G formula based on SCr of 0.62 mg/dL). Liver Function Tests: Recent Labs  Lab 11/24/18 1725 11/25/18 0321  AST 23 20  ALT 19 18  ALKPHOS 85 82  BILITOT 0.2*  0.6  PROT 6.3* 5.9*  ALBUMIN 2.2* 2.1*   No results for input(s): LIPASE, AMYLASE in the last 168 hours. Recent Labs  Lab 11/26/18 1454  AMMONIA 16   Coagulation Profile: No results for input(s): INR, PROTIME in the last 168 hours. Cardiac Enzymes: No results for input(s): CKTOTAL, CKMB, CKMBINDEX, TROPONINI in the last 168 hours. BNP (last 3 results) No results for input(s): PROBNP in the last 8760 hours. HbA1C: No results for input(s): HGBA1C in the last 72 hours. CBG: Recent Labs  Lab 11/26/18 1407 11/26/18 1426 11/26/18 1711 11/27/18 0806 11/27/18 1155  GLUCAP 65* 157* 67* 82 93   Lipid Profile: No results for input(s): CHOL, HDL, LDLCALC, TRIG, CHOLHDL, LDLDIRECT in the last 72 hours. Thyroid Function Tests: Recent Labs    11/25/18 0321  TSH 1.195  FREET4 0.99   Anemia Panel: Recent Labs    11/25/18 0321  FERRITIN 657*  TIBC 106*  IRON 17*  RETICCTPCT 1.4   Urine analysis:    Component Value Date/Time   COLORURINE YELLOW 11/24/2018 1952   APPEARANCEUR HAZY (A) 11/24/2018 1952   LABSPEC 1.018 11/24/2018 1952   PHURINE 5.0 11/24/2018 1952   GLUCOSEU NEGATIVE 11/24/2018 1952   HGBUR NEGATIVE 11/24/2018 1952   BILIRUBINUR NEGATIVE 11/24/2018 1952   KETONESUR NEGATIVE 11/24/2018 1952    PROTEINUR NEGATIVE 11/24/2018 1952   NITRITE NEGATIVE 11/24/2018 1952   LEUKOCYTESUR NEGATIVE 11/24/2018 1952   Sepsis Labs: @LABRCNTIP (procalcitonin:4,lacticidven:4)  ) Recent Results (from the past 240 hour(s))  Culture, blood (Routine x 2)     Status: None (Preliminary result)   Collection Time: 11/24/18  5:28 PM  Result Value Ref Range Status   Specimen Description   Final    BLOOD RIGHT HAND Performed at [facility], [place] [address], [place], [place] [address]    Special Requests   Final    BOTTLES DRAWN AEROBIC ONLY Blood Culture adequate volume Performed at [place] [address], [place], [place] [address]    Culture   Final    NO GROWTH 3 DAYS Performed at [facility], [place] [address], [place], [place] [address]    Report Status PENDING  Incomplete  Culture, blood (Routine x 2)     Status: None (Preliminary result)   Collection Time: 11/24/18  5:29 PM  Result Value Ref Range Status   Specimen Description   Final    BLOOD LEFT ARM Performed at [facility] [address], [place], [place] [address]    Special Requests   Final    BOTTLES DRAWN AEROBIC AND ANAEROBIC Blood Culture results may not be optimal due to an excessive volume of blood received in culture bottles Performed at [place] [address], [place], [place] [address]    Culture   Final    NO GROWTH 3 DAYS Performed at [facility], [place] [address] [place], [place] [address]    Report Status PENDING  Incomplete  Urine culture     Status: None   Collection Time: 11/24/18  7:52 PM  Result Value Ref Range Status   Specimen Description   Final    URINE, RANDOM Performed at [facility], [place] [address], [place], [place] [address]    Special Requests   Final    NONE Performed at [facility], [place] [address] [place], [place] [address]    Culture   Final    NO GROWTH  Performed  at [facility], [place] [address] [place], [place] [address]    Report Status 11/25/2018 FINAL  Final      Radiology Studies: No results found.   Scheduled Meds: . enoxaparin (LOVENOX) injection  40 mg Subcutaneous QHS  . feeding supplement (ENSURE ENLIVE)  237 mL Oral BID BM  . multivitamin with minerals  1 tablet Oral Daily  . mupirocin ointment   Topical TID  . nystatin  5 mL Oral QID  . sodium chloride flush  3 mL Intravenous Once  . sodium chloride flush  3 mL Intravenous Q12H   Continuous Infusions: . dextrose 5 % and 0.45% NaCl 75 mL/hr at 11/27/18 1540  . piperacillin-tazobactam (ZOSYN)  IV 3.375 g (11/27/18 1427)     LOS: 2 days   Time Spent in minutes   30 minutes  Gayna Braddy D.O. on 11/27/2018 at 4:01 PM  Between 7am to 7pm - Please see pager noted on [email or web address]  After 7pm go to [email or web address]  And look for the night coverage person covering for me after hours  [facility] Office  [phone]

## 2018-11-27 NOTE — Progress Notes (Signed)
Hydrologist [facility])  Hospital Liaison: RN note  Notified by Trinda Pascal, [place] of patient/family request for [facility] services at home after discharge. Chart and patient information under review by [facility] physician. Hospice eligibility pending at this time.     Writer spoke with Janett Billow, Daughter by phone to initiate education related to hospice philosophy, services and team approach to care.  Janett Billow verbalized understanding of information given. Per discussion, plan is for discharge to home by [facility] at undecided date.     Please send signed and completed DNR form home with patient/family. Patient will need prescriptions for discharge comfort medications.     DME needs have been discussed, patient currently has the following equipment in the home: none.  Patient/family requests the following DME for delivery to the home: hospital bed, 3N1. [facility] equipment manager has been notified and will contact [employer] to arrange delivery to the home. Home address has been verified and is correct in the chart. Lynnae Sandhoff is the family member to contact to arrange time of delivery.     [facility] aware of the above. Please notify [facility] when patient is ready to leave the unit at discharge. (Call [phone] or [phone] after 5pm.) [facility] information and contact numbers given to [facility]      Please call with any hospice related questions.     Thank you for this referral.     Farrel Gordon, RN, CCM  [facility] (listed on AMION under Hospice and [place] [facility] [place])  [phone]

## 2018-11-27 NOTE — Progress Notes (Addendum)
Palliative:  I met today with a very lethargic Warren Blankenship. He can barely open his eyes and just wants to sleep. I have checked on him yesterday and this morning and he was also sleeping. He humored me today for a brief conversation. I explained to him that I am very worried and his children are worried about him. He is sleeping all the time and unable to eat or drink. He says "nothing tastes any good." He is not even drinking liquids either. I explained that I worry if he could tolerate more treatment for his lung cancer and asked him what he thought. He shakes his head no. We discussed how he would like to spend his time if his time is very short. I believe he would want to return home but concerned about burdening his family. He agrees I should speak with his children about options. He is open to hospice. I asked about code status and if he would want to be placed on life support and he immediately nods his head no and says "I don't want any of that." DNR placed. He also has pain in left wrist and cannot move his left arm. Also with generalized discomfort so pain medication ordered.  I spoke with both Warren Blankenship and Warren Blankenship (2 of his 4 children) about plans moving forward. They both clearly state that they wish to take him home. All 4 children have been in communication and prepared to do their parts to care for him at home. They admit that they did not know how ill he was and the state of his home until recently and they have already been working to clean his home and have even thrown out some of his furniture. They feel good about being able to follow his wishes and care for him in his last days. They cared for their mother under hospice as well so they are prepared.   All questions/concerns addressed. Emotional support provided.   Exam: Lethargic. Cachectic. No distress. Generalized discomfort. Pain to left arm with even slight movement - unable to move himself.   Plan: - Coordinate with family to care  for him at home with help from hospice. Prognosis poor and likely only weeks or less.  - Pain: Tylenol 650 mg po every 6 hours prn mild pain. OxyIR 2.5 mg po every 4 hours prn moderate pain. Roxanol 5 mg SL every 4 hours prn severe pain or SOB.   78 min  Vinie Sill, NP Palliative Medicine Team Pager # [phone] (M-F 8a-5p) Team Phone # [phone] (Nights/Weekends)

## 2018-11-28 ENCOUNTER — Telehealth: Payer: Self-pay | Admitting: Internal Medicine

## 2018-11-28 LAB — CBC
HCT: 28.7 % — ABNORMAL LOW (ref 39.0–52.0)
Hemoglobin: 8.3 g/dL — ABNORMAL LOW (ref 13.0–17.0)
MCH: 26.3 pg (ref 26.0–34.0)
MCHC: 28.9 g/dL — ABNORMAL LOW (ref 30.0–36.0)
MCV: 91.1 fL (ref 80.0–100.0)
Platelets: 349 10*3/uL (ref 150–400)
RBC: 3.15 MIL/uL — ABNORMAL LOW (ref 4.22–5.81)
RDW: 16.2 % — ABNORMAL HIGH (ref 11.5–15.5)
WBC: 5.4 10*3/uL (ref 4.0–10.5)
nRBC: 0 % (ref 0.0–0.2)

## 2018-11-28 LAB — GLUCOSE, CAPILLARY
Glucose-Capillary: 109 mg/dL — ABNORMAL HIGH (ref 70–99)
Glucose-Capillary: 116 mg/dL — ABNORMAL HIGH (ref 70–99)
Glucose-Capillary: 87 mg/dL (ref 70–99)

## 2018-11-28 LAB — BASIC METABOLIC PANEL
Anion gap: 10 (ref 5–15)
BUN: 7 mg/dL — ABNORMAL LOW (ref 8–23)
CO2: 25 mmol/L (ref 22–32)
Calcium: 8.2 mg/dL — ABNORMAL LOW (ref 8.9–10.3)
Chloride: 94 mmol/L — ABNORMAL LOW (ref 98–111)
Creatinine, Ser: 0.66 mg/dL (ref 0.61–1.24)
GFR calc Af Amer: >60 mL/min (ref >60)
GFR calc non Af Amer: >60 mL/min (ref >60)
Glucose, Bld: 98 mg/dL (ref 70–99)
Potassium: 3.6 mmol/L (ref 3.5–5.1)
Sodium: 129 mmol/L — ABNORMAL LOW (ref 135–145)

## 2018-11-28 NOTE — Progress Notes (Signed)
PROGRESS NOTE    RAYVON DAKIN  YKD:[id number] DOB: 12-25-1955 DOA: 11/24/2018 PCP: Patient, No Pcp Per   Brief Narrative:  HPI on 11/24/2018 by Dr. Cheri Rous This is a 63 year old man with medical problems including stage III non-small cell lung cancer status post chemotherapy plus radiation now on durvalumab (started 04/2018), as well as ongoing smoking presenting with fatigue.  The patient reports living at home by himself, but his son comes daily to assist with his ADLs particularly with bathing.  Reports he has required ADL assistance for the past few months.  He walks with a walker.  He spends over half his time in bed or at rest due to fatigue.  He reports the fatigue has gotten worse over the past 1 to 2 weeks, he was unable to get out of bed which prompted him to seek medical care.  Associated symptoms include dizziness as well as left wrist pain.  He missed his last 2 treatments of durvalumab due to oversleeping as well as having a fall.  Reports decreased p.o. intake due to lack of appetite, denies nausea or vomiting.  He denies fever, chest pain, shortness of breath, hemoptysis, bleeding per rectum.  Last seen in the outpatient oncology clinic on October 21, 2018, at that time heart rate 397, systolic blood pressure 673.  The patient reports chronic skin and nail changes.  Reports left wrist pain.  Most recent CT scan of the chest on October 23, 2018 revealed new left supraclavicular node as well as left adrenal mass consistent with metastatic disease.  The patient currently reports he thinks that his cancer is doing well and responding to treatment appropriately.  It appears the patient was evaluated on November 19, 2018 for generalized weakness and was given IV fluids in the emergency department with some improvement and was able to be discharged home.  Interim history Admitted with hypotension secondary to unknown source, however suspected to be multifactorial in the setting of lung  cancer failure to thrive and malnutrition.  Palliative care was consulted and appreciated and discussed with family, the plan is for patient to return home with hospice. Assessment & Plan   Hypotension  -In the setting of non-small cell lung cancer, failure to thrive and protein calorie malnutrition -Patient was placed on IV fluid hydration -Lactic acid was normal, procalcitonin less than 0.1, cortisol level normal, no leukocytosis -Patient was placed on broad-spectrum antibiotics -Blood cultures show no growth to date, UA unremarkable for infection -Chest x-ray showed hyperinflation and emphysematous disease but no infection  Failure to thrive in adult -Likely due to severe protein calorie malnutrition, generalized deconditioning, malignancy -PT recommended SNF  Stage III squamous cell carcinoma of right lung  -poorly differentiated on biopsy  -Recent CT March 2020 had shown improvement in the right hemithorax, but progression/metastasis in the left adrenal and left supraclavicular region -Patient has missed a few durvalumab treatments due to failure to thrive -Oncology consulted and appreciated -Palliative medicine consulted, awaiting goals of care  Normocytic normochromic anemia/ Anemia of chronic disease and malignancy -Stable -Anemia panel showed iron 17, ferritin 657, saturation ration 16%  Severe protein-calorie malnutrition  -Gomez <60% of standard weight -dietitian consulted   Nicotine Abuse  -Declined nicotine patch  Left wrist fracture -continue brace  Bilateral nail changes -hyperkeratotic lesions on both fingers, patient has ongoing radiation and chemotherapy treatments  -wound care consulted  Oral thrush -continue nystatin  Emphysema  -Currently no wheezing, continue inhalers  Goals of care -Palliative care  consulted and appreciated- recommended:Tylenol 650 mg po every 6 hours prn mild pain. OxyIR 2.5 mg po every 4 hours prn moderate pain. Roxanol 5 mg SL  every 4 hours prn severe pain or SOB -Code status changed to DNR -Family plans to take patient home with hospice but would like to get things in order  DVT Prophylaxis  lovenox  Code Status: Full  Family Communication: None a bedside. Daughter, [name], via phone.  Disposition Plan: Admitted. Pending home with hospice likely on 11/29/2018  Consultants Palliative care   Procedures  None  Antibiotics   Anti-infectives (From admission, onward)   Start     Dose/Rate Route Frequency Ordered Stop   11/25/18 0500  piperacillin-tazobactam (ZOSYN) IVPB 3.375 g     3.375 g 12.5 mL/hr over 240 Minutes Intravenous Every 8 hours 11/25/18 0457     11/24/18 1800  vancomycin (VANCOCIN) IVPB 1000 mg/200 mL premix     1,000 mg 200 mL/hr over 60 Minutes Intravenous STAT 11/24/18 1752 11/24/18 1930   11/24/18 1800  piperacillin-tazobactam (ZOSYN) IVPB 3.375 g     3.375 g 100 mL/hr over 30 Minutes Intravenous STAT 11/24/18 1752 11/24/18 1933      Subjective:   Daxten Kovalenko seen and examined today.  Patient has no complaints today and feeling very tired. Objective:   Vitals:   11/27/18 0553 11/27/18 1413 11/27/18 2140 11/28/18 0547  BP: (!) 97/54 (!) 82/60 90/62 101/61  Pulse: (!) 103 90 95 93  Resp: 18 16 16 20   Temp: 98.7 F (37.1 C)  99.3 F (37.4 C) 98.8 F (37.1 C)  TempSrc: Oral  Oral Oral  SpO2: 97% 99% 97% 96%  Weight:      Height:        Intake/Output Summary (Last 24 hours) at 11/28/2018 0903 Last data filed at 11/28/2018 0600 Gross per 24 hour  Intake 1698.94 ml  Output 1225 ml  Net 473.94 ml   Filed Weights   11/24/18 1639  Weight: 46.3 kg   Exam  General: Well developed, chronically ill-appearing, cachectic, NAD  HEENT: NCAT, mucous membranes moist.   Cardiovascular: S1 S2 auscultated, RRR  Respiratory: Clear to auscultation bilaterally  Abdomen: Soft, nontender, nondistended, + bowel sounds  Extremities: warm dry without cyanosis clubbing or edema  Data  Reviewed: I have personally reviewed following labs and imaging studies  CBC: Recent Labs  Lab 11/24/18 1723 11/25/18 0321 11/26/18 0414 11/27/18 0358 11/28/18 0423  WBC 6.1 7.1 7.7 5.7 5.4  NEUTROABS 4.6  --   --   --   --   HGB 8.0* 7.8* 8.5* 8.5* 8.3*  HCT 26.7* 26.0* 28.2* 28.0* 28.7*  MCV 91.4 90.9 90.4 90.0 91.1  PLT 396 363 385 383 170   Basic Metabolic Panel: Recent Labs  Lab 11/24/18 1725 11/24/18 1925 11/25/18 0321 11/26/18 0414 11/27/18 0358 11/28/18 0423  NA 135  134*  --  135 132* 132* 129*  K 3.7  3.7  --  3.8 3.6 3.6 3.6  CL 102  100  --  104 98 97* 94*  CO2 25  25  --  22 24 25 25   GLUCOSE 122*  121*  --  79 66* 83 98  BUN 20  21  --  13 12 8  7*  CREATININE 0.62  0.63  --  0.50* 0.63 0.62 0.66  CALCIUM 8.2*  8.2*  --  8.1* 8.4* 8.4* 8.2*  MG  --  1.8  --   --   --   --  PHOS  --  3.4  --   --   --   --    GFR: Estimated Creatinine Clearance: 61.1 mL/min (by C-G formula based on SCr of 0.66 mg/dL). Liver Function Tests: Recent Labs  Lab 11/24/18 1725 11/25/18 0321  AST 23 20  ALT 19 18  ALKPHOS 85 82  BILITOT 0.2* 0.6  PROT 6.3* 5.9*  ALBUMIN 2.2* 2.1*   No results for input(s): LIPASE, AMYLASE in the last 168 hours. Recent Labs  Lab 11/26/18 1454  AMMONIA 16   Coagulation Profile: No results for input(s): INR, PROTIME in the last 168 hours. Cardiac Enzymes: No results for input(s): CKTOTAL, CKMB, CKMBINDEX, TROPONINI in the last 168 hours. BNP (last 3 results) No results for input(s): PROBNP in the last 8760 hours. HbA1C: No results for input(s): HGBA1C in the last 72 hours. CBG: Recent Labs  Lab 11/26/18 1711 11/27/18 0806 11/27/18 1155 11/27/18 1709 11/28/18 0802  GLUCAP 67* 82 93 105* 109*   Lipid Profile: No results for input(s): CHOL, HDL, LDLCALC, TRIG, CHOLHDL, LDLDIRECT in the last 72 hours. Thyroid Function Tests: No results for input(s): TSH, T4TOTAL, FREET4, T3FREE, THYROIDAB in the last 72 hours.  Anemia Panel: No results for input(s): VITAMINB12, FOLATE, FERRITIN, TIBC, IRON, RETICCTPCT in the last 72 hours. Urine analysis:    Component Value Date/Time   COLORURINE YELLOW 11/24/2018 1952   APPEARANCEUR HAZY (A) 11/24/2018 1952   LABSPEC 1.018 11/24/2018 1952   PHURINE 5.0 11/24/2018 1952   GLUCOSEU NEGATIVE 11/24/2018 1952   HGBUR NEGATIVE 11/24/2018 1952   BILIRUBINUR NEGATIVE 11/24/2018 [place] NEGATIVE 11/24/2018 1952   PROTEINUR NEGATIVE 11/24/2018 1952   NITRITE NEGATIVE 11/24/2018 1952   LEUKOCYTESUR NEGATIVE 11/24/2018 1952   Sepsis Labs: @LABRCNTIP (procalcitonin:4,lacticidven:4)  ) Recent Results (from the past 240 hour(s))  Culture, blood (Routine x 2)     Status: None (Preliminary result)   Collection Time: 11/24/18  5:28 PM  Result Value Ref Range Status   Specimen Description   Final    BLOOD RIGHT HAND Performed at [facility], [place] [address] [place], [place] [address]    Special Requests   Final    BOTTLES DRAWN AEROBIC ONLY Blood Culture adequate volume Performed at [place] [address] [place], [place] [address]    Culture   Final    NO GROWTH 4 DAYS Performed at [facility], [place] [address], [place], [place] [address]    Report Status PENDING  Incomplete  Culture, blood (Routine x 2)     Status: None (Preliminary result)   Collection Time: 11/24/18  5:29 PM  Result Value Ref Range Status   Specimen Description   Final    BLOOD LEFT ARM Performed at [facility] [address], [place], [place] [address]    Special Requests   Final    BOTTLES DRAWN AEROBIC AND ANAEROBIC Blood Culture results may not be optimal due to an excessive volume of blood received in culture bottles Performed at [facility] [address], [place], [place] [address]    Culture   Final    NO GROWTH 4 DAYS Performed at [facility], [place] [address]  [place], [place] [address]    Report Status PENDING  Incomplete  Urine culture     Status: None   Collection Time: 11/24/18  7:52 PM  Result Value Ref Range Status   Specimen Description   Final  URINE, RANDOM Performed at [facility], [place] [address] [place], [place] [address]    Special Requests   Final    NONE Performed at [facility], [place] [address] [place], [place] [address]    Culture   Final    NO GROWTH Performed at [facility], [place] [address] [place], [place] [address]    Report Status 11/25/2018 FINAL  Final      Radiology Studies: No results found.   Scheduled Meds: . enoxaparin (LOVENOX) injection  40 mg Subcutaneous QHS  . feeding supplement (ENSURE ENLIVE)  237 mL Oral BID BM  . multivitamin with minerals  1 tablet Oral Daily  . mupirocin ointment   Topical TID  . nystatin  5 mL Oral QID  . sodium chloride flush  3 mL Intravenous Once  . sodium chloride flush  3 mL Intravenous Q12H   Continuous Infusions: . dextrose 5 % and 0.45% NaCl 75 mL/hr at 11/28/18 0600  . piperacillin-tazobactam (ZOSYN)  IV 3.375 g (11/28/18 0556)     LOS: 3 days   Time Spent in minutes   30 minutes  Parag Dorton D.O. on 11/28/2018 at 9:03 AM  Between 7am to 7pm - Please see pager noted on [email or web address]  After 7pm go to [email or web address]  And look for the night coverage person covering for me after hours  [facility] Office  [phone]

## 2018-11-28 NOTE — Progress Notes (Signed)
PT Cancellation Note  Patient Details Name: Warren Blankenship MRN: [id number] DOB: Dec 22, 1955   Cancelled Treatment:     pt declined any OOB activity requesting to rest.  Pt plans to D/C to home with Hospice per chart review.    Rica Koyanagi  PTA Acute  Rehabilitation Services Pager      [phone] Office      [phone]

## 2018-11-28 NOTE — Progress Notes (Signed)
Palliative:  Warren Blankenship continue to be very lethargic. I visited with him today. He agrees with plan to return home with hospice as discussed with his children. He has no concerns about this plan. He is happy to be returning home. He also confirms that the pain medication helped him last night and agrees to continue. I explain that he will have this available to him when he returns home. He falls back asleep even during our conversation.   I called and spoke with daughter, Warren Blankenship. Warren Blankenship confirms that there are preparing his home and making it clean and suitable to return (there was concern for his care and state of his home prior to children stepping in she explains). She inquires about time of equipment delivery but unfortunately I am not privy to this information and point her towards hospice as they contract with a company to provide and deliver equipment and the family should receive a phone call to schedule a delivery window. She has no further questions or concerns. Emotional support provided. Everything coming together for return to home with hospice care under the care of his children soon. Warren Blankenship has my contact if any further questions/concerns arise.   Exam: Lethargic, cachectic. No distress.   Plan: - Home with hospice. Prognosis likely weeks or less d/t lung cancer.   20 min  Vinie Sill, NP Palliative Medicine Team Pager # [phone] (M-F 8a-5p) Team Phone # [phone] (Nights/Weekends)

## 2018-11-28 NOTE — Progress Notes (Addendum)
[facility])  Hospice  Pt is eligible for hospice services once discharged.    Please notify [facility] when patient is ready to leave the unit at discharge. (Call [phone] or [phone] after 5pm.)   Thank you, Venia Carbon RN, BSN, [facility] Liaison [phone]  **update- confirmed with daughter [name] that pt is eligible for hospice services at home.  She states no one has called with scheduling DME delivery, but once that is delivered they are ready to take their dad home.  Called [employer], left message to return my call and/or call Lynnae Sandhoff to schedule a delivery time Lynnae Sandhoff missed a call earlier and felt it could have been [employer]).

## 2018-11-28 NOTE — Telephone Encounter (Signed)
Per 4/30 schedule message from [facility] (inpt) cancel all appointments patient discharged to hospice.

## 2018-11-28 NOTE — Progress Notes (Signed)
Brief Oncology Note:  Warren Blankenship has been reviewed. I have been in contact with the Palliative Care team. The patient and his family have decided to forgo any further treatment for his lung cancer. He will be discharged home with Hospice. I have sent a message to the [facility] to cancel his remaining appts.  Please call Oncology if any questions arise.  [name], DNP, AGPCNP-BC, AOCNP

## 2018-11-29 LAB — CULTURE, BLOOD (ROUTINE X 2)
Culture: NO GROWTH
Culture: NO GROWTH
Special Requests: ADEQUATE

## 2018-11-29 LAB — GLUCOSE, CAPILLARY
Glucose-Capillary: 102 mg/dL — ABNORMAL HIGH (ref 70–99)
Glucose-Capillary: 88 mg/dL (ref 70–99)

## 2018-11-29 MED ORDER — ACETAMINOPHEN 325 MG PO TABS: 650.0000 mg | ORAL_TABLET | Freq: Four times a day (QID) | ORAL | Status: AC | PRN

## 2018-11-29 MED ORDER — MORPHINE SULFATE (CONCENTRATE) 10 MG/0.5ML PO SOLN: 5.0000 mg | ORAL | 0 refills | Status: AC | PRN

## 2018-11-29 MED ORDER — OXYCODONE HCL 5 MG PO TABS: 2.5000 mg | ORAL_TABLET | ORAL | 0 refills | Status: AC | PRN

## 2018-11-29 MED ORDER — ENSURE ENLIVE PO LIQD: 1.0000 | Freq: Two times a day (BID) | ORAL | 0 refills | Status: AC

## 2018-11-29 NOTE — Progress Notes (Signed)
Manufacturing engineer ([facility])  Spoke with daughter [name], she states no one has reached out to them to deliver DME.  Followed back up with [employer], they will contact them ASAP to deliver DME.  Will update once [name] confirms with me delivery time.  Thank you, Venia Carbon RN, BSN, [facility] Liaison  [phone]

## 2018-11-29 NOTE — Discharge Instructions (Signed)
Hospice °Hospice is a service that is designed to provide people who are terminally ill and their families with medical, spiritual, and psychological support. Its aim is to improve your quality of life by keeping you as comfortable as possible in the final stages of life. °Who will be my providers when I begin hospice care? °Hospice teams often include: °· A nurse. °· A doctor. The hospice doctor will be available for your care, but you can include your regular doctor or nurse practitioner. °· A social worker. °· A counselor. °· A religious leader (such as a chaplain). °· A dietitian. °· Therapists. °· Trained volunteers who can help with care. °What services does hospice provide? °Hospice services can vary depending on the center or organization. Generally, they include: °· Ways to keep you comfortable, such as: °? Providing care in your home or in a home-like setting. °? Working with your family and friends to help meet your needs. °? Allowing you to enjoy the support of loved ones by receiving much of your basic care from family and friends. °· Pain relief and symptom management. The staff will supply all necessary medicines and equipment so that you can stay comfortable and alert enough to enjoy the company of your friends and family. °· Visits or care from a nurse and doctor. This may include 24-hour on-call services. °· Companionship when you are alone. °· Allowing you and your family to rest. Hospice staff may do light housekeeping, prepare meals, and run errands. °· Counseling. They will make sure your emotional, spiritual, and social needs are being met, as well as those needs of your family members. °· Spiritual care. This will be individualized to meet your needs and your family's needs. It may involve: °? Helping you and your family understand the dying process. °? Helping you say goodbye to your family and friends. °? Performing a specific religious ceremony or ritual. °· Massage. °· Nutrition  therapy. °· Physical and occupational therapy. °· Short-term inpatient care, if something cannot be managed in the home. °· Art or music therapy. °· Bereavement support for grieving family members. °When should hospice care begin? °Most people who use hospice are believed to have less than 6 months to live. °· Your family and health care providers can help you decide when hospice services should begin. °· If you live longer than 6 months but your condition does not improve, your doctor may be able to approve you for continued hospice care. °· If your condition improves, you may discontinue the program. °What should I consider before selecting a program? °Most hospice programs are run by nonprofit, independent organizations. Some are affiliated with hospitals, nursing homes, or home health care agencies. Hospice programs can take place in your home or at a hospice center, hospital, or skilled nursing facility. When choosing a hospice program, ask the following questions: °· What services are available to me? °· What services will be offered to my loved ones? °· How involved will my loved ones be? °· How involved will my health care provider be? °· Who makes up the hospice care team? How are they trained or screened? °· How will my pain and symptoms be managed? °· If my circumstances change, can the services be provided in a different setting, such as my home or in the hospital? °· Is the program reviewed and licensed by the state or certified in some other way? °· What does it cost? Is it covered by insurance? °· If I choose a hospice   center or nursing home, where is the hospice center located? Is it convenient for family and friends? °· If I choose a hospice center or nursing home, can my family and friends visit any time? °· Will you provide emotional and spiritual support? °· Who can my family call with questions? °Where can I learn more about hospice? °You can learn about existing hospice programs in your area  from your health care providers. You can also read more about hospice online. The websites of the following organizations have helpful information: °· National Hospice and Palliative Care Organization (NHPCO): www.nhpco.org °· National Association for Home Care & Hospice (NAHC): www.nahc.org °· Hospice Foundation of America (HFA): www.hospicefoundation.org °· American Cancer Society (ACS): www.cancer.org °· Hospice Net: www.hospicenet.org °· Visiting Nurse Associations of America (VNAA): www.vnaa.org °You may also find more information by contacting the following agencies: °· A local agency on aging. °· Your local United Way chapter. °· Your state's department of health or social services. °Summary °· Hospice is a service that is designed to provide people who are terminally ill and their families with medical, spiritual, and psychological support. °· Hospice aims to improve your quality of life by keeping you as comfortable as possible in the final stages of life. °· Hospice teams often include a doctor, nurse, social worker, counselor, religious leader,dietitian, therapists, and volunteers. °· Hospice care generally includes medicine for symptom management, visits from doctors and nurses, physical and occupational therapy, nutrition counseling, spiritual and emotional counseling, caregiver support, and bereavement support for grieving family members. °· Hospice programs can take place in your home or at a hospice center, hospital, or skilled nursing facility. °This information is not intended to replace advice given to you by your health care provider. Make sure you discuss any questions you have with your health care provider. °Document Released: 11/03/2003 Document Revised: 08/08/2016 Document Reviewed: 08/08/2016 °Elsevier Interactive Patient Education © 2019 [employer] Inc. ° °

## 2018-11-29 NOTE — Plan of Care (Signed)
Patient is going home with Hospice care. Sent by [facility] in stable condition. [facility] given paperwork in packet with signed DNR form

## 2018-11-29 NOTE — Discharge Summary (Signed)
Physician Discharge Summary  Warren Blankenship OFB:[id number] DOB: 04-26-1956 DOA: 11/24/2018  PCP: Patient, No Pcp Per  Admit date: 11/24/2018 Discharge date: 11/29/2018  Time spent: 45 minutes  Recommendations for Outpatient Follow-up:  Patient will be discharged to home with hospice.  Patient should follow a regular/comfort diet.   Discharge Diagnoses:  Principal Problem: Hypotension Failure to thrive in adult Stage III squamous cell carcinoma of right lung  Normocytic normochromic anemia/ Anemia of chronic disease and malignancy Severe protein-calorie malnutrition  Nicotine Abuse  Left wrist fracture Bilateral nail changes Oral thrush Emphysema  Goals of care  Discharge Condition: Stable  Diet recommendation: regular/comfort  Filed Weights   11/24/18 1639  Weight: 46.3 kg    History of present illness:  on 11/24/2018 by Dr. Cheri Rous This is a 63 year old man with medical problems including stage III non-small cell lung cancer status post chemotherapy plus radiation now on durvalumab(started 04/2018),as well as ongoing smoking presenting with fatigue.  The patient reports living at home by himself, but his son comes daily to assist with his ADLs particularly with bathing. Reports he has required ADL assistance for the past few months. He walks with a walker. He spends over half his time in bed or at rest due to fatigue. He reports the fatigue has gotten worse over the past 1 to 2 weeks, he was unable to get out of bed which prompted him to seek medical care. Associated symptoms include dizziness as well as left wrist pain. He missed his last 2 treatments of durvalumab due to oversleeping as well as having a fall. Reports decreased p.o. intake due to lack of appetite, denies nausea or vomiting. He denies fever, chest pain, shortness of breath, hemoptysis, bleeding per rectum.  Last seen in the outpatient oncology clinic on October 21, 2018, at that time heart rate  782, systolic blood pressure 423. The patient reports chronic skin and nail changes. Reports left wrist pain. Most recent CT scan of the chest on October 23, 2018 revealed new left supraclavicular node as well as left adrenal mass consistent with metastatic disease. The patient currently reports he thinks that his cancer is doing well and responding to treatment appropriately. It appears the patient was evaluated on November 19, 2018 for generalized weakness and was given IV fluids in the emergency department with some improvement and was able to be discharged home.  Hospital Course:  Hypotension  -In the setting of non-small cell lung cancer, failure to thrive and protein calorie malnutrition -Patient was placed on IV fluid hydration -Lactic acid was normal, procalcitonin less than 0.1, cortisol level normal, no leukocytosis -Patient was placed on broad-spectrum antibiotics -Blood cultures show no growth to date, UA unremarkable for infection -Chest x-ray showed hyperinflation and emphysematous disease but no infection  Failure to thrive in adult -Likely due to severe protein calorie malnutrition, generalized deconditioning, malignancy -PT recommended SNF  Stage III squamous cell carcinoma of right lung  -poorly differentiated on biopsy  -Recent CT March 2020 had shown improvement in the right hemithorax, but progression/metastasis in the left adrenal and left supraclavicular region -Patient has missed a few durvalumab treatments due to failure to thrive -Oncology consulted and appreciated -Palliative medicine consulted, patient transitioned to DNR and plan for home with hospice  Normocytic normochromic anemia/ Anemia of chronic disease and malignancy -Stable -Anemia panel showed iron 17, ferritin 657, saturation ration 16%  Severe protein-calorie malnutrition  -Gomez <60% of standard weight -dietitian consulted   Nicotine Abuse  -Declined  nicotine patch  Left wrist fracture  -continue brace  Bilateral nail changes -hyperkeratotic lesions on both fingers, patient has ongoing radiation and chemotherapy treatments  -wound care consulted  Oral thrush -completed nystatin for 5 days  Emphysema  -Currently no wheezing, continue inhalers  Goals of care -Palliative care consulted and appreciated- recommended:Tylenol 650 mg po every 6 hours prn mild pain. OxyIR 2.5 mg po every 4 hours prn moderate pain. Roxanol 5 mg SL every 4 hours prn severe pain or SOB -Code status changed to DNR -Family plans to take patient home with hospice but would like to get things in order  Procedures: None   Consultations: Palliative   Discharge Exam: Vitals:   11/28/18 2047 11/29/18 0538  BP: (!) 102/59 (!) 91/52  Pulse: 90 94  Resp: 20 20  Temp: 99.4 F (37.4 C) 98.8 F (37.1 C)  SpO2: 99% 96%     General: Well developed, chronically ill appearing, cachetic, NAD  HEENT: NCAT, mucous membranes moist.  Cardiovascular: S1 S2 auscultated, RRR  Respiratory: Clear to auscultation bilaterally   Abdomen: Soft, nontender, nondistended, + bowel sounds  Extremities: warm dry without cyanosis clubbing or edema  Discharge Instructions  Allergies as of 11/29/2018   No Known Allergies     Medication List    STOP taking these medications   prochlorperazine 10 MG tablet Commonly known as:  COMPAZINE   umeclidinium-vilanterol 62.5-25 MCG/INH Aepb Commonly known as:  Anoro Ellipta     TAKE these medications   acetaminophen 325 MG tablet Commonly known as:  TYLENOL Take 2 tablets (650 mg total) by mouth every 6 (six) hours as needed for mild pain. What changed:    reasons to take this  Another medication with the same name was removed. Continue taking this medication, and follow the directions you see here.   albuterol (2.5 MG/3ML) 0.083% nebulizer solution Commonly known as:  PROVENTIL Take 3 mLs (2.5 mg total) by nebulization every 4 (four) hours as needed  for wheezing or shortness of breath.   chlorhexidine 4 % external liquid Commonly known as:  Hibiclens Apply topically daily as needed. Mix with warm water in 50/50 ratio and soak fingernails and toenails twice daily What changed:    how much to take  reasons to take this   feeding supplement (ENSURE ENLIVE) Liqd Take 237 mLs by mouth 2 (two) times daily between meals for 30 days. What changed:  when to take this   morphine CONCENTRATE 10 MG/0.5ML Soln concentrated solution Place 0.25 mLs (5 mg total) under the tongue every 4 (four) hours as needed for severe pain or shortness of breath.   multivitamin with minerals Tabs tablet Take 1 tablet by mouth daily.   mupirocin ointment 2 % Commonly known as:  Bactroban Apply to the area around the toe and finger nails three times daily   oxyCODONE 5 MG immediate release tablet Commonly known as:  Oxy IR/ROXICODONE Take 0.5 tablets (2.5 mg total) by mouth every 4 (four) hours as needed for moderate pain.   polyethylene glycol 17 g packet Commonly known as:  MIRALAX / GLYCOLAX Take 17 g by mouth daily as needed for mild constipation.      No Known Allergies    The results of significant diagnostics from this hospitalization (including imaging, microbiology, ancillary and laboratory) are listed below for reference.    Significant Diagnostic Studies: Dg Chest 2 View  Result Date: 11/24/2018 CLINICAL DATA:  Generalized weakness EXAM: CHEST - 2 VIEW COMPARISON:  11/19/2018, CT 10/23/2018, PET-CT 05/10/2018 FINDINGS: Hyperinflated lungs with emphysematous disease. Decreased right hilar mass compared to prior radiograph. No acute consolidation or effusion. Normal heart size. Aortic atherosclerosis. No pneumothorax. IMPRESSION: No active cardiopulmonary disease. Hyperinflation and emphysematous disease. Decreased right hilar mass as compared with prior radiograph Electronically Signed   By: Donavan Foil M.D.   On: 11/24/2018 18:26   Dg  Chest 2 View  Result Date: 11/19/2018 CLINICAL DATA:  Generalized weakness, history of lung carcinoma EXAM: CHEST - 2 VIEW COMPARISON:  10/23/2018 FINDINGS: Cardiac shadows within normal limits. Aortic calcifications are again seen. The lungs are mildly hyperinflated consistent with COPD. No focal infiltrate or sizable effusion is noted. No acute bony abnormality is seen. IMPRESSION: COPD without acute abnormality. Electronically Signed   By: Inez Catalina M.D.   On: 11/19/2018 13:00   Dg Wrist Complete Left  Result Date: 11/24/2018 CLINICAL DATA:  Fall with generalized wrist pain EXAM: LEFT WRIST - COMPLETE 3+ VIEW COMPARISON:  None. FINDINGS: Bones appear osteopenic.  Advanced visi deformity on lateral view. IMPRESSION: 1. Possible subtle nondisplaced ulnar styloid process fracture 2. Widened scapholunate interval consistent with ligamentous abnormality. Possible visi deformity though with nonstandard positioning on lateral view. 3. Osteopenia.  Advanced arthritis at the first [facility] joint Electronically Signed   By: Donavan Foil M.D.   On: 11/24/2018 17:44    Microbiology: Recent Results (from the past 240 hour(s))  Culture, blood (Routine x 2)     Status: None (Preliminary result)   Collection Time: 11/24/18  5:28 PM  Result Value Ref Range Status   Specimen Description   Final    BLOOD RIGHT HAND Performed at [facility] [address], [place], [place] [address]    Special Requests   Final    BOTTLES DRAWN AEROBIC ONLY Blood Culture adequate volume Performed at [facility] [address] [place], [place] [address]    Culture   Final    NO GROWTH 4 DAYS Performed at [facility], [place] [address] [place], [place] [address]    Report Status PENDING  Incomplete  Culture, blood (Routine x 2)     Status: None (Preliminary result)   Collection Time: 11/24/18  5:29 PM  Result Value Ref Range Status   Specimen Description   Final    BLOOD LEFT  ARM Performed at [facility] [address], [place], [place] [address]    Special Requests   Final    BOTTLES DRAWN AEROBIC AND ANAEROBIC Blood Culture results may not be optimal due to an excessive volume of blood received in culture bottles Performed at [facility] [address] [place], [place] [address]    Culture   Final    NO GROWTH 4 DAYS Performed at [facility], [place] [address], [place], [place] [address]    Report Status PENDING  Incomplete  Urine culture     Status: None   Collection Time: 11/24/18  7:52 PM  Result Value Ref Range Status   Specimen Description   Final    URINE, RANDOM Performed at [facility], [place] [address] [place], [place] [address]    Special Requests   Final    NONE Performed at [facility], [place] [address], [place], [place] [address]    Culture   Final    NO GROWTH Performed at [facility], [place] [address], [place], [place] [address]  Report Status 11/25/2018 FINAL  Final     Labs: Basic Metabolic Panel: Recent Labs  Lab 11/24/18 1725 11/24/18 1925 11/25/18 0321 11/26/18 0414 11/27/18 0358 11/28/18 0423  NA 135  134*  --  135 132* 132* 129*  K 3.7  3.7  --  3.8 3.6 3.6 3.6  CL 102  100  --  104 98 97* 94*  CO2 25  25  --  22 24 25 25   GLUCOSE 122*  121*  --  79 66* 83 98  BUN 20  21  --  13 12 8  7*  CREATININE 0.62  0.63  --  0.50* 0.63 0.62 0.66  CALCIUM 8.2*  8.2*  --  8.1* 8.4* 8.4* 8.2*  MG  --  1.8  --   --   --   --   PHOS  --  3.4  --   --   --   --    Liver Function Tests: Recent Labs  Lab 11/24/18 1725 11/25/18 0321  AST 23 20  ALT 19 18  ALKPHOS 85 82  BILITOT 0.2* 0.6  PROT 6.3* 5.9*  ALBUMIN 2.2* 2.1*   No results for input(s): LIPASE, AMYLASE in the last 168 hours. Recent Labs  Lab 11/26/18 1454  AMMONIA 16   CBC: Recent Labs  Lab 11/24/18 1723 11/25/18 0321 11/26/18 0414 11/27/18 0358 11/28/18 0423   WBC 6.1 7.1 7.7 5.7 5.4  NEUTROABS 4.6  --   --   --   --   HGB 8.0* 7.8* 8.5* 8.5* 8.3*  HCT 26.7* 26.0* 28.2* 28.0* 28.7*  MCV 91.4 90.9 90.4 90.0 91.1  PLT 396 363 385 383 349   Cardiac Enzymes: No results for input(s): CKTOTAL, CKMB, CKMBINDEX, TROPONINI in the last 168 hours. BNP: BNP (last 3 results) No results for input(s): BNP in the last 8760 hours.  ProBNP (last 3 results) No results for input(s): PROBNP in the last 8760 hours.  CBG: Recent Labs  Lab 11/27/18 1709 11/28/18 0802 11/28/18 1142 11/28/18 2052 11/29/18 0735  GLUCAP 105* 109* 116* 87 102*       Signed:  Tahja Liao  [facility] 11/29/2018, 9:11 AM

## 2018-11-29 NOTE — TOC Transition Note (Signed)
Transition of Care [facility]) - CM/SW Discharge Note   Patient Details  Name: Warren Blankenship MRN: [id number] Date of Birth: 1955/10/30  Transition of Care [facility]) CM/SW Contact:  Nila Nephew, LCSW Phone Number: [phone] 11/29/2018, 11:15 AM   Clinical Narrative:   Pt discharging home today with hospice ([facility]). Spoke with pt's daughter Jane-Pt's family at pt's home awaiting DME delivery- CSW will arrange [facility] transportation home for pt this afternoon.     Barriers to Discharge: no barriers      Discharge Plan and Services     Post Acute Care Choice: Home/Hospice                               Social Determinants of Health (SDOH) Interventions     Readmission Risk Interventions No flowsheet data found.

## 2018-11-29 NOTE — Progress Notes (Signed)
   11/29/18 1131  Clinical Encounter Type  Visited With Patient  Visit Type Initial  Referral From Palliative care team  Consult/Referral To Chaplain  This chaplain responded to PMT request for Pt. Spiritual care.  The chaplain read the Pt. chart and checked in with the RN-Mandy before the visit. The chaplain was pastorally present with the Pt..The chaplain  listened and heard the Pt. loves his 4 children.  The Pt. preferred personal prayer by the chaplain and shared well wishes for the chaplain.  This chaplain understands the Pt. will transition home with hospice this afternoon.

## 2018-12-05 ENCOUNTER — Ambulatory Visit: Payer: Self-pay

## 2018-12-05 ENCOUNTER — Ambulatory Visit: Payer: Self-pay | Admitting: Physician Assistant

## 2018-12-05 ENCOUNTER — Other Ambulatory Visit: Payer: Self-pay

## 2018-12-30 DEATH — deceased

## 2019-02-03 ENCOUNTER — Ambulatory Visit: Payer: Self-pay | Admitting: Pulmonary Disease

## 2019-08-19 IMAGING — CT CT CHEST W/ CM
2 of 4 series · 15 of 36 positions shown, 18 images · IV contrast (omnipaque)
Comparison: PET-CT dated 05/10/2018.  CT chest dated 04/12/2018.

CLINICAL DATA: Stage IIIB non-small cell lung cancer

EXAM:
CT CHEST WITH CONTRAST
TECHNIQUE: Multidetector CT imaging of the chest was performed during
intravenous contrast administration.
CONTRAST:  75mL OMNIPAQUE IOHEXOL 300 MG/ML  SOLN

[Series 2: axial st · axial · 0.74mm/px · z∈[+1481,+1767]mm · 12 of 171 slices shown, 15 images]
[im 14/171  mediastinal]
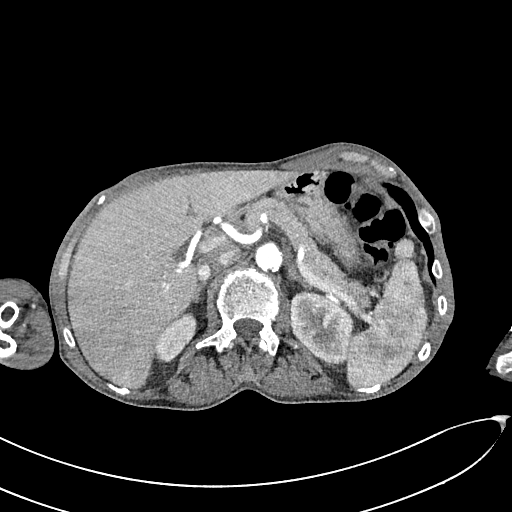
[im 14/171  lung]
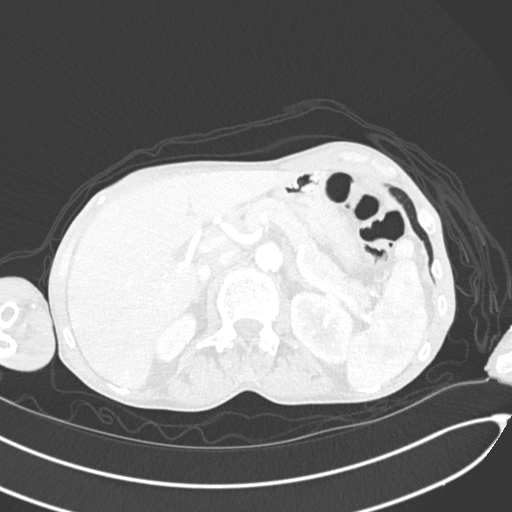
[im 27/171  lung]
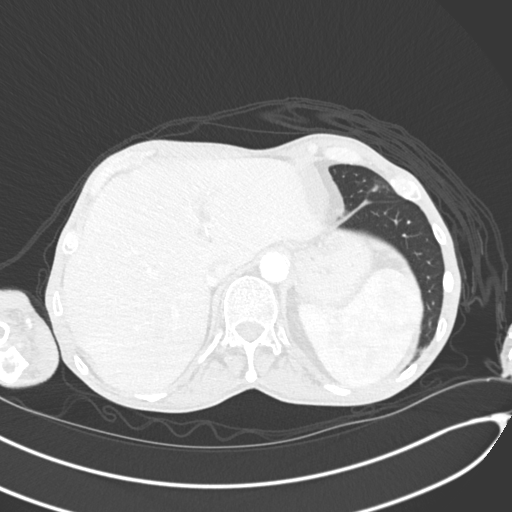
[im 40/171  lung]
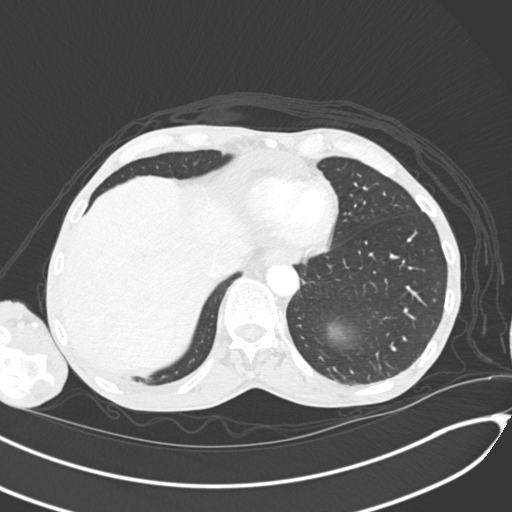
[im 53/171  lung]
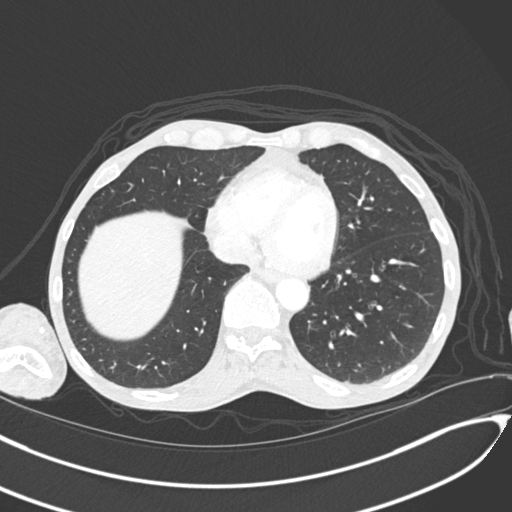
[im 66/171  mediastinal]
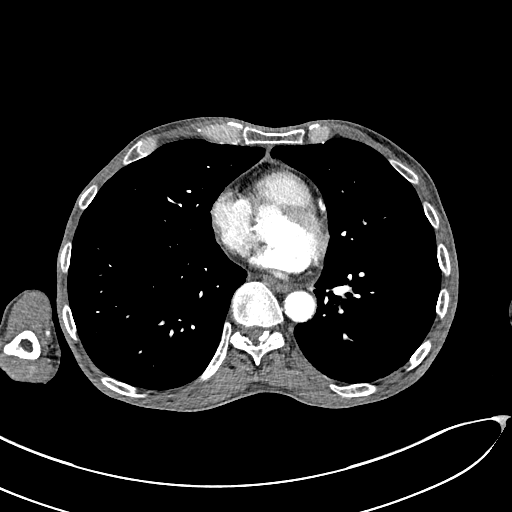
[im 66/171  lung]
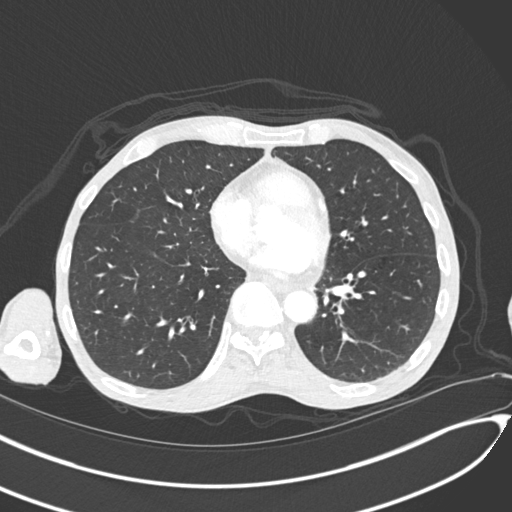
[im 79/171  lung]
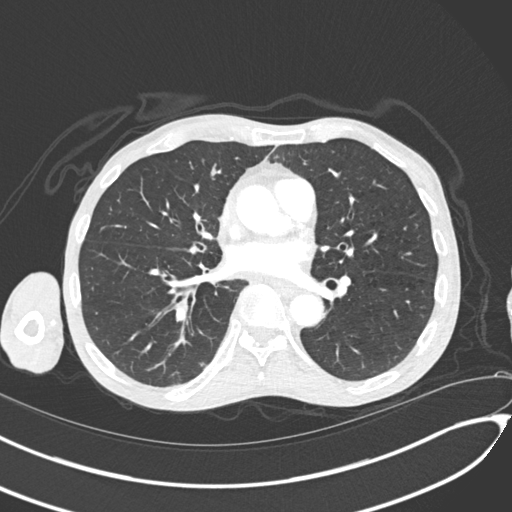
[im 92/171  lung]
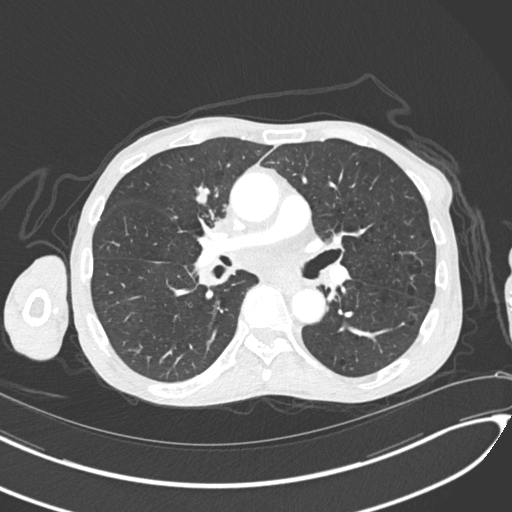
[im 105/171  lung]
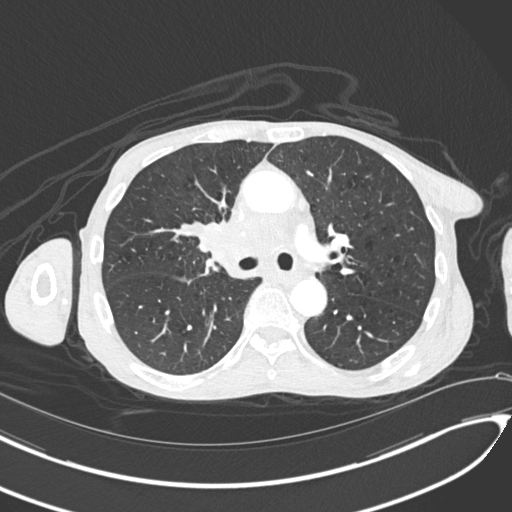
[im 118/171  mediastinal]
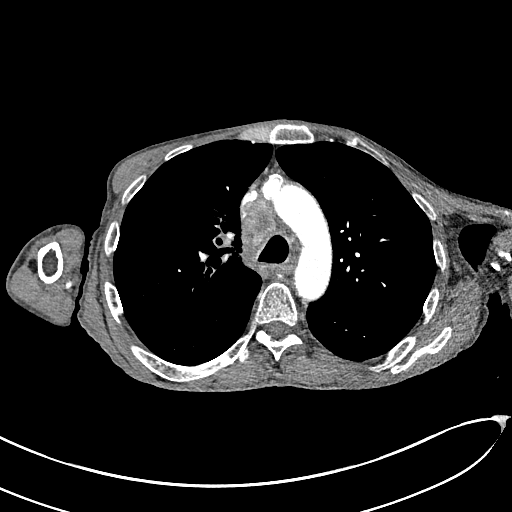
[im 118/171  lung]
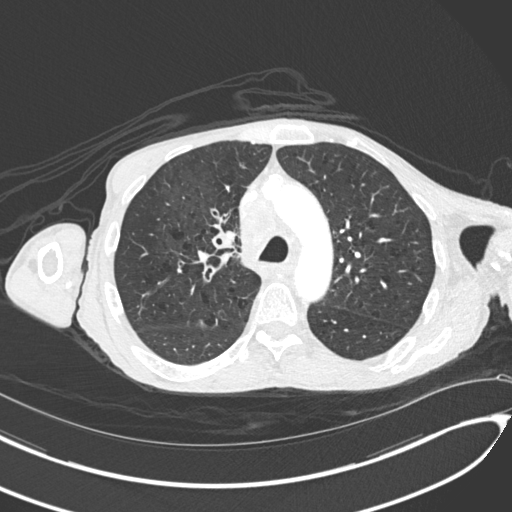
[im 131/171  lung]
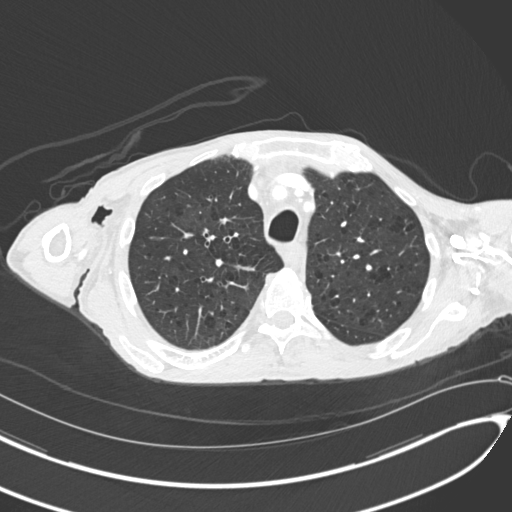
[im 144/171  lung]
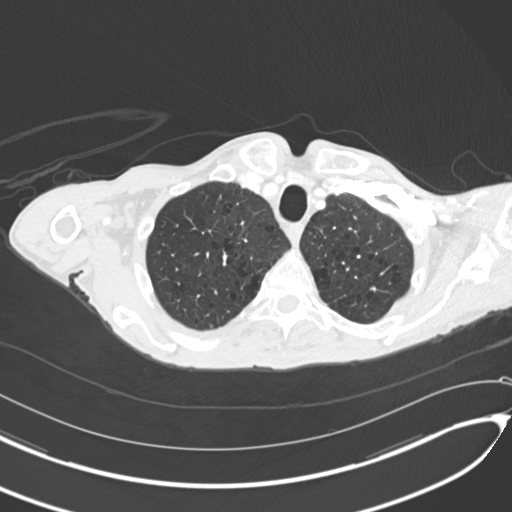
[im 157/171  lung]
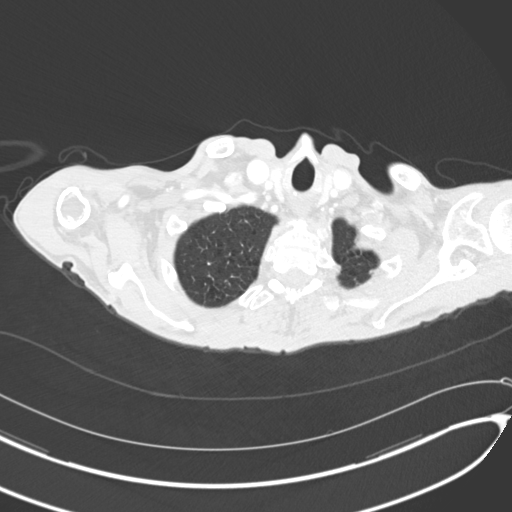

[Series 5: coronal · coronal · 0.79mm/px · 3 of 107 slices shown]
[im 22/107  lung]
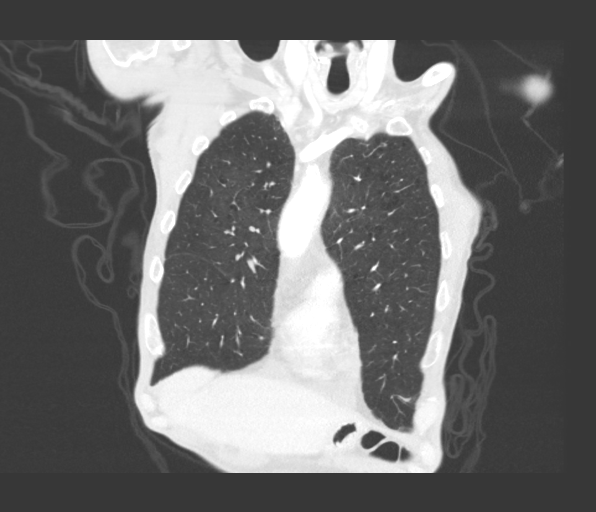
[im 43/107  lung]
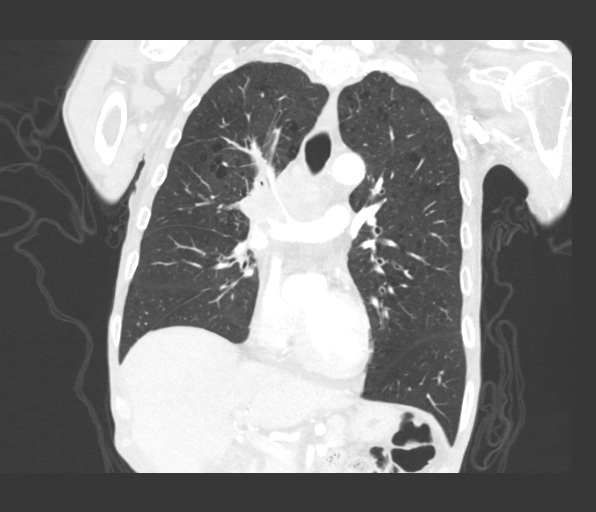
[im 64/107  lung]
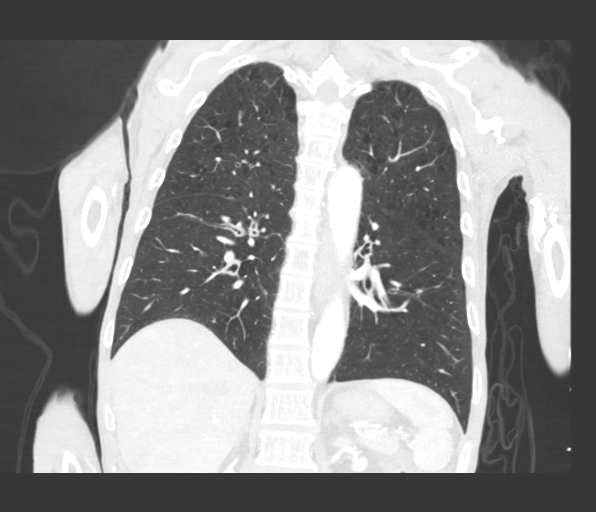

[15 of 36 positions shown; findings below may reference images not displayed]

FINDINGS: Cardiovascular: The heart is normal in size. Small volume anterior
pericardial effusion.

No evidence of thoracic aortic aneurysm. Mild atherosclerotic
calcifications of the aortic arch.

Coronary atherosclerosis of the LAD.

Mediastinum/Nodes: Bulky thoracic nodal metastases, improved from
prior CT chest, including:

--3.0 cm short axis right paratracheal node (series 2/image 87),
previously 3.8 cm

--2.7 cm short axis low right paratracheal node (series 2/image 87),
previously 4.0 cm when measured discretely

--2.5 cm short axis right hilar node (series 2/image 67), previously
3.5 cm when measured discretely

Visualized thyroid is unremarkable.

Lungs/Pleura: Right hilar/suprahilar mass described above leads to
narrowing of right upper lobe bronchi with peribronchial thickening
and postobstructive nodularity in the medial right upper lobe
(series 7/image 77). This appearance is similar to the prior.

Mild centrilobular and paraseptal emphysematous changes, upper lobe
predominant.

No focal consolidation.

No pleural effusion or pneumothorax.

Upper Abdomen: Visualized upper abdomen is grossly unremarkable.

Musculoskeletal: Visualized osseous structures are within normal
limits.
IMPRESSION: Improving bulky thoracic lymphadenopathy, as above.

Mild postobstructive opacity in the suprahilar region/medial right
upper lobe, similar.

Aortic Atherosclerosis (B4VKW-OE4.4) and Emphysema ([id number]-[id number].U).

## 2019-11-26 IMAGING — CT CT CHEST WITH CONTRAST
2 of 4 series · 15 of 36 positions shown, 18 images · IV contrast (OMNIPAQUE)
Comparison: CT 07/16/2018 and PET-CT 05/10/2018.

CLINICAL DATA: Stage IIIB non-small-cell poorly differentiated
carcinoma favoring adenocarcinoma diagnosed in March 2018.
Chemotherapy in progress. Radiation therapy completed.

EXAM:
CT CHEST WITH CONTRAST
TECHNIQUE: Multidetector CT imaging of the chest was performed during
intravenous contrast administration.
CONTRAST:  75mL OMNIPAQUE IOHEXOL 300 MG/ML  SOLN

[Series 2: axial st · axial · 0.67mm/px · z∈[-332,-20]mm · 12 of 184 slices shown, 15 images]
[im 14/184  mediastinal]
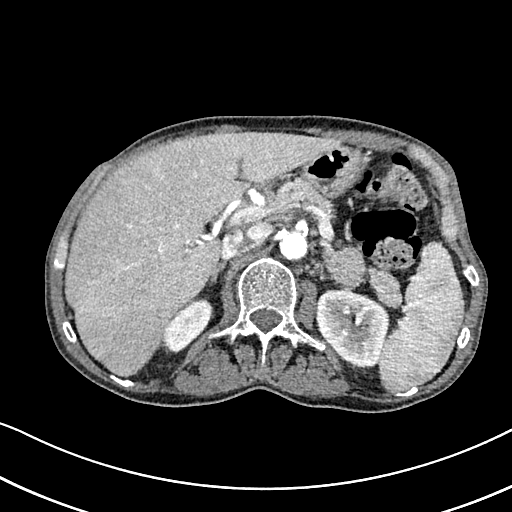
[im 14/184  lung]
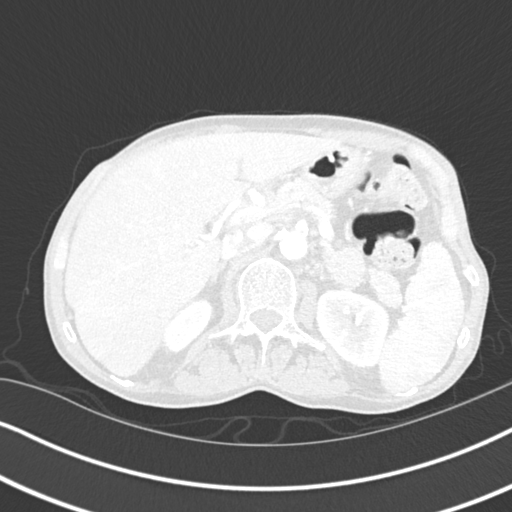
[im 27/184  lung]
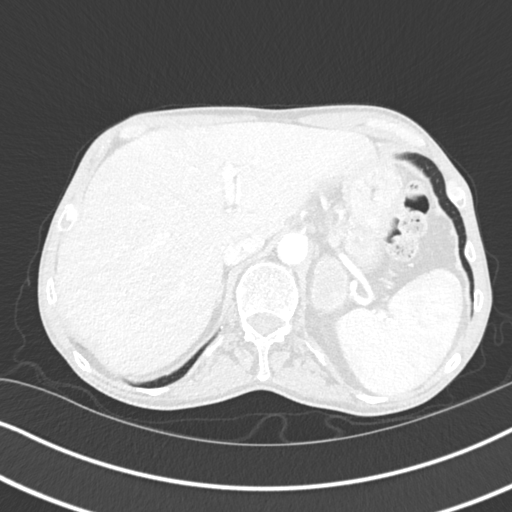
[im 40/184  lung]
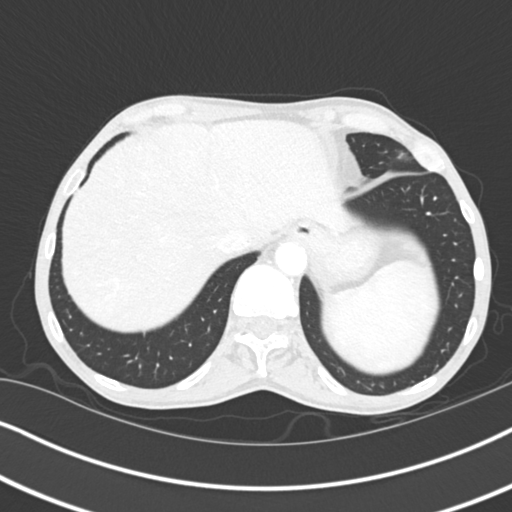
[im 53/184  lung]
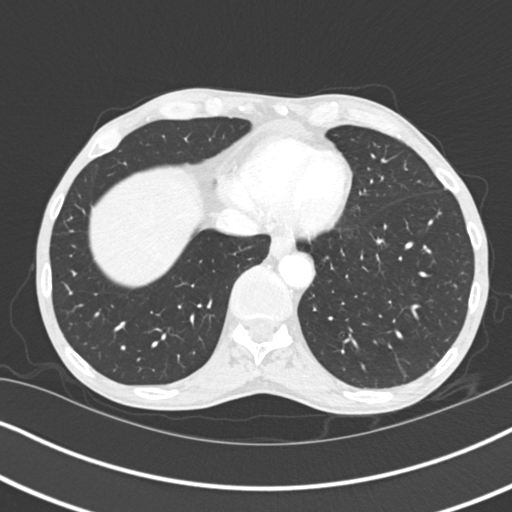
[im 66/184  mediastinal]
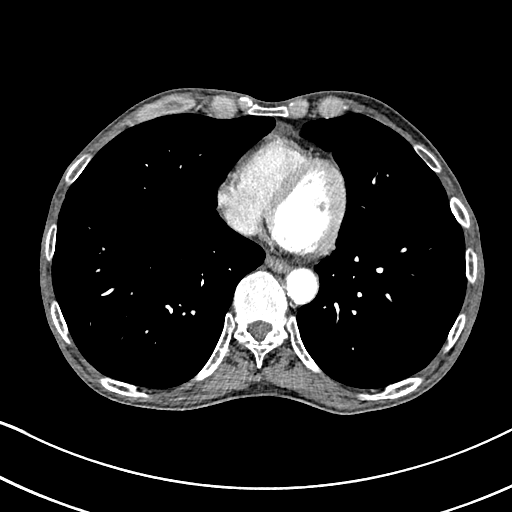
[im 66/184  lung]
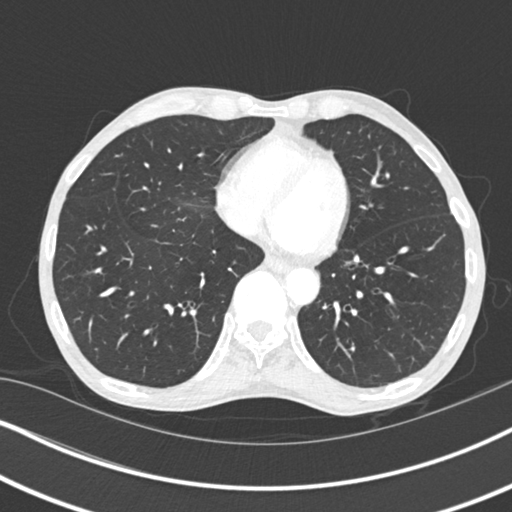
[im 79/184  lung]
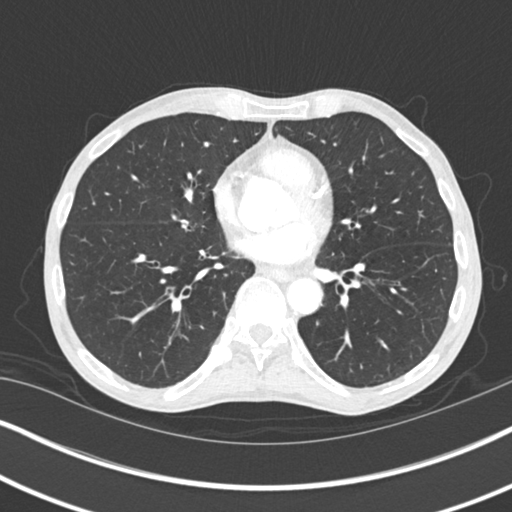
[im 105/184  lung]
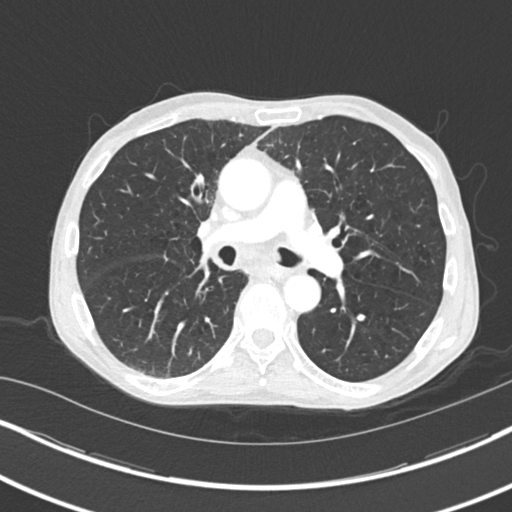
[im 118/184  lung]
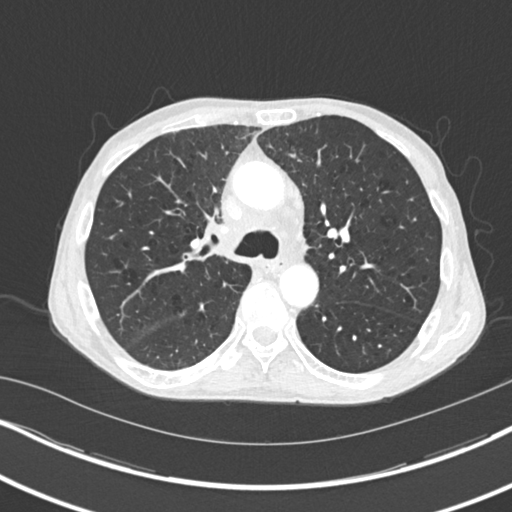
[im 131/184  mediastinal]
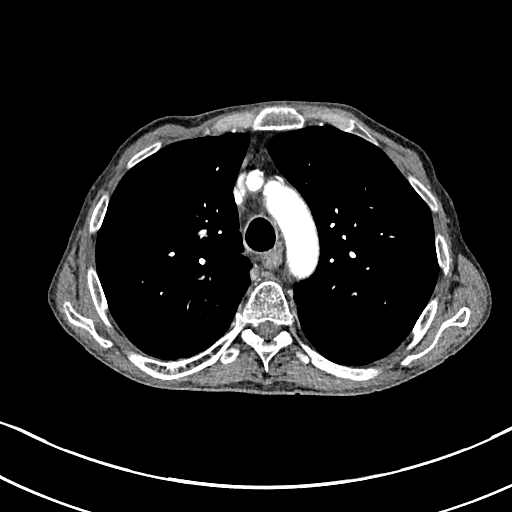
[im 131/184  lung]
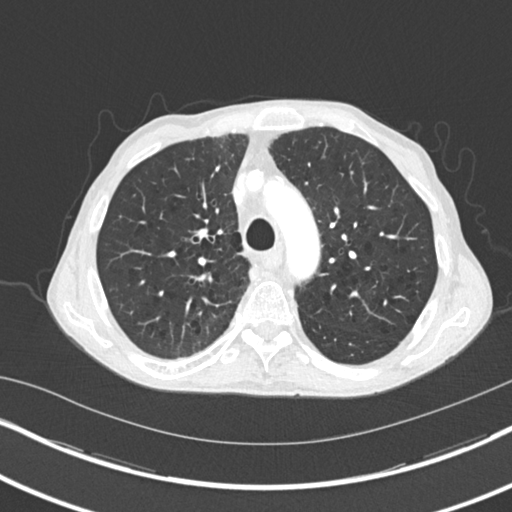
[im 144/184  lung]
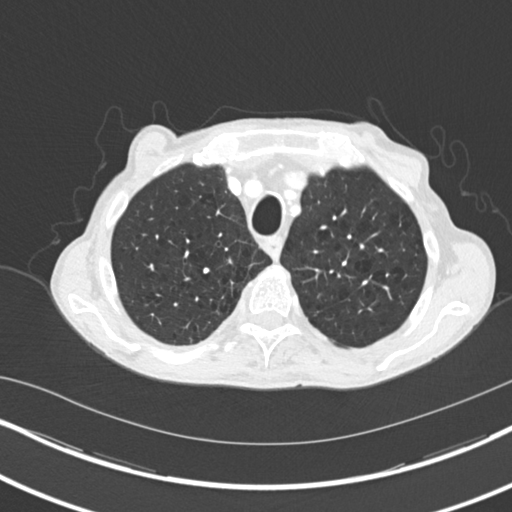
[im 157/184  lung]
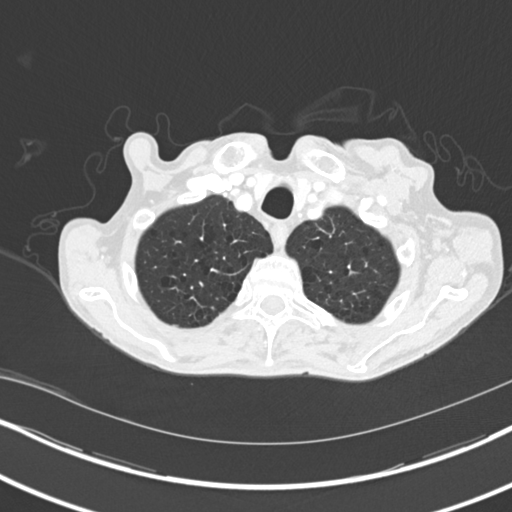
[im 170/184  lung]
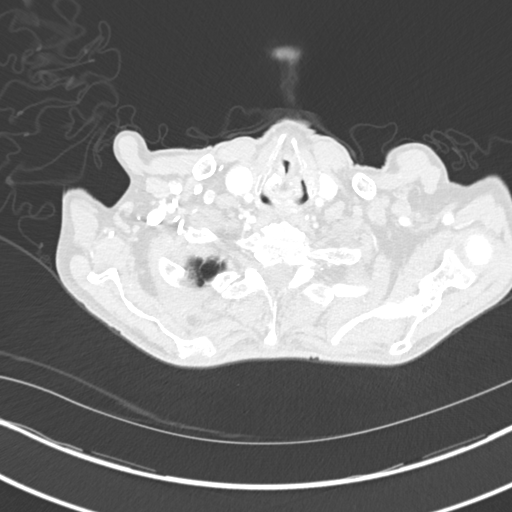

[Series 5: coronal · coronal · 0.74mm/px · 3 of 126 slices shown]
[im 26/126  lung]
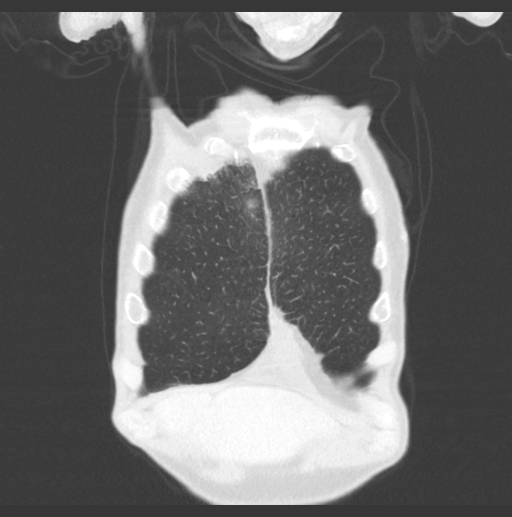
[im 51/126  lung]
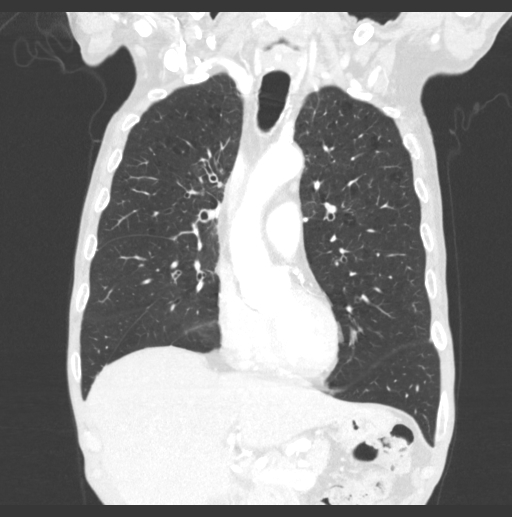
[im 76/126  lung]
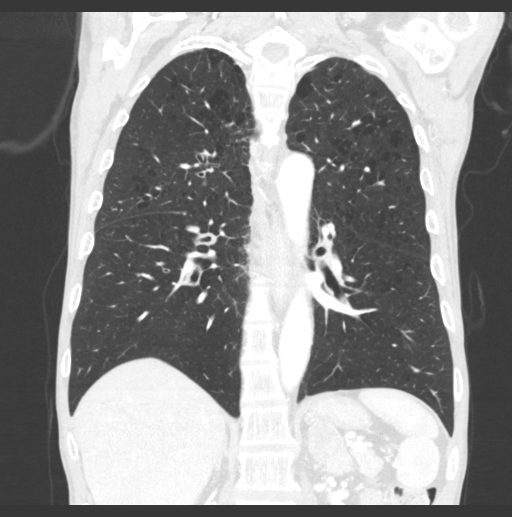

[15 of 36 positions shown; findings below may reference images not displayed]

FINDINGS: Cardiovascular: Moderate atherosclerosis of the aorta, great vessels
and coronary arteries. No acute vascular findings are demonstrated.
There is less extrinsic compression of the superior vena cava which
remains patent. The heart size is normal. A small pericardial
effusion appears unchanged in volume.

Mediastinum/Nodes: There is a new enlarged left supraclavicular
node, measuring 15 mm on image [DATE]. This is incompletely visualized.
Previously demonstrated mediastinal and right hilar adenopathy has
significantly improved. There is residual irregular confluent soft
tissue in these areas with less mass effect on the tracheobronchial
tree and pulmonary vessels. No discrete residual mediastinal
adenopathy. The thyroid gland, trachea and esophagus demonstrate no
significant findings.

Lungs/Pleura: There is no pleural effusion or pneumothorax.
Mild-to-moderate centrilobular and paraseptal emphysema again noted
with mild changes of external beam radiation medially in the right
upper lobe. There is no residual measurable right suprahilar mass or
right upper lobe bronchial occlusion. 4 mm right upper lobe nodule
on image 32/7 is stable. No new or enlarging nodules.

Upper abdomen: There is a new large left adrenal mass measuring up
to 4.0 x 2.7 cm on axial image 159/2. This has low-density component
superiorly which appear partially necrotic. There is an inferior
component with higher density. This measures up to 4.2 cm on coronal
image 73/5. The right adrenal gland and visualized liver appear
unremarkable.

Musculoskeletal/Chest wall: There is no chest wall mass or
suspicious osseous finding. Old rib fractures on the left. Paucity
of thoracic fat.
IMPRESSION: 1. Significant interval response to treatment in the right
suprahilar mass and mediastinal lymphadenopathy. There is residual
confluent soft tissue in the right hilar and paratracheal regions
without discrete adenopathy.
2. However, new left supraclavicular node and left adrenal mass
consistent with metastatic disease. The left supraclavicular node is
incompletely visualized by this examination, but is likely amenable
to biopsy under ultrasound if necessary.
3. Interval improved mass effect on the tracheobronchial tree and
central vasculature.
4. Coronary artery, Aortic Atherosclerosis ([id number]) and
Emphysema ([id number]).
